# Patient Record
Sex: Male | Born: 1940 | Race: White | Hispanic: No | Marital: Married | Smoking: Never smoker
Health system: Southern US, Community
[De-identification: ages and names within clinical notes are randomized; demographics above are authoritative.]

## PROBLEM LIST (undated history)

## (undated) DIAGNOSIS — I2510 Atherosclerotic heart disease of native coronary artery without angina pectoris: Secondary | ICD-10-CM

## (undated) DIAGNOSIS — I480 Paroxysmal atrial fibrillation: Secondary | ICD-10-CM

## (undated) DIAGNOSIS — Z961 Presence of intraocular lens: Secondary | ICD-10-CM

## (undated) DIAGNOSIS — Z7901 Long term (current) use of anticoagulants: Secondary | ICD-10-CM

## (undated) DIAGNOSIS — H3320 Serous retinal detachment, unspecified eye: Secondary | ICD-10-CM

## (undated) DIAGNOSIS — E785 Hyperlipidemia, unspecified: Secondary | ICD-10-CM

## (undated) DIAGNOSIS — I4892 Unspecified atrial flutter: Secondary | ICD-10-CM

## (undated) DIAGNOSIS — I10 Essential (primary) hypertension: Secondary | ICD-10-CM

## (undated) DIAGNOSIS — IMO0001 Reserved for inherently not codable concepts without codable children: Secondary | ICD-10-CM

## (undated) DIAGNOSIS — I219 Acute myocardial infarction, unspecified: Secondary | ICD-10-CM

## (undated) HISTORY — PX: TONSILLECTOMY AND ADENOIDECTOMY: SUR1326

## (undated) HISTORY — DX: Long term (current) use of anticoagulants: Z79.01

## (undated) HISTORY — DX: Paroxysmal atrial fibrillation: I48.0

## (undated) HISTORY — DX: Hyperlipidemia, unspecified: E78.5

## (undated) HISTORY — DX: Atherosclerotic heart disease of native coronary artery without angina pectoris: I25.10

## (undated) HISTORY — DX: Unspecified atrial flutter: I48.92

## (undated) HISTORY — DX: Presence of intraocular lens: Z96.1

## (undated) HISTORY — DX: Acute myocardial infarction, unspecified: I21.9

## (undated) HISTORY — DX: Essential (primary) hypertension: I10

## (undated) HISTORY — DX: Serous retinal detachment, unspecified eye: H33.20

## (undated) HISTORY — DX: Reserved for inherently not codable concepts without codable children: IMO0001

---

## 1993-12-24 DIAGNOSIS — I219 Acute myocardial infarction, unspecified: Secondary | ICD-10-CM

## 1993-12-24 HISTORY — DX: Acute myocardial infarction, unspecified: I21.9

## 1994-12-24 HISTORY — PX: CARDIAC CATHETERIZATION: SHX172

## 1994-12-24 HISTORY — PX: CORONARY ARTERY BYPASS GRAFT: SHX141

## 2004-09-15 ENCOUNTER — Ambulatory Visit (HOSPITAL_COMMUNITY): Admission: RE | Admit: 2004-09-15 | Discharge: 2004-09-16 | Payer: Self-pay | Admitting: Ophthalmology

## 2004-12-24 DIAGNOSIS — IMO0001 Reserved for inherently not codable concepts without codable children: Secondary | ICD-10-CM

## 2004-12-24 HISTORY — DX: Reserved for inherently not codable concepts without codable children: IMO0001

## 2006-12-15 ENCOUNTER — Emergency Department (HOSPITAL_COMMUNITY): Admission: EM | Admit: 2006-12-15 | Discharge: 2006-12-15 | Payer: Self-pay | Admitting: Emergency Medicine

## 2006-12-18 ENCOUNTER — Encounter (HOSPITAL_COMMUNITY): Admission: RE | Admit: 2006-12-18 | Discharge: 2007-03-05 | Payer: Self-pay | Admitting: Emergency Medicine

## 2006-12-22 ENCOUNTER — Emergency Department (HOSPITAL_COMMUNITY): Admission: EM | Admit: 2006-12-22 | Discharge: 2006-12-22 | Payer: Self-pay | Admitting: Emergency Medicine

## 2006-12-29 ENCOUNTER — Emergency Department (HOSPITAL_COMMUNITY): Admission: EM | Admit: 2006-12-29 | Discharge: 2006-12-29 | Payer: Self-pay | Admitting: Emergency Medicine

## 2007-01-12 ENCOUNTER — Emergency Department (HOSPITAL_COMMUNITY): Admission: EM | Admit: 2007-01-12 | Discharge: 2007-01-12 | Payer: Self-pay | Admitting: Emergency Medicine

## 2010-09-04 ENCOUNTER — Ambulatory Visit: Payer: Self-pay | Admitting: Cardiovascular Disease

## 2010-09-08 ENCOUNTER — Ambulatory Visit: Payer: Self-pay | Admitting: Cardiovascular Disease

## 2011-03-27 ENCOUNTER — Other Ambulatory Visit: Payer: Self-pay | Admitting: *Deleted

## 2011-03-27 ENCOUNTER — Telehealth: Payer: Self-pay | Admitting: Cardiovascular Disease

## 2011-03-27 MED ORDER — POTASSIUM CHLORIDE CRYS ER 20 MEQ PO TBCR: 20.0000 meq | EXTENDED_RELEASE_TABLET | Freq: Every day | ORAL | Status: DC

## 2011-03-27 NOTE — Telephone Encounter (Signed)
Requesting Klor-Con 20 meq #90-wants to pick up RX on Friday. Also wants nurse to look in his chart and call him to set up labs and ov.

## 2011-03-27 NOTE — Telephone Encounter (Signed)
Attempted to reach the pt to make an appt and lab work.  L/M to call back.

## 2011-03-27 NOTE — Telephone Encounter (Signed)
Pt called back for an appt. And 6mov .  This was done

## 2011-03-27 NOTE — Telephone Encounter (Signed)
Refill for potassium 20 meq #90;   Pt will pick up

## 2011-04-16 ENCOUNTER — Other Ambulatory Visit: Payer: Self-pay | Admitting: *Deleted

## 2011-04-16 DIAGNOSIS — E7800 Pure hypercholesterolemia, unspecified: Secondary | ICD-10-CM

## 2011-04-23 ENCOUNTER — Telehealth: Payer: Self-pay | Admitting: *Deleted

## 2011-04-23 ENCOUNTER — Other Ambulatory Visit (INDEPENDENT_AMBULATORY_CARE_PROVIDER_SITE_OTHER): Payer: PRIVATE HEALTH INSURANCE | Admitting: *Deleted

## 2011-04-23 DIAGNOSIS — E7800 Pure hypercholesterolemia, unspecified: Secondary | ICD-10-CM

## 2011-04-23 LAB — LIPID PANEL
Cholesterol: 122 mg/dL (ref 0–200)
VLDL: 21.4 mg/dL (ref 0.0–40.0)

## 2011-04-23 LAB — BASIC METABOLIC PANEL
BUN: 23 mg/dL (ref 6–23)
Calcium: 9.2 mg/dL (ref 8.4–10.5)
Chloride: 105 mEq/L (ref 96–112)
GFR: 76.27 mL/min (ref >60.00)
Glucose, Bld: 96 mg/dL (ref 70–99)
Potassium: 4.2 mEq/L (ref 3.5–5.1)

## 2011-04-23 LAB — HEPATIC FUNCTION PANEL
AST: 30 U/L (ref 0–37)
Alkaline Phosphatase: 67 U/L (ref 39–117)

## 2011-04-23 NOTE — Telephone Encounter (Signed)
Patient called with lab results. Xavier Jatniel Verastegui RN  

## 2011-04-23 NOTE — Telephone Encounter (Signed)
Message copied by Mahalia Longest on Mon Apr 23, 2011  4:59 PM ------      Message from: [place], [place]      Created: Mon Apr 23, 2011  3:02 PM       HDL remains a bit low.  Will discuss at his next ov.

## 2011-04-30 ENCOUNTER — Encounter: Payer: Self-pay | Admitting: Cardiovascular Disease

## 2011-04-30 DIAGNOSIS — I219 Acute myocardial infarction, unspecified: Secondary | ICD-10-CM | POA: Insufficient documentation

## 2011-04-30 DIAGNOSIS — I2510 Atherosclerotic heart disease of native coronary artery without angina pectoris: Secondary | ICD-10-CM | POA: Insufficient documentation

## 2011-04-30 DIAGNOSIS — E785 Hyperlipidemia, unspecified: Secondary | ICD-10-CM | POA: Insufficient documentation

## 2011-04-30 DIAGNOSIS — H3320 Serous retinal detachment, unspecified eye: Secondary | ICD-10-CM | POA: Insufficient documentation

## 2011-04-30 DIAGNOSIS — Z961 Presence of intraocular lens: Secondary | ICD-10-CM | POA: Insufficient documentation

## 2011-04-30 DIAGNOSIS — I480 Paroxysmal atrial fibrillation: Secondary | ICD-10-CM | POA: Insufficient documentation

## 2011-04-30 DIAGNOSIS — I4891 Unspecified atrial fibrillation: Secondary | ICD-10-CM | POA: Insufficient documentation

## 2011-04-30 DIAGNOSIS — I10 Essential (primary) hypertension: Secondary | ICD-10-CM | POA: Insufficient documentation

## 2011-05-04 ENCOUNTER — Ambulatory Visit (INDEPENDENT_AMBULATORY_CARE_PROVIDER_SITE_OTHER): Payer: PRIVATE HEALTH INSURANCE | Admitting: Cardiovascular Disease

## 2011-05-04 ENCOUNTER — Encounter: Payer: Self-pay | Admitting: Cardiovascular Disease

## 2011-05-04 VITALS — BP 138/82 | HR 70 | Wt 157.0 lb

## 2011-05-04 DIAGNOSIS — E785 Hyperlipidemia, unspecified: Secondary | ICD-10-CM

## 2011-05-04 DIAGNOSIS — I2510 Atherosclerotic heart disease of native coronary artery without angina pectoris: Secondary | ICD-10-CM

## 2011-05-04 NOTE — Assessment & Plan Note (Signed)
His coronary artery disease remained stable. His grafts are now 72 years old. We'll plan on doing a stress Myoview study in the next several years.

## 2011-05-04 NOTE — Assessment & Plan Note (Signed)
His most recent lipids his HDL still fairly well. I've encouraged him to exercise on a regular basis. I've asked him to take his Niaspan every night.

## 2011-05-04 NOTE — Progress Notes (Signed)
Xavier Bell Date of Birth  07/05/1941 [facility] [address]     [address] [place], [place]  [address] [phone]  Fax  [phone]  History of Present Illness:  Xavier Bell is a middle-age gentleman with a history of coronary artery disease. He is status post coronary artery bypass grafting in 1996. He is remaining quite active. [name] not had any episodes of chest pain or shortness of breath.  He has not been exercising as much as he would like. He works up in [place]. 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 He does stay fairly active. He also is not been taking his Niaspan as regularly as he would like because of the flushing that he gets.  Current Outpatient Prescriptions on File Prior to Visit  Medication Sig Dispense Refill  . amLODipine (NORVASC) 5 MG tablet Take 5 mg by mouth daily.        . Ascorbic Acid (VITAMIN C PO) Take by mouth daily.        [name] aspirin 325 MG tablet Take 325 mg by mouth daily.        [name] atorvastatin (LIPITOR) 40 MG tablet Take 40 mg by mouth daily.        . carvedilol (COREG) 12.5 MG tablet Take 12.5 mg by mouth 2 (two) times daily with a meal.        . Cholecalciferol (VITAMIN D) 1000 UNITS capsule Take 5,000 Units by mouth daily.        . fenofibrate (TRICOR) 145 MG tablet Take 145 mg by mouth daily.        . fish oil-omega-3 fatty acids 1000 MG capsule Take by mouth daily.        . hydrochlorothiazide 25 MG tablet Take 25 mg by mouth daily.        . Multiple Vitamin (MULTIVITAMIN) tablet Take 1 tablet by mouth daily.        . niacin (NIASPAN) 1000 MG CR tablet Take 1,000 mg by mouth at bedtime. Taking occ.      . potassium chloride SA (K-DUR,KLOR-CON) 20 MEQ tablet Take 1 tablet (20 mEq total) by mouth daily.  90 tablet  3  . telmisartan (MICARDIS) 80 MG tablet Take 80 mg by mouth daily.        . Zinc 50 MG CAPS Take by mouth daily.        [name] DISCONTD: potassium chloride (KLOR-CON) 20 MEQ  packet Take 20 mEq by mouth daily.          No Known Allergies  Past Medical History  Diagnosis Date  . Coronary artery disease   . Hypertension   . Dyslipidemia   . MI (myocardial infarction) 1995  . Retinal detachment   . Pseudoaphakia     both eyes  . Atrial fibrillation     Past Surgical History  Procedure Date  . Coronary artery bypass graft 1996  . Cardiac catheterization 1996  . Tonsillectomy and adenoidectomy     History  Smoking status  . Never Smoker   Smokeless tobacco  . Not on file    History  Alcohol Use No    Family History  Problem Relation Age of Onset  . Coronary artery disease      Reviw of Systems:  Reviewed in the HPI.  All other systems are negative.  Physical Exam: BP 138/82  Pulse 70  Wt 157 lb (71.215 kg) The patient is  alert and oriented x 3.  The mood and affect are normal.  The skin is warm and dry.  Color is normal.  The HEENT exam reveals that the sclera are nonicteric.  The mucous membranes are moist.  The carotids are 2+ without bruits.  There is no thyromegaly.  There is no JVD.  The lungs are clear.  The chest wall is non tender.  The heart exam reveals a regular rate with a normal S1 and S2.  There are no murmurs, gallops, or rubs.  The PMI is not displaced.   Abdominal exam reveals good bowel sounds.  There is no guarding or rebound.  There is no hepatosplenomegaly or tenderness.  There are no masses.  Exam of the legs reveal no clubbing, cyanosis, or edema.  The legs are without rashes.  The distal pulses are intact.  Cranial nerves II - XII are intact.  Motor and sensory functions are intact.  The gait is normal.  Assessment / Plan:

## 2011-05-11 NOTE — Op Note (Signed)
NAMEHENLEY, BOETTNER            ACCOUNT NO.:  [id number]   MEDICAL RECORD NO.:  [id number]          PATIENT TYPE:  OIB   LOCATION:  NA                           FACILITY:  [facility]   PHYSICIAN:  Guadelupe Sabin, M.D.DATE OF BIRTH:  July 21, 1941   DATE OF PROCEDURE:  09/15/2004  DATE OF DISCHARGE:                                 OPERATIVE REPORT   PREOPERATIVE DIAGNOSIS:  Rhegmatogenous retinal detachment, left eye.   POSTOPERATIVE DIAGNOSIS:  Rhegmatogenous retinal detachment, left eye.   OPERATION PERFORMED:  Scleral buckling procedure, left eye using solid  silicone implants, #279 and 240, with cryo application, diathermy  application and paracentesis.   SURGEON:  Guadelupe Sabin, M.D.   ASSISTANT:  Nurse.   ANESTHESIA:  General.   OPHTHALMOSCOPY:  As previously described.   DESCRIPTION OF PROCEDURE:  After the patient was prepped and draped, a lid  speculum was inserted in the left eye and lid traction sutures placed with 4-  0 silk.  A peritomy was then performed along the edge of the corneal limbus  360 degrees.  The subconjunctival tissue was cleaned and traction sutures  placed around the rectus muscles.  The sclera was inspected and felt to be  in satisfactory condition for lamellar scleral dissection.  The large  horseshoe retinal tear was localized at the 3 o'clock position as previously  drawn.  Cryo applications were applied around its site.  It was then elected  to perform lamellar scleral dissection from the 1:30 to 4:30 position, the  bed measuring 11 mm in width.  A total of four 4-0 green Mersilene sutures  were placed across the scleral flaps after diathermy applications had been  applied.  Initially, a #277 solid silicone implant was inserted but later  this was felt to be too narrow and was replaced with a wider #279.  A #240  solid silicone encircling band was placed about the globe, tied with a  suture at the 8:30 position.  Anchoring sutures were  placed of 5-0 white  Dacron at the 8 and 10 o'clock position.  After repeat indirect  ophthalmoscopy, the horseshoe tear appeared to be in good position and a  tiny round retinal hole with operculum which was directly behind the implant  was noted, necessitating the wider implant as discussed above.  The lamellar  dissection was widened from 9 to 11 mm in width to accommodate the wider  implant.  The patient had been given Diamox 500 mg intravenously at the  beginning of the procedure as there was minimal subretinal fluid present.  Continued inspection of the eye revealed good positioning of the horseshoe  tear and round hole over the implant surface.  Since there was only minimal  retinal fluid present, it was felt that this patient on aspirin should not  have the choroid perforated fearing hemorrhage.  It was then elected to do a  paracentesis to lower the intraocular pressure further.  The tension of the  encircling band was adjusted and the scleral flaps pulled up.  An additional  paracentesis was performed and 0.3 mL of 100% C3F8 were  injected through the  pars plana at the 11 o'clock position.  A good intravitreous gas bubble was  achieved.  The tension of the eye increased and a further additional  paracentesis performed.  The perfusion of the optic nerve retinal blood  vessels, however, was monitored and felt to be satisfactory.  It was then  elected to close.  Tenon's capsule was pulled forward in the four quadrants  using separate layer after the four 4-0 green Mersilene sutures which had  been placed in the scleral flaps had been pulled up and tied securely with 3-  0 plain cat gut reinforcements.  The conjunctiva was then closed with a  running 6-0 chromic catgut suture.  Depo-Garamycin and Celestone were  injected in the subtenon's space and Neosporin was irrigated on the ocular  corneal surface and around the implant area during the latter portion of the  procedure.  A light  patch and protector shield were applied to the operated  eye.  The patient was placed postoperatively on his right side to allow the  intravitreous air bubble to tamponade the retinal breaks at the 3 o'clock  position.  Duration of the procedure was 1-3/4 hours.  The patient tolerated  the procedure well in general and left the operating room for the recovery  room in good condition.       HNJ/MEDQ  D:  09/15/2004  T:  09/16/2004  Job:  [id number]   cc:   Loraine Leriche T. Nile Riggs, M.D.  Fax: [phone]

## 2011-05-11 NOTE — H&P (Signed)
Xavier Bell, Xavier Bell            ACCOUNT NO.:  [id number]   MEDICAL RECORD NO.:  [id number]          PATIENT TYPE:  OIB   LOCATION:  NA                           FACILITY:  [facility]   PHYSICIAN:  Guadelupe Sabin, M.D.DATE OF BIRTH:  1941-05-08   DATE OF ADMISSION:  DATE OF DISCHARGE:                                HISTORY & PHYSICAL   This was an urgent outpatient admission of this 70 year old white male  admitted with a rhegmatogenous retinal detachment of the left eye.   HISTORY OF PRESENT ILLNESS:  This patient had uneventful previous cataract  implant surgery by Loraine Leriche T. Nile Riggs, M.D., his regular ophthalmologist, left  eye, on December 01, 2003.  The patient did well until a few weeks ago, when  he began to note the onset of blurred vision in the left eye and loss of  vision with a visual field defect.  The patient was seen by Dr. Nile Riggs, who  made the diagnosis of a retinal detachment and referred the patient to my  office.  This diagnosis was confirmed and arrangements made for the  patient's admission and surgery.  The patient was given oral discussion and  printed information concerning the procedure and its possible complications.  He signed an informed consent.   PAST MEDICAL HISTORY:  The patient is under the care of Antony Madura,  M.D., and Vesta Mixer, M.D., his cardiologist.  The patient has had a  previous myocardial infarction in 1995 and coronary artery bypass surgery in  1996.  The patient has done well following that procedure and has been  taking only aspirin 325 mg a day.  He denies any specific cardiorespiratory  complaints at the present time.  His other medications include lisinopril,  potassium chloride, metoprolol, hydrochlorothiazide, and Cardizem, along  with multivitamins, folic acid, and vitamin C.   PHYSICAL EXAMINATION:  VITAL SIGNS:  As recorded on admission, blood  pressure 153/83, pulse 49, respirations 16, temperature 97.3.  GENERAL:   The patient is a pleasant, well-nourished, well-developed 63-year-  old white male in acute ocular distress.  HEENT:  Eyes:  Visual acuity 20/25 with correction, right eye, 20/400 left  eye with correction.  Applanation tonometry 18 mm right eye, 20 mm left eye.  Slit lamp examination revealed the eyes to be wide and clear with a clear  cornea, deep and clear anterior chamber.  A posterior chamber implant is  present in both eyes.  The posterior capsule appears open in the right eye  and slightly hazy in the left eye.  CHEST:  Lungs clear to percussion and auscultation.  CARDIAC:  Normal sinus rhythm, no cardiomegaly, no murmurs.  ABDOMEN:  Negative.  EXTREMITIES:  Negative.   ADMISSION DIAGNOSES:  1.  Rhegmatogenous retinal detachment, left eye.  2.  Pseudophakia, both eyes.   SECONDARY DIAGNOSIS:  1.  Coronary artery disease, status post coronary artery bypass surgery.       HNJ/MEDQ  D:  09/15/2004  T:  09/16/2004  Job:  [id number]   cc:   Loraine Leriche T. Nile Riggs, M.D.  Fax: [phone]   Vesta Mixer,  M.D.  [address], [address]  [place]  [place] [address]  Fax: [phone]   Antony Madura, M.D.  [address], [address]  [place]  [place] [address]  Fax: [phone]

## 2011-05-28 ENCOUNTER — Telehealth: Payer: Self-pay | Admitting: Cardiovascular Disease

## 2011-05-28 MED ORDER — NIACIN ER (ANTIHYPERLIPIDEMIC) 1000 MG PO TBCR: EXTENDED_RELEASE_TABLET | ORAL | Status: DC

## 2011-05-28 NOTE — Telephone Encounter (Signed)
Pt called, 1000mg  tab ordered vs two 500mg  tabs, pt aware and to call back with any questions or concerns. Pt verbalized understanding. Alfonso Ramus RN

## 2011-05-28 NOTE — Telephone Encounter (Signed)
Pt wants a refill niospan er 500 mg 90 day supply [facility] He said he would pick up script  Just let him know

## 2011-06-04 ENCOUNTER — Telehealth: Payer: Self-pay | Admitting: *Deleted

## 2011-06-04 NOTE — Telephone Encounter (Signed)
Pt walked in making sure escribe sent to mail order for med refill. Pharmacy called and is on its way, was shipped 5 days ago.

## 2011-10-22 ENCOUNTER — Other Ambulatory Visit: Payer: Self-pay | Admitting: Cardiovascular Disease

## 2011-10-22 ENCOUNTER — Other Ambulatory Visit: Payer: Self-pay | Admitting: *Deleted

## 2011-10-22 MED ORDER — TELMISARTAN 80 MG PO TABS: 80.0000 mg | ORAL_TABLET | Freq: Every day | ORAL | Status: DC

## 2011-10-22 NOTE — Telephone Encounter (Signed)
Fax Received. Refill Completed. Estuardo Frisbee Chowoe (M.A)  

## 2011-10-22 NOTE — Telephone Encounter (Signed)
Pt calling wanting 90 day supply of micardis called in. Please call back to inform RX has been called in.

## 2011-11-05 ENCOUNTER — Other Ambulatory Visit (INDEPENDENT_AMBULATORY_CARE_PROVIDER_SITE_OTHER): Payer: PRIVATE HEALTH INSURANCE | Admitting: *Deleted

## 2011-11-05 DIAGNOSIS — E785 Hyperlipidemia, unspecified: Secondary | ICD-10-CM

## 2011-11-06 LAB — HEPATIC FUNCTION PANEL
AST: 31 U/L (ref 0–37)
Total Bilirubin: 0.9 mg/dL (ref 0.3–1.2)
Total Protein: 6.7 g/dL (ref 6.0–8.3)

## 2011-11-06 LAB — LIPID PANEL
Cholesterol: 100 mg/dL (ref 0–200)
HDL: 35 mg/dL — ABNORMAL LOW (ref >39.00)
Triglycerides: 92 mg/dL (ref 0.0–149.0)

## 2011-11-06 LAB — BASIC METABOLIC PANEL
BUN: 25 mg/dL — ABNORMAL HIGH (ref 6–23)
CO2: 26 mEq/L (ref 19–32)
Calcium: 9.4 mg/dL (ref 8.4–10.5)
Chloride: 110 mEq/L (ref 96–112)
Creatinine, Ser: 1.1 mg/dL (ref 0.4–1.5)
GFR: 68.36 mL/min (ref >60.00)
Potassium: 4 mEq/L (ref 3.5–5.1)

## 2011-11-09 ENCOUNTER — Ambulatory Visit (INDEPENDENT_AMBULATORY_CARE_PROVIDER_SITE_OTHER): Payer: PRIVATE HEALTH INSURANCE | Admitting: Cardiovascular Disease

## 2011-11-09 ENCOUNTER — Encounter: Payer: Self-pay | Admitting: Cardiovascular Disease

## 2011-11-09 VITALS — BP 142/80 | HR 66 | Ht 67.0 in | Wt 159.1 lb

## 2011-11-09 DIAGNOSIS — I4891 Unspecified atrial fibrillation: Secondary | ICD-10-CM

## 2011-11-09 DIAGNOSIS — I4892 Unspecified atrial flutter: Secondary | ICD-10-CM | POA: Insufficient documentation

## 2011-11-09 NOTE — Assessment & Plan Note (Signed)
Branch has a history of atrial fibrillation in the remote past. [name] had a history of coronary artery bypass grafting. He also has a history of hypertension and mildly depressed left ventricular systolic function. He now presents with no symptoms but was found to have atrial flutter on exam.  At this point I don't think that we need any additional rate control. He seems to be fairly comfortable so do not necessary think that he needs to be cardioverted.  We do need to start him on a anticoagulant. We discussed Coumadin, Xarelto, and Pradaxa.  He'll call his insurance company see which of these medications is covered. He travels to and from [place] every week and so getting protimes drawn will be quite a challenge although it would be possible.  Given him the okay to go ahead and exercise.

## 2011-11-09 NOTE — Patient Instructions (Signed)
Your physician recommends that you schedule a follow-up appointment in: 1-2 months  Your physician has requested that you have an echocardiogram. Echocardiography is a painless test that uses sound waves to create images of your heart. It provides your doctor with information about the size and shape of your heart and how well your heart's chambers and valves are working. This procedure takes approximately one hour. There are no restrictions for this procedure. NEED ON A Friday AND WITHIN 2 weeks please   You need to go on either, xarelto 20 mg a day pradaxa 150 mg two times a day Coumadin daily with monthly lab draws, more frequent at first.

## 2011-11-09 NOTE — Progress Notes (Signed)
Estil Daft Date of Birth  [birth year] [facility] [address]    [address] [place], [place]  [address] [phone]  Fax  [phone]  History of Present Illness:  Xavier Bell is a 70 y.o. gentleman with a Hx of CAD - S/P CABG in 1996. He also has a Hx of HTN and dyslipidemia.  He feels quite well. [name] not having any episodes of chest pain or shortness breath. He works up in [place] and drives down to [place] [place] every week since he still lives here.  He has not noticed any decrease excess capacity.    Current Outpatient Prescriptions on File Prior to Visit  Medication Sig Dispense Refill  . amLODipine (NORVASC) 5 MG tablet Take 5 mg by mouth daily.        [name] aspirin 325 MG tablet Take 325 mg by mouth daily.        [name] atorvastatin (LIPITOR) 40 MG tablet Take 40 mg by mouth daily.        . carvedilol (COREG) 12.5 MG tablet Take 12.5 mg by mouth 2 (two) times daily with a meal.        . Cholecalciferol (VITAMIN D) 1000 UNITS capsule Take 5,000 Units by mouth daily.        . fenofibrate (TRICOR) 145 MG tablet Take 145 mg by mouth daily.        . fish oil-omega-3 fatty acids 1000 MG capsule Take by mouth daily.        . hydrochlorothiazide 25 MG tablet Take 25 mg by mouth daily.        . Multiple Vitamin (MULTIVITAMIN) tablet Take 1 tablet by mouth daily.        . potassium chloride SA (K-DUR,KLOR-CON) 20 MEQ tablet Take 1 tablet (20 mEq total) by mouth daily.  90 tablet  3  . telmisartan (MICARDIS) 80 MG tablet Take 1 tablet (80 mg total) by mouth daily.  90 tablet  2  . Zinc 50 MG CAPS Take by mouth daily.        . Ascorbic Acid (VITAMIN C PO) Take by mouth daily.          No Known Allergies  Past Medical History  Diagnosis Date  . Coronary artery disease   . Hypertension   . Dyslipidemia   . MI (myocardial infarction) 1995  . Retinal detachment   . Pseudoaphakia     both eyes  . Atrial fibrillation     Past Surgical History  Procedure Date  . Coronary  artery bypass graft 1996  . Cardiac catheterization 1996  . Tonsillectomy and adenoidectomy     History  Smoking status  . Never Smoker   Smokeless tobacco  . Not on file    History  Alcohol Use No    Family History  Problem Relation Age of Onset  . Coronary artery disease      Reviw of Systems:  Reviewed in the HPI.  All other systems are negative.  Physical Exam: BP 142/80  Pulse 66  Ht 5\' 7"  (1.702 m)  Wt 159 lb 1.9 oz (72.176 kg)  BMI 24.92 kg/m2 The patient is alert and oriented x 3.  The mood and affect are normal.   Skin: warm and dry.  Color is normal.    HEENT:   Normocephalic/atraumatic. He has normal carotids. There is no JVD. His mucous membranes are moist. His neck is supple.  Lungs: Lungs are clear.   Heart: RR, 2/6 systolic murmur.  Abdomen: His abdomen is soft it is good bowel sounds   Extremities:  no clubbing cyanosis or edema   Neuro:  Exam is nonfocal    ECG: Atrial flutter with 4-1 conduction. His right vaccination.  Assessment / Plan:

## 2011-11-12 ENCOUNTER — Telehealth: Payer: Self-pay | Admitting: Cardiovascular Disease

## 2011-11-12 DIAGNOSIS — I4892 Unspecified atrial flutter: Secondary | ICD-10-CM

## 2011-11-12 DIAGNOSIS — Z8669 Personal history of other diseases of the nervous system and sense organs: Secondary | ICD-10-CM | POA: Insufficient documentation

## 2011-11-12 MED ORDER — RIVAROXABAN 20 MG PO TABS: 20.0000 mg | ORAL_TABLET | Freq: Every day | ORAL | Status: DC

## 2011-11-12 NOTE — Telephone Encounter (Signed)
F/up to previous problem: Pt was here on Friday He was told to call back with this medicine name Pt wants xarelto [facility]

## 2011-11-12 NOTE — Telephone Encounter (Signed)
Script sent/pt informed

## 2011-11-23 ENCOUNTER — Ambulatory Visit (HOSPITAL_COMMUNITY): Payer: PRIVATE HEALTH INSURANCE | Attending: Cardiology | Admitting: Radiology

## 2011-11-23 DIAGNOSIS — I4892 Unspecified atrial flutter: Secondary | ICD-10-CM

## 2011-11-23 DIAGNOSIS — I059 Rheumatic mitral valve disease, unspecified: Secondary | ICD-10-CM | POA: Insufficient documentation

## 2011-11-23 DIAGNOSIS — E785 Hyperlipidemia, unspecified: Secondary | ICD-10-CM | POA: Insufficient documentation

## 2011-11-23 DIAGNOSIS — I4891 Unspecified atrial fibrillation: Secondary | ICD-10-CM | POA: Insufficient documentation

## 2011-11-23 DIAGNOSIS — I2510 Atherosclerotic heart disease of native coronary artery without angina pectoris: Secondary | ICD-10-CM | POA: Insufficient documentation

## 2011-11-23 DIAGNOSIS — I252 Old myocardial infarction: Secondary | ICD-10-CM | POA: Insufficient documentation

## 2011-11-23 DIAGNOSIS — I079 Rheumatic tricuspid valve disease, unspecified: Secondary | ICD-10-CM | POA: Insufficient documentation

## 2011-12-04 ENCOUNTER — Telehealth: Payer: Self-pay | Admitting: Cardiovascular Disease

## 2011-12-04 NOTE — Telephone Encounter (Signed)
Called pharmacy and his fenofibrate will be waived and refilled but in January we will need to dc hctz and micardis and start valsartan/hctz 320/25, pt will call me when he runs out of meds, all due to tier change.

## 2011-12-04 NOTE — Telephone Encounter (Signed)
Called pt and informed i will call pharmacy to check on tricor or fenofibrate refill and get back with him, also his insurance will not cover 100% his micardis and dr Elease Hashimoto will switch meds, orders followed.

## 2011-12-04 NOTE — Telephone Encounter (Signed)
FU Call: Pt returning call to Jodette. Please return pt call to discuss further.  

## 2011-12-04 NOTE — Telephone Encounter (Signed)
Dr Elease Hashimoto to advise

## 2011-12-04 NOTE — Telephone Encounter (Signed)
Pt calling re Tricor no longer available at his pharmacy, can substitute for something else?

## 2011-12-31 ENCOUNTER — Encounter: Payer: Self-pay | Admitting: Cardiovascular Disease

## 2011-12-31 ENCOUNTER — Ambulatory Visit: Payer: PRIVATE HEALTH INSURANCE | Admitting: Cardiovascular Disease

## 2011-12-31 ENCOUNTER — Ambulatory Visit (INDEPENDENT_AMBULATORY_CARE_PROVIDER_SITE_OTHER): Payer: PRIVATE HEALTH INSURANCE | Admitting: Cardiovascular Disease

## 2011-12-31 VITALS — BP 148/85 | HR 67 | Ht 67.0 in | Wt 160.1 lb

## 2011-12-31 DIAGNOSIS — I10 Essential (primary) hypertension: Secondary | ICD-10-CM

## 2011-12-31 DIAGNOSIS — E559 Vitamin D deficiency, unspecified: Secondary | ICD-10-CM

## 2011-12-31 DIAGNOSIS — I4892 Unspecified atrial flutter: Secondary | ICD-10-CM

## 2011-12-31 HISTORY — DX: Vitamin D deficiency, unspecified: E55.9

## 2011-12-31 HISTORY — DX: Essential (primary) hypertension: I10

## 2011-12-31 MED ORDER — LOSARTAN POTASSIUM 100 MG PO TABS: 100.0000 mg | ORAL_TABLET | Freq: Every day | ORAL | Status: DC

## 2011-12-31 MED ORDER — CARVEDILOL 25 MG PO TABS: 25.0000 mg | ORAL_TABLET | Freq: Two times a day (BID) | ORAL | Status: DC

## 2011-12-31 MED ORDER — VITAMIN D 1000 UNITS PO CAPS: 5000.0000 [IU] | ORAL_CAPSULE | ORAL | Status: DC

## 2011-12-31 NOTE — Progress Notes (Signed)
Estil Daft Date of Birth  12-Jun-1941 [facility] [address]    [address] [place], [place]  [address] [phone]  Fax  [phone]  History of Present Illness:  Xavier Bell is a 71 y.o. gentleman with a Hx of CAD - S/P CABG in 1996. He also has a Hx of HTN and dyslipidemia.  He feels quite well. [name] not having any episodes of chest pain or shortness breath. He works up in [place] and drives down to [place] [place] every week since he still lives here.  He has not noticed any decrease excess capacity.    He was incidentally diagnosed with atrial flutter during his last visit. We started him on LXarelto. His rate has been well controlled. 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  Current Outpatient Prescriptions on File Prior to Visit  Medication Sig Dispense Refill  . amLODipine (NORVASC) 5 MG tablet Take 5 mg by mouth daily.        [name] aspirin 325 MG tablet Take 325 mg by mouth daily.        [name] atorvastatin (LIPITOR) 40 MG tablet Take 40 mg by mouth daily.        . fenofibrate (TRICOR) 145 MG tablet Take 145 mg by mouth daily.        . fish oil-omega-3 fatty acids 1000 MG capsule Take by mouth daily.        . hydrochlorothiazide 25 MG tablet Take 25 mg by mouth daily.        . Multiple Vitamin (MULTIVITAMIN) tablet Take 1 tablet by mouth daily.        . niacin (NIASPAN) 1000 MG CR tablet Take 1,000 mg by mouth as needed. One po at bedtime       . potassium chloride SA (K-DUR,KLOR-CON) 20 MEQ tablet Take 1 tablet (20 mEq total) by mouth daily.  90 tablet  3  . Rivaroxaban (XARELTO) 20 MG TABS Take 20 mg by mouth daily.  90 tablet  3  . Zinc 50 MG CAPS Take by mouth daily.          No Known Allergies  Past Medical History  Diagnosis Date  . Coronary artery disease   . Hypertension   . Dyslipidemia   . MI (myocardial infarction) 1995  . Retinal detachment   . Pseudoaphakia     both eyes  . Atrial fibrillation      Past Surgical History  Procedure Date  . Coronary artery bypass graft 1996  . Cardiac catheterization 1996  . Tonsillectomy and adenoidectomy     History  Smoking status  . Never Smoker   Smokeless tobacco  . Not on file    History  Alcohol Use No    Family History  Problem Relation Age of Onset  . Coronary artery disease      Reviw of Systems:  Reviewed in the HPI.  All other systems are negative.  Physical Exam: BP 148/85  Pulse 67  Ht 5\' 7"  (1.702 m)  Wt 160 lb 1.9 oz (72.63 kg)  BMI 25.08 kg/m2 The patient is alert and oriented x 3.  The mood and affect are normal.   Skin: warm and dry.  Color is normal.    HEENT:   Normocephalic/atraumatic. He has normal carotids. There is no JVD. His mucous membranes are moist. His neck is supple.  Lungs: Lungs are clear.   Heart: RR, 2/6 systolic murmur.  Abdomen: His abdomen is soft it is good bowel sounds   Extremities:  no clubbing cyanosis or edema   Neuro:  Exam is nonfocal    ECG:  Assessment / Plan:

## 2011-12-31 NOTE — Patient Instructions (Signed)
Your physician recommends that you schedule a follow-up appointment in: 1 month  Your physician recommends that you return for lab work in: 1 month  Your physician has recommended you make the following change in your medication:   Stop micardis  Reduce vit D to 5000 iu every other day  Increase coreg to 25 mg one tablet twice a day  Start losartan 100 mg daily.

## 2011-12-31 NOTE — Assessment & Plan Note (Signed)
His vitamin D level was 25.7 in August of 2010. [name] been on vitamin E since that time. Currently taking 5000 international units a day. We will decrease his dose to 5000 international units every other day.

## 2011-12-31 NOTE — Assessment & Plan Note (Signed)
His blood pressure is fairly well controlled. He is currently on Micardis and tolerates it very well. Unfortunately, his insurance, he is no longer covers Micardis. We'll discontinue the Micardis and start him on losartan 100 mg a day.

## 2011-12-31 NOTE — Assessment & Plan Note (Signed)
His atrial flutter is persistent. His rate is well controlled. We will increase his carvedilol to 25 mg twice a day which will improve her rate control and will also help his hypertension. I'll see him again in one month. We'll try to schedule a  cardioversion on a  Friday morning.

## 2012-01-31 ENCOUNTER — Ambulatory Visit: Payer: PRIVATE HEALTH INSURANCE | Admitting: Cardiovascular Disease

## 2012-01-31 ENCOUNTER — Other Ambulatory Visit: Payer: PRIVATE HEALTH INSURANCE | Admitting: *Deleted

## 2012-02-01 ENCOUNTER — Other Ambulatory Visit: Payer: PRIVATE HEALTH INSURANCE | Admitting: *Deleted

## 2012-02-01 ENCOUNTER — Ambulatory Visit (INDEPENDENT_AMBULATORY_CARE_PROVIDER_SITE_OTHER): Payer: PRIVATE HEALTH INSURANCE | Admitting: Cardiovascular Disease

## 2012-02-01 ENCOUNTER — Encounter: Payer: Self-pay | Admitting: *Deleted

## 2012-02-01 ENCOUNTER — Encounter: Payer: Self-pay | Admitting: Cardiovascular Disease

## 2012-02-01 DIAGNOSIS — I219 Acute myocardial infarction, unspecified: Secondary | ICD-10-CM

## 2012-02-01 DIAGNOSIS — I2510 Atherosclerotic heart disease of native coronary artery without angina pectoris: Secondary | ICD-10-CM

## 2012-02-01 DIAGNOSIS — I4892 Unspecified atrial flutter: Secondary | ICD-10-CM

## 2012-02-01 DIAGNOSIS — I10 Essential (primary) hypertension: Secondary | ICD-10-CM

## 2012-02-01 LAB — CBC WITH DIFFERENTIAL/PLATELET
Basophils Absolute: 0 10*3/uL (ref 0.0–0.1)
HCT: 38.5 % — ABNORMAL LOW (ref 39.0–52.0)
Lymphs Abs: 2.3 10*3/uL (ref 0.7–4.0)
MCH: 29.8 pg (ref 26.0–34.0)
MCHC: 33.8 g/dL (ref 30.0–36.0)
MCV: 88.3 fL (ref 78.0–100.0)
Monocytes Absolute: 0.9 10*3/uL (ref 0.1–1.0)
Monocytes Relative: 14 % — ABNORMAL HIGH (ref 3–12)

## 2012-02-01 LAB — BASIC METABOLIC PANEL
CO2: 21 mEq/L (ref 19–32)
Glucose, Bld: 76 mg/dL (ref 70–99)
Potassium: 4 mEq/L (ref 3.5–5.3)
Sodium: 139 mEq/L (ref 135–145)

## 2012-02-01 NOTE — Progress Notes (Signed)
Estil Daft Date of Birth  1941/10/26 [facility] Office  [address]    [address]   [address] [place], [place]  [address]    [place], [place]  [address] [phone]  Fax  [phone]  [phone]  Fax [phone]  1. Coronary artery disease-status post CABG 2. Atrial flutter 3. Hypertension  History of Present Illness:  [place] has had atrial flutter for the past several months.  He is fairly asymptomatic  He is not exercising as much ( it's been 15 degrees in [place], [place]) He has not had any chest pain or dyspnea.   Current Outpatient Prescriptions on File Prior to Visit  Medication Sig Dispense Refill  . amLODipine (NORVASC) 5 MG tablet Take 5 mg by mouth daily.        [name] aspirin 325 MG tablet Take 325 mg by mouth daily.        [name] atorvastatin (LIPITOR) 40 MG tablet Take 40 mg by mouth daily.        . carvedilol (COREG) 25 MG tablet Take 1 tablet (25 mg total) by mouth 2 (two) times daily with a meal.  180 tablet  1  . Cholecalciferol (VITAMIN D) 1000 UNITS capsule Take 5 capsules (5,000 Units total) by mouth every other day.      . fenofibrate (TRICOR) 145 MG tablet Take 145 mg by mouth daily.        . fish oil-omega-3 fatty acids 1000 MG capsule Take by mouth daily.        . hydrochlorothiazide 25 MG tablet Take 25 mg by mouth daily.        [name] losartan (COZAAR) 100 MG tablet Take 1 tablet (100 mg total) by mouth daily.  90 tablet  1  . Multiple Vitamin (MULTIVITAMIN) tablet Take 1 tablet by mouth daily.        . potassium chloride SA (K-DUR,KLOR-CON) 20 MEQ tablet Take 1 tablet (20 mEq total) by mouth daily.  90 tablet  3  . Rivaroxaban (XARELTO) 20 MG TABS Take 20 mg by mouth daily.  90 tablet  3  . Zinc 50 MG CAPS Take by mouth daily.          Allergies  Allergen Reactions  . Niacin And Related     Stomach Pain    Past Medical History  Diagnosis Date  . Coronary artery disease   . Hypertension   . Dyslipidemia   . MI  (myocardial infarction) 1995  . Retinal detachment   . Pseudoaphakia     both eyes  . Atrial fibrillation     Past Surgical History  Procedure Date  . Coronary artery bypass graft 1996  . Cardiac catheterization 1996  . Tonsillectomy and adenoidectomy     History  Smoking status  . Never Smoker   Smokeless tobacco  . Not on file    History  Alcohol Use No    Family History  Problem Relation Age of Onset  . Coronary artery disease      Reviw of Systems:  Reviewed in the HPI.  All other systems are negative.  Physical Exam: Blood pressure 142/75, pulse 61, height 5\' 7"  (1.702 m), weight 161 lb 12.8 oz (73.392 kg). General: Well developed, well nourished, in no acute distress.  Head: Normocephalic, atraumatic, sclera non-icteric, mucus membranes are moist,   Neck: Supple. Negative for carotid bruits. JVD not elevated.  Lungs: Clear bilaterally to auscultation without wheezes,  rales, or rhonchi. Breathing is unlabored.  Heart: RRR with S1 S2. No murmurs, rubs, or gallops appreciated.  Abdomen: Soft, non-tender, non-distended with normoactive bowel sounds. No hepatomegaly. No rebound/guarding. No obvious abdominal masses.  Msk:  Strength and tone appear normal for age.  Extremities: No clubbing or cyanosis. No edema.  Distal pedal pulses are 2+ and equal bilaterally.  Neuro: Alert and oriented X 3. Moves all extremities spontaneously.  Psych:  Responds to questions appropriately with a normal affect.  ECG: Atrial flutter with a controlled ventricular response.  Assessment / Plan:

## 2012-02-01 NOTE — Patient Instructions (Signed)
Your physician has recommended that you have a Cardioversion (DCCV). Electrical Cardioversion uses a jolt of electricity to your heart either through paddles or wired patches attached to your chest. This is a controlled, usually prescheduled, procedure. Defibrillation is done under light anesthesia in the hospital, and you usually go home the day of the procedure. This is done to get your heart back into a normal rhythm. You are not awake for the procedure. Please see the instruction sheet given to you today.   02/08/12 CARDIOVERSION

## 2012-02-01 NOTE — Assessment & Plan Note (Signed)
Xavier Bell is doing quite well. We'll schedule him for a elective cardioversion next week. [name] been on Xarelto for least 2 months. His rate is well controlled. We've discussed the risks benefits and options of cardioversion. He understands and agrees to proceed.

## 2012-02-04 ENCOUNTER — Encounter (HOSPITAL_COMMUNITY): Payer: Self-pay | Admitting: Pharmacy Technician

## 2012-02-07 ENCOUNTER — Other Ambulatory Visit: Payer: Self-pay | Admitting: Cardiovascular Disease

## 2012-02-07 DIAGNOSIS — I4892 Unspecified atrial flutter: Secondary | ICD-10-CM

## 2012-02-08 ENCOUNTER — Other Ambulatory Visit: Payer: Self-pay

## 2012-02-08 ENCOUNTER — Ambulatory Visit (HOSPITAL_COMMUNITY): Payer: No Typology Code available for payment source | Admitting: Anesthesiology

## 2012-02-08 ENCOUNTER — Encounter (HOSPITAL_COMMUNITY)
Admission: RE | Disposition: A | Discharge status: Home / Self Care | Payer: Self-pay | Source: Ambulatory Visit | Attending: Cardiovascular Disease

## 2012-02-08 ENCOUNTER — Ambulatory Visit (HOSPITAL_COMMUNITY)
Admission: RE | Admit: 2012-02-08 | Discharge: 2012-02-08 | Disposition: A | Discharge status: Home / Self Care | Payer: No Typology Code available for payment source | Source: Ambulatory Visit | Attending: Cardiovascular Disease | Admitting: Cardiovascular Disease

## 2012-02-08 ENCOUNTER — Encounter (HOSPITAL_COMMUNITY): Payer: Self-pay | Admitting: Anesthesiology

## 2012-02-08 DIAGNOSIS — E785 Hyperlipidemia, unspecified: Secondary | ICD-10-CM | POA: Insufficient documentation

## 2012-02-08 DIAGNOSIS — I4892 Unspecified atrial flutter: Secondary | ICD-10-CM

## 2012-02-08 DIAGNOSIS — J322 Chronic ethmoidal sinusitis: Secondary | ICD-10-CM | POA: Insufficient documentation

## 2012-02-08 DIAGNOSIS — I2510 Atherosclerotic heart disease of native coronary artery without angina pectoris: Secondary | ICD-10-CM | POA: Insufficient documentation

## 2012-02-08 DIAGNOSIS — I252 Old myocardial infarction: Secondary | ICD-10-CM | POA: Insufficient documentation

## 2012-02-08 DIAGNOSIS — I10 Essential (primary) hypertension: Secondary | ICD-10-CM | POA: Insufficient documentation

## 2012-02-08 HISTORY — PX: CARDIOVERSION: SHX1299

## 2012-02-08 SURGERY — CARDIOVERSION
Anesthesia: General | Wound class: Clean

## 2012-02-08 MED ORDER — PROPOFOL 10 MG/ML IV EMUL
INTRAVENOUS | Status: DC | PRN
  Administered 2012-02-08: 90 mg via INTRAVENOUS

## 2012-02-08 MED ORDER — SODIUM CHLORIDE 0.9 % IJ SOLN: 3.0000 mL | Freq: Two times a day (BID) | INTRAMUSCULAR | Status: DC

## 2012-02-08 MED ORDER — SODIUM CHLORIDE 0.9 % IV SOLN
250.0000 mL | INTRAVENOUS | Status: DC
  Administered 2012-02-08: 250 mL via INTRAVENOUS

## 2012-02-08 MED ORDER — SODIUM CHLORIDE 0.9 % IJ SOLN: 3.0000 mL | INTRAMUSCULAR | Status: DC | PRN

## 2012-02-08 MED ORDER — HYDROCORTISONE 1 % EX CREA
1.0000 "application " | TOPICAL_CREAM | Freq: Three times a day (TID) | CUTANEOUS | Status: DC | PRN
  Filled 2012-02-08: qty 28

## 2012-02-08 MED ORDER — SODIUM CHLORIDE 0.9 % IV SOLN
INTRAVENOUS | Status: DC | PRN
  Administered 2012-02-08: 14:00:00 via INTRAVENOUS

## 2012-02-08 NOTE — Interval H&P Note (Signed)
History and Physical Interval Note:  02/08/2012 2:04 PM  Xavier Bell  has presented today for surgery, with the diagnosis of AFIB  The various methods of treatment have been discussed with the patient and family. After consideration of risks, benefits and other options for treatment, the patient has consented to  Procedure(s) (LRB): CARDIOVERSION (N/A) as a surgical intervention .  The patients' history has been reviewed, patient examined, no change in status, stable for surgery.  I have reviewed the patients' chart and labs.  Questions were answered to the patient's satisfaction.    Pt is ready for cardioversion   Elyn Aquas.

## 2012-02-08 NOTE — Preoperative (Signed)
Beta Blockers   Reason not to administer Beta Blockers:Not Applicable 

## 2012-02-08 NOTE — Anesthesia Postprocedure Evaluation (Signed)
  Anesthesia Post-op Note  Patient: Vollie P Tacker  Procedure(s) Performed: Procedure(s) (LRB): CARDIOVERSION (N/A)  Patient Location: PACU and Short Stay  Anesthesia Type: MAC  Level of Consciousness: awake, alert  and oriented  Airway and Oxygen Therapy: Patient Spontanous Breathing and Patient connected to nasal cannula oxygen  Post-op Pain: none  Post-op Assessment: Post-op Vital signs reviewed, Patient's Cardiovascular Status Stable, Respiratory Function Stable, Patent Airway and No signs of Nausea or vomiting  Post-op Vital Signs: Reviewed and stable  Complications: No apparent anesthesia complications

## 2012-02-08 NOTE — Op Note (Signed)
    Cardioversion Note  BHARATH BERNSTEIN [id number] Nov 03, 1941  Procedure: DC Cardioversion Indications:  Atrial Flutter  Procedure Details Consent: Obtained Time Out: Verified patient identification, verified procedure, site/side was marked, verified correct patient position, special equipment/implants available, Radiology Safety Procedures followed,  medications/allergies/relevent history reviewed, required imaging and test results available.  Performed  The patient has been on adequate anticoagulation.  The patient received IV Propafol  for sedation.  Synchronous cardioversion was performed at 20 joules.  The cardioversion was successful    Complications: No apparent complications Patient did tolerate procedure well.   Vesta Mixer, Montez Hageman., MD, [facility] 02/08/2012, 2:13 PM

## 2012-02-08 NOTE — Discharge Instructions (Signed)
Electrical Cardioversion Cardioversion is the delivery of a jolt of electricity to change the rhythm of the heart. Sticky patches or metal paddles are placed on the chest to deliver the electricity from a special device. This is done to restore a normal rhythm. A rhythm that is too fast or not regular keeps the heart from pumping well. Compared to medicines used to change an abnormal rhythm, cardioversion is faster and works better. It is also unpleasant and may dislodge blood clots from the heart. WHEN WOULD THIS BE DONE?  In an emergency:   There is low or no blood pressure as a result of the heart rhythm.   Normal rhythm must be restored as fast as possible to protect the brain and heart from further damage.   It may save a life.   For less serious heart rhythms, such as atrial fibrillation or flutter, in which:   The heart is beating too fast or is not regular.   The heart is still able to pump enough blood, but not as well as it should.   Medicine to change the rhythm has not worked.   It is safe to wait in order to allow time for preparation.  LET YOUR CAREGIVER KNOW ABOUT:   Every medicine you are taking. It is very important to do this! Know when to take or stop taking any of them.   Any time in the past that you have felt your heart was not beating normally.  RISKS AND COMPLICATIONS   Clots may form in the chambers of the heart if it is beating too fast. These clots may be dislodged during the procedure and travel to other parts of the body.   There is risk of a stroke during and after the procedure if a clot moves. Blood thinners lower this risk.   You may have a special test of your heart (TEE) to make sure there are no clots in your heart.  BEFORE THE PROCEDURE   You may have some tests to see how well your heart is working.   You may start taking blood thinners so your blood does not clot as easily.   Other drugs may be given to help your heart work better.    PROCEDURE (SCHEDULED)  The procedure is typically done in a hospital by a heart doctor (cardiologist).   You will be told when and where to go.   You may be given some medicine through an intravenous (IV) access to reduce discomfort and make you sleepy before the procedure.   Your whole body may move when the shock is delivered. Your chest may feel sore.   You may be able to go home after a few hours. Your heart rhythm will be watched to make sure it does not change.  HOME CARE INSTRUCTIONS   Only take medicine as directed by your caregiver. Be sure you understand how and when to take your medicine.   Learn how to feel your pulse and check it often.   Limit your activity for 48 hours.   Avoid caffeine and other stimulants as directed.  SEEK MEDICAL CARE IF:   You feel like your heart is beating too fast or your pulse is not regular.   You have any questions about your medicines.   You have bleeding that will not stop.  SEEK IMMEDIATE MEDICAL CARE IF:   You are dizzy or feel faint.   It is hard to breathe or you feel short of breath.     There is a change in discomfort in your chest.   Your speech is slurred or you have trouble moving your arm or leg on one side.   You get a muscle cramp.   Your fingers or toes turn cold or blue.  MAKE SURE YOU:   Understand these instructions.   Will watch your condition.   Will get help right away if you are not doing well or get worse.  Document Released: 11/30/2002 Document Revised: 08/22/2011 Document Reviewed: 03/31/2008 Exit[employer] Patient Information 2012 [employer]. 

## 2012-02-08 NOTE — Transfer of Care (Signed)
Immediate Anesthesia Transfer of Care Note  Patient: Xavier Bell  Procedure(s) Performed: Procedure(s) (LRB): CARDIOVERSION (N/A)  Patient Location: PACU  Anesthesia Type: MAC  Level of Consciousness: awake, alert  and oriented  Airway & Oxygen Therapy: Patient Spontanous Breathing and Patient connected to nasal cannula oxygen  Post-op Assessment: Report given to PACU RN  Post vital signs: Reviewed and stable  Complications: No apparent anesthesia complications

## 2012-02-08 NOTE — H&P (View-Only) (Signed)
  Xavier Bell Date of Birth  07/28/1941 [name]     [name]  [address]    [address]   [address] [name], [name]    [name], [name] [phone]  Fax  [phone]  [phone]  Fax [phone]  1. Coronary artery disease-status post CABG 2. Atrial flutter 3. Hypertension  History of Present Illness:  Xavier Bell has had atrial flutter for the past several months.  He is fairly asymptomatic  He is not exercising as much ( it's been 15 degrees in [place], [place]) He has not had any chest pain or dyspnea.   Current Outpatient Prescriptions on File Prior to Visit  Medication Sig Dispense Refill  . amLODipine (NORVASC) 5 MG tablet Take 5 mg by mouth daily.        . aspirin 325 MG tablet Take 325 mg by mouth daily.        . atorvastatin (LIPITOR) 40 MG tablet Take 40 mg by mouth daily.        . carvedilol (COREG) 25 MG tablet Take 1 tablet (25 mg total) by mouth 2 (two) times daily with a meal.  180 tablet  1  . Cholecalciferol (VITAMIN D) 1000 UNITS capsule Take 5 capsules (5,000 Units total) by mouth every other day.      . fenofibrate (TRICOR) 145 MG tablet Take 145 mg by mouth daily.        . fish oil-omega-3 fatty acids 1000 MG capsule Take by mouth daily.        . hydrochlorothiazide 25 MG tablet Take 25 mg by mouth daily.        . losartan (COZAAR) 100 MG tablet Take 1 tablet (100 mg total) by mouth daily.  90 tablet  1  . Multiple Vitamin (MULTIVITAMIN) tablet Take 1 tablet by mouth daily.        . potassium chloride SA (K-DUR,KLOR-CON) 20 MEQ tablet Take 1 tablet (20 mEq total) by mouth daily.  90 tablet  3  . Rivaroxaban (XARELTO) 20 MG TABS Take 20 mg by mouth daily.  90 tablet  3  . Zinc 50 MG CAPS Take by mouth daily.          Allergies  Allergen Reactions  . Niacin And Related     Stomach Pain    Past Medical History  Diagnosis Date  . Coronary artery disease   . Hypertension   . Dyslipidemia   . MI  (myocardial infarction) 1995  . Retinal detachment   . Pseudoaphakia     both eyes  . Atrial fibrillation     Past Surgical History  Procedure Date  . Coronary artery bypass graft 1996  . Cardiac catheterization 1996  . Tonsillectomy and adenoidectomy     History  Smoking status  . Never Smoker   Smokeless tobacco  . Not on file    History  Alcohol Use No    Family History  Problem Relation Age of Onset  . Coronary artery disease      Reviw of Systems:  Reviewed in the HPI.  All other systems are negative.  Physical Exam: Blood pressure 142/75, pulse 61, height 5' 7" (1.702 m), weight 161 lb 12.8 oz (73.392 kg). General: Well developed, well nourished, in no acute distress.  Head: Normocephalic, atraumatic, sclera non-icteric, mucus membranes are moist,   Neck: Supple. Negative for carotid bruits. JVD not elevated.  Lungs: Clear bilaterally to auscultation without wheezes,   rales, or rhonchi. Breathing is unlabored.  Heart: RRR with S1 S2. No murmurs, rubs, or gallops appreciated.  Abdomen: Soft, non-tender, non-distended with normoactive bowel sounds. No hepatomegaly. No rebound/guarding. No obvious abdominal masses.  Msk:  Strength and tone appear normal for age.  Extremities: No clubbing or cyanosis. No edema.  Distal pedal pulses are 2+ and equal bilaterally.  Neuro: Alert and oriented X 3. Moves all extremities spontaneously.  Psych:  Responds to questions appropriately with a normal affect.  ECG: Atrial flutter with a controlled ventricular response.  Assessment / Plan:   

## 2012-02-08 NOTE — Anesthesia Preprocedure Evaluation (Signed)
Anesthesia Evaluation  Patient identified by MRN, date of birth, ID band Patient awake    Reviewed: Allergy & Precautions, H&P , NPO status , Patient's Chart, lab work & pertinent test results, reviewed documented beta blocker date and time   Airway       Dental   Pulmonary          Cardiovascular hypertension, Pt. on medications + CAD and + Past MI + dysrhythmias Atrial Fibrillation     Neuro/Psych    GI/Hepatic   Endo/Other    Renal/GU      Musculoskeletal   Abdominal   Peds  Hematology   Anesthesia Other Findings   Reproductive/Obstetrics                           Anesthesia Physical Anesthesia Plan  ASA: III  Anesthesia Plan: MAC   Post-op Pain Management:    Induction: Intravenous  Airway Management Planned: Mask  Additional Equipment:   Intra-op Plan:   Post-operative Plan:   Informed Consent: I have reviewed the patients History and Physical, chart, labs and discussed the procedure including the risks, benefits and alternatives for the proposed anesthesia with the patient or authorized representative who has indicated his/her understanding and acceptance.   Dental advisory given  Plan Discussed with: CRNA, Anesthesiologist and Surgeon  Anesthesia Plan Comments:         Anesthesia Quick Evaluation

## 2012-02-11 ENCOUNTER — Encounter (HOSPITAL_COMMUNITY): Payer: Self-pay | Admitting: Cardiovascular Disease

## 2012-02-12 ENCOUNTER — Telehealth: Payer: Self-pay | Admitting: Cardiovascular Disease

## 2012-02-12 NOTE — Telephone Encounter (Signed)
Patient aware of post cardioversion appointment with Norma Fredrickson NP on 02/29/12 at 3:00 Pm.

## 2012-02-12 NOTE — Telephone Encounter (Signed)
New Msg: Pt calling to schedule fu appt following hospital DC. Pt wanted to know if Dr. Elease Hashimoto would approve for pt to see [place] as oppose to MD. Pt needs an appoint late as possible on a Friday considering he works throughout the week in [place]. Please return pt call to discuss further.

## 2012-02-22 ENCOUNTER — Other Ambulatory Visit: Payer: Self-pay | Admitting: Cardiovascular Disease

## 2012-02-22 ENCOUNTER — Encounter: Payer: PRIVATE HEALTH INSURANCE | Admitting: Nurse Practitioner

## 2012-02-22 MED ORDER — ATORVASTATIN CALCIUM 40 MG PO TABS: 40.0000 mg | ORAL_TABLET | Freq: Every day | ORAL | Status: DC

## 2012-02-22 MED ORDER — AMLODIPINE BESYLATE 5 MG PO TABS: 5.0000 mg | ORAL_TABLET | Freq: Every day | ORAL | Status: DC

## 2012-02-22 MED ORDER — FENOFIBRATE 145 MG PO TABS: 145.0000 mg | ORAL_TABLET | Freq: Every day | ORAL | Status: DC

## 2012-02-22 NOTE — Telephone Encounter (Signed)
Pt wants 90 day supply of these meds

## 2012-02-29 ENCOUNTER — Ambulatory Visit (INDEPENDENT_AMBULATORY_CARE_PROVIDER_SITE_OTHER): Payer: PRIVATE HEALTH INSURANCE | Admitting: Nurse Practitioner

## 2012-02-29 ENCOUNTER — Encounter: Payer: Self-pay | Admitting: Nurse Practitioner

## 2012-02-29 VITALS — BP 148/68 | HR 57 | Ht 67.0 in | Wt 160.0 lb

## 2012-02-29 DIAGNOSIS — I4892 Unspecified atrial flutter: Secondary | ICD-10-CM

## 2012-02-29 DIAGNOSIS — I2510 Atherosclerotic heart disease of native coronary artery without angina pectoris: Secondary | ICD-10-CM

## 2012-02-29 NOTE — Patient Instructions (Signed)
I think you are doing well.  We will see you back in 6 weeks.   Call the [facility] office at [phone] if you have any questions, problems or concerns.

## 2012-02-29 NOTE — Assessment & Plan Note (Signed)
He remains in sinus rhythm. Feels good clinically. I suspect he will require long term anticoagulation but will defer to Dr. Elease Hashimoto. He will see him back in about 6 weeks. No change in his medicines today. If Xarelto is long term, then need to stop his aspirin. Patient is agreeable to this plan and will call if any problems develop in the interim.

## 2012-02-29 NOTE — Progress Notes (Signed)
Estil Daft Date of Birth: [birth year] Medical Record #[id number]  History of Present Illness: Mr. Anastasi is seen back today for a post hospital visit. He is seen for Dr. Elease Hashimoto. He has had recent successful cardioversion for atrial flutter. He remains on Xarelto. CHADSvasc is 3. CHADs is a 1. He was not really symptomatic and had basically no awareness of his atrial fib.   He comes in today. He is doing well. He says that he thinks he is feeling better. Notices less shortness of breath. No chest pain. Continues to commute from [place] to [place] weekly for work.   Current Outpatient Prescriptions on File Prior to Visit  Medication Sig Dispense Refill  . amLODipine (NORVASC) 5 MG tablet Take 1 tablet (5 mg total) by mouth daily.  90 tablet  3  . aspirin 325 MG tablet Take 325 mg by mouth daily.        [name] atorvastatin (LIPITOR) 40 MG tablet Take 1 tablet (40 mg total) by mouth daily.  90 tablet  3  . carvedilol (COREG) 25 MG tablet Take 1 tablet (25 mg total) by mouth 2 (two) times daily with a meal.  180 tablet  1  . Cholecalciferol (VITAMIN D) 1000 UNITS capsule Take 5 capsules (5,000 Units total) by mouth every other day.      . dorzolamide-timolol (COSOPT) 22.3-6.8 MG/ML ophthalmic solution Place 2 drops into both eyes 2 (two) times daily.      . fenofibrate (TRICOR) 145 MG tablet Take 1 tablet (145 mg total) by mouth daily.  90 tablet  3  . fish oil-omega-3 fatty acids 1000 MG capsule Take 1 g by mouth daily.       . hydrochlorothiazide 25 MG tablet Take 25 mg by mouth daily.        [name] losartan (COZAAR) 100 MG tablet Take 1 tablet (100 mg total) by mouth daily.  90 tablet  1  . Multiple Vitamin (MULTIVITAMIN) tablet Take 1 tablet by mouth daily.        . potassium chloride SA (K-DUR,KLOR-CON) 20 MEQ tablet Take 1 tablet (20 mEq total) by mouth daily.  90 tablet  3  . Rivaroxaban (XARELTO) 20 MG TABS Take 20 mg by mouth daily.  90 tablet  3  . Zinc 50 MG CAPS Take 50 mg by mouth daily.          Allergies  Allergen Reactions  . Niacin And Related     Stomach Pain    Past Medical History  Diagnosis Date  . Coronary artery disease     s/p remote MI in 1994 with multiple PCI and subsequent CABG x 5 in 1996  . Hypertension   . Dyslipidemia   . MI (myocardial infarction) 1995  . Retinal detachment   . Pseudoaphakia     both eyes  . Atrial flutter     s/p cardioversion 02/08/12  . Chronic anticoagulation   . Normal nuclear stress test 2006    Past Surgical History  Procedure Date  . Coronary artery bypass graft 1996    LIMA to LAD, SVG to RV marginal & PD, SVG  to DX and SVG to Ramus  . Cardiac catheterization 1996  . Tonsillectomy and adenoidectomy   . Cardioversion 02/08/2012    Procedure: CARDIOVERSION;  Surgeon: Elyn Aquas., MD;  Location: [facility] OR;  Service: Cardiovascular;  Laterality: N/A;    History  Smoking status  . Never Smoker   Smokeless tobacco  . Not  on file    History  Alcohol Use No    Family History  Problem Relation Age of Onset  . Coronary artery disease    . Heart disease Mother   . Heart disease Father     Review of Systems: The review of systems is per the HPI.  All other systems were reviewed and are negative.  Physical Exam: BP 148/68  Pulse 57  Ht 5\' 7"  (1.702 m)  Wt 160 lb (72.576 kg)  BMI 25.06 kg/m2 Patient is very pleasant and in no acute distress. Skin is warm and dry. Color is normal.  HEENT is unremarkable. Normocephalic/atraumatic. PERRL. Sclera are nonicteric. Neck is supple. No masses. No JVD. Lungs are clear. Cardiac exam shows a regular rate and rhythm. Abdomen is soft. Extremities are without edema. Gait and ROM are intact. No gross neurologic deficits noted.   LABORATORY DATA: EKG today shows sinus bradycardia with a first degree AV block. PR is .23.    Assessment / Plan:

## 2012-02-29 NOTE — Assessment & Plan Note (Signed)
No chest pain reported. Will continue with his current treatment plan.

## 2012-03-10 ENCOUNTER — Telehealth: Payer: Self-pay | Admitting: Cardiovascular Disease

## 2012-03-10 MED ORDER — AMLODIPINE BESYLATE 10 MG PO TABS: 10.0000 mg | ORAL_TABLET | Freq: Every day | ORAL | Status: DC

## 2012-03-10 NOTE — Telephone Encounter (Signed)
Pt is to be on amlodipine 10 mg/ paper chart reviewed.

## 2012-03-10 NOTE — Telephone Encounter (Signed)
New Msg: Pt calling wanting to speak with nurse/MD to clarify how many mg of amlodipine pt should be taking 5mg  or 10 mg. Please return pt call to discuss further.

## 2012-03-31 ENCOUNTER — Telehealth: Payer: Self-pay | Admitting: Cardiovascular Disease

## 2012-03-31 MED ORDER — HYDROCHLOROTHIAZIDE 25 MG PO TABS: 25.0000 mg | ORAL_TABLET | Freq: Every day | ORAL | Status: DC

## 2012-03-31 NOTE — Telephone Encounter (Signed)
New Problem:    Patient called in needing a refill (and new prescription written) of his hydrochlorothiazide 25 MG tablet.  Please call back, or Jodette, if you have any questions.

## 2012-03-31 NOTE — Telephone Encounter (Signed)
Fax Received. Refill Completed. Joram Venson Chowoe (R.M.A)   

## 2012-04-11 ENCOUNTER — Encounter: Payer: Self-pay | Admitting: Cardiovascular Disease

## 2012-04-11 ENCOUNTER — Ambulatory Visit (INDEPENDENT_AMBULATORY_CARE_PROVIDER_SITE_OTHER): Payer: PRIVATE HEALTH INSURANCE | Admitting: Cardiovascular Disease

## 2012-04-11 VITALS — BP 139/69 | HR 70 | Resp 18 | Ht 67.0 in | Wt 162.4 lb

## 2012-04-11 DIAGNOSIS — I4892 Unspecified atrial flutter: Secondary | ICD-10-CM

## 2012-04-11 DIAGNOSIS — I10 Essential (primary) hypertension: Secondary | ICD-10-CM

## 2012-04-11 NOTE — Patient Instructions (Signed)
Your physician recommends that you schedule a follow-up appointment in: 3 months with Dr. [name]   Your physician recommends that you continue on your current medications as directed. Please refer to the Current Medication list given to you today.  

## 2012-04-11 NOTE — Progress Notes (Signed)
Estil Daft Date of Birth  Feb 24, 1941 [facility] Office  [address]    [address]   [address] [place], [place]  [address]    [place], [place]  [address] [phone]  Fax  [phone]  [phone]  Fax [phone]  1. Coronary artery disease-status post CABG 2. Atrial flutter 3. Hypertension 4. Right carotid bruit  History of Present Illness:  [place]ica has had atrial flutter for the past several months.  He is fairly asymptomatic  He is not exercising as much ( it's been 15 degrees in [place]) He has not had any chest pain or dyspnea.   Current Outpatient Prescriptions on File Prior to Visit  Medication Sig Dispense Refill  . amLODipine (NORVASC) 10 MG tablet Take 1 tablet (10 mg total) by mouth daily.  90 tablet  3  . aspirin 325 MG tablet Take 325 mg by mouth daily.        [name] atorvastatin (LIPITOR) 40 MG tablet Take 1 tablet (40 mg total) by mouth daily.  90 tablet  3  . carvedilol (COREG) 25 MG tablet Take 1 tablet (25 mg total) by mouth 2 (two) times daily with a meal.  180 tablet  1  . Cholecalciferol (VITAMIN D) 1000 UNITS capsule Take 5 capsules (5,000 Units total) by mouth every other day.      . dorzolamide-timolol (COSOPT) 22.3-6.8 MG/ML ophthalmic solution Place 2 drops into both eyes 2 (two) times daily.      . fenofibrate (TRICOR) 145 MG tablet Take 1 tablet (145 mg total) by mouth daily.  90 tablet  3  . fish oil-omega-3 fatty acids 1000 MG capsule Take 1 g by mouth daily.       . hydrochlorothiazide (HYDRODIURIL) 25 MG tablet Take 1 tablet (25 mg total) by mouth daily.  90 tablet  3  . losartan (COZAAR) 100 MG tablet Take 1 tablet (100 mg total) by mouth daily.  90 tablet  1  . Multiple Vitamin (MULTIVITAMIN) tablet Take 1 tablet by mouth daily.        . Rivaroxaban (XARELTO) 20 MG TABS Take 20 mg by mouth daily.  90 tablet  3  . Zinc 50 MG CAPS Take 50 mg by mouth daily.       . potassium chloride SA (K-DUR,KLOR-CON)  20 MEQ tablet Take 1 tablet (20 mEq total) by mouth daily.  90 tablet  3    Allergies  Allergen Reactions  . Niacin And Related     Stomach Pain    Past Medical History  Diagnosis Date  . Coronary artery disease     s/p remote MI in 1994 with multiple PCI and subsequent CABG x 5 in 1996  . Hypertension   . Dyslipidemia   . MI (myocardial infarction) 1995  . Retinal detachment   . Pseudoaphakia     both eyes  . Atrial flutter     s/p cardioversion 02/08/12  . Chronic anticoagulation   . Normal nuclear stress test 2006    Past Surgical History  Procedure Date  . Coronary artery bypass graft 1996    LIMA to LAD, SVG to RV marginal & PD, SVG  to DX and SVG to Ramus  . Cardiac catheterization 1996  . Tonsillectomy and adenoidectomy   . Cardioversion 02/08/2012    Procedure: CARDIOVERSION;  Surgeon: Elyn Aquas., MD;  Location: [facility] OR;  Service: Cardiovascular;  Laterality: N/A;  History  Smoking status  . Never Smoker   Smokeless tobacco  . Not on file    History  Alcohol Use No    Family History  Problem Relation Age of Onset  . Coronary artery disease    . Heart disease Mother   . Heart disease Father     Reviw of Systems:  Reviewed in the HPI.  All other systems are negative.  Physical Exam: Blood pressure 139/69, pulse 70, resp. rate 18, height 5\' 7"  (1.702 m), weight 162 lb 6.4 oz (73.664 kg). General: Well developed, well nourished, in no acute distress.  Head: Normocephalic, atraumatic, sclera non-icteric, mucus membranes are moist,   Neck: Supple. Negative for carotid bruits. JVD not elevated.  Lungs: Clear bilaterally to auscultation without wheezes, rales, or rhonchi. Breathing is unlabored.  Heart: RRR with S1 S2. No murmurs, rubs, or gallops appreciated.  Abdomen: Soft, non-tender, non-distended with normoactive bowel sounds. No hepatomegaly. No rebound/guarding. No obvious abdominal masses.  Msk:  Strength and tone appear normal for  age.  Extremities: No clubbing or cyanosis. No edema.  Distal pedal pulses are 2+ and equal bilaterally.  Neuro: Alert and oriented X 3. Moves all extremities spontaneously.  Psych:  Responds to questions appropriately with a normal affect.  ECG: Atrial flutter with a controlled ventricular response.  Assessment / Plan:

## 2012-04-11 NOTE — Assessment & Plan Note (Signed)
[place]ica seems to be doing fairly well. His blood pressure is well-controlled. He has maintained sinus rhythm by my exam. We'll continue with the Xarelto 20 daily for now. We'll reassess him in 3 months for an office visit and EKG.

## 2012-04-11 NOTE — Assessment & Plan Note (Signed)
Xavier Bell still eats out quite a bit of salt. His blood pressure well controlled but I encouraged him to decrease his salt intake and I think it will be up to decrease his blood pressure medications. I would decrease his amlodipine by 1/2 inches blood pressure drops because lower salt intake.

## 2012-04-21 ENCOUNTER — Other Ambulatory Visit: Payer: Self-pay | Admitting: *Deleted

## 2012-04-21 ENCOUNTER — Telehealth: Payer: Self-pay | Admitting: Cardiovascular Disease

## 2012-04-21 MED ORDER — POTASSIUM CHLORIDE CRYS ER 20 MEQ PO TBCR: 20.0000 meq | EXTENDED_RELEASE_TABLET | Freq: Every day | ORAL | Status: DC

## 2012-04-21 NOTE — Telephone Encounter (Signed)
Pt needs refill [facility] 90 day supply

## 2012-05-07 ENCOUNTER — Other Ambulatory Visit: Payer: Self-pay | Admitting: *Deleted

## 2012-05-07 MED ORDER — CARVEDILOL 25 MG PO TABS: 25.0000 mg | ORAL_TABLET | Freq: Two times a day (BID) | ORAL | Status: DC

## 2012-05-07 MED ORDER — LOSARTAN POTASSIUM 100 MG PO TABS: 100.0000 mg | ORAL_TABLET | Freq: Every day | ORAL | Status: DC

## 2012-07-04 ENCOUNTER — Telehealth: Payer: Self-pay | Admitting: Cardiovascular Disease

## 2012-07-04 NOTE — Telephone Encounter (Signed)
Pt needs to know if he should still be taking fish oil tablets

## 2012-07-08 NOTE — Telephone Encounter (Signed)
Please advise if pt is to be on fish oil.

## 2012-07-09 NOTE — Telephone Encounter (Signed)
Pt may take fish oil for heart but reports are stating fish oil may cause prostate problems, Dr Elease Hashimoto left option up to pt to decide and report at next office visit if he wants to remain on fish oil.

## 2012-07-09 NOTE — Telephone Encounter (Signed)
F/u   Patient would like  Return call to discuss his daily fish oil regimen.  Patient would like to discuss with nurse to figure out if he should be taking this vitamin. Patient can be reached at [phone]

## 2012-08-08 ENCOUNTER — Ambulatory Visit (INDEPENDENT_AMBULATORY_CARE_PROVIDER_SITE_OTHER): Payer: PRIVATE HEALTH INSURANCE | Admitting: Cardiovascular Disease

## 2012-08-08 ENCOUNTER — Encounter: Payer: Self-pay | Admitting: Cardiovascular Disease

## 2012-08-08 VITALS — BP 149/70 | HR 62 | Ht 67.0 in | Wt 164.0 lb

## 2012-08-08 DIAGNOSIS — E785 Hyperlipidemia, unspecified: Secondary | ICD-10-CM

## 2012-08-08 DIAGNOSIS — I2510 Atherosclerotic heart disease of native coronary artery without angina pectoris: Secondary | ICD-10-CM

## 2012-08-08 NOTE — Assessment & Plan Note (Signed)
[name] not been as careful with his diet as he needs to be. We'll check fasting labs on Monday before he goes back to [place]. We'll continue with the current dose of atorvastatin. We'll see him back in 6 months for an office visit and fasting lipids, basic metabolic profile, and HFP.

## 2012-08-08 NOTE — Assessment & Plan Note (Signed)
[name] not had any episodes of chest pain. Encouraged him to exercise on a regular basis. Continue same medications.

## 2012-08-08 NOTE — Progress Notes (Signed)
Xavier Bell Date of Birth  [birth year] [facility] Office  [address]    [address]   [address] [place], [place]  [address]    [place], [place]  [address] [phone]  Fax  [phone]  [phone]  Fax [phone]  1. Coronary artery disease-status post CABG 2. Atrial flutter 3. Hypertension 4. Right carotid bruit  History of Present Illness:  Has a history of atrial flutter this past spring. He status post cardioversion. [name] done very well. He continues to take Xarelto.  [name] not been exercising nearly as much as he needs to. [name] also been careless with his diet and has picked up a few pounds.   Current Outpatient Prescriptions on File Prior to Visit  Medication Sig Dispense Refill  . amLODipine (NORVASC) 10 MG tablet Take 1 tablet (10 mg total) by mouth daily.  90 tablet  3  . aspirin 325 MG tablet Take 325 mg by mouth daily.        [name] atorvastatin (LIPITOR) 40 MG tablet Take 1 tablet (40 mg total) by mouth daily.  90 tablet  3  . carvedilol (COREG) 25 MG tablet Take 1 tablet (25 mg total) by mouth 2 (two) times daily with a meal.  180 tablet  3  . Cholecalciferol (VITAMIN D) 1000 UNITS capsule Take 5 capsules (5,000 Units total) by mouth every other day.      . dorzolamide-timolol (COSOPT) 22.3-6.8 MG/ML ophthalmic solution Place 2 drops into both eyes 2 (two) times daily.      . fenofibrate (TRICOR) 145 MG tablet Take 1 tablet (145 mg total) by mouth daily.  90 tablet  3  . fish oil-omega-3 fatty acids 1000 MG capsule Take by mouth daily. Taking 1200mg       . hydrochlorothiazide (HYDRODIURIL) 25 MG tablet Take 1 tablet (25 mg total) by mouth daily.  90 tablet  3  . losartan (COZAAR) 100 MG tablet Take 1 tablet (100 mg total) by mouth daily.  90 tablet  3  . Multiple Vitamin (MULTIVITAMIN) tablet Take 1 tablet by mouth daily.        . potassium chloride SA (K-DUR,KLOR-CON) 20 MEQ tablet Take 1 tablet (20 mEq total) by mouth daily.  90  tablet  3  . Rivaroxaban (XARELTO) 20 MG TABS Take 20 mg by mouth daily.  90 tablet  3  . Zinc 50 MG CAPS Take 50 mg by mouth daily.         Allergies  Allergen Reactions  . Niacin And Related     Stomach Pain    Past Medical History  Diagnosis Date  . Coronary artery disease     s/p remote MI in 1994 with multiple PCI and subsequent CABG x 5 in 1996  . Hypertension   . Dyslipidemia   . MI (myocardial infarction) 1995  . Retinal detachment   . Pseudoaphakia     both eyes  . Atrial flutter     s/p cardioversion 02/08/12  . Chronic anticoagulation   . Normal nuclear stress test 2006    Past Surgical History  Procedure Date  . Coronary artery bypass graft 1996    LIMA to LAD, SVG to RV marginal & PD, SVG  to DX and SVG to Ramus  . Cardiac catheterization 1996  . Tonsillectomy and adenoidectomy   . Cardioversion 02/08/2012    Procedure: CARDIOVERSION;  Surgeon: Elyn Aquas., MD;  Location: [facility] OR;  Service: Cardiovascular;  Laterality: N/A;    History  Smoking status  . Never Smoker   Smokeless tobacco  . Not on file    History  Alcohol Use No    Family History  Problem Relation Age of Onset  . Coronary artery disease    . Heart disease Mother   . Heart disease Father     Reviw of Systems:  Reviewed in the HPI.  All other systems are negative.  Physical Exam: Blood pressure 149/70, pulse 62, height 5\' 7"  (1.702 m), weight 164 lb (74.39 kg). General: Well developed, well nourished, in no acute distress.  Head: Normocephalic, atraumatic, sclera non-icteric, mucus membranes are moist,   Neck: Supple. Negative for carotid bruits. JVD not elevated.  Lungs: Clear bilaterally to auscultation without wheezes, rales, or rhonchi. Breathing is unlabored.  Heart: RRR with S1 S2. No murmurs, rubs, or gallops appreciated.  Abdomen: Soft, non-tender, non-distended with normoactive bowel sounds. No hepatomegaly. No rebound/guarding. No obvious abdominal  masses.  Msk:  Strength and tone appear normal for age.  Extremities: No clubbing or cyanosis. No edema.  Distal pedal pulses are 2+ and equal bilaterally.  Neuro: Alert and oriented X 3. Moves all extremities spontaneously.  Psych:  Responds to questions appropriately with a normal affect.  ECG:  Assessment / Plan:

## 2012-08-08 NOTE — Patient Instructions (Addendum)
Your physician recommends that you return for a FASTING lipid profile: NEXT WEEK  Your physician wants you to follow-up in: 6 MONTHS  You will receive a reminder letter in the mail two months in advance. If you don't receive a letter, please call our office to schedule the follow-up appointment.   

## 2012-08-11 ENCOUNTER — Other Ambulatory Visit (INDEPENDENT_AMBULATORY_CARE_PROVIDER_SITE_OTHER): Payer: PRIVATE HEALTH INSURANCE

## 2012-08-11 DIAGNOSIS — E785 Hyperlipidemia, unspecified: Secondary | ICD-10-CM

## 2012-08-11 LAB — LIPID PANEL
Total CHOL/HDL Ratio: 3
Triglycerides: 98 mg/dL (ref 0.0–149.0)

## 2012-08-11 LAB — HEPATIC FUNCTION PANEL
ALT: 23 U/L (ref 0–53)
AST: 30 U/L (ref 0–37)
Albumin: 4.2 g/dL (ref 3.5–5.2)
Alkaline Phosphatase: 56 U/L (ref 39–117)

## 2012-08-11 LAB — BASIC METABOLIC PANEL
CO2: 27 mEq/L (ref 19–32)
Calcium: 9.4 mg/dL (ref 8.4–10.5)
Creatinine, Ser: 1.2 mg/dL (ref 0.4–1.5)
GFR: 60.66 mL/min (ref >60.00)
Sodium: 141 mEq/L (ref 135–145)

## 2012-08-12 ENCOUNTER — Telehealth: Payer: Self-pay | Admitting: Cardiovascular Disease

## 2012-08-12 NOTE — Telephone Encounter (Signed)
Close  

## 2012-10-21 ENCOUNTER — Other Ambulatory Visit: Payer: Self-pay | Admitting: Cardiovascular Disease

## 2012-10-21 DIAGNOSIS — I4892 Unspecified atrial flutter: Secondary | ICD-10-CM

## 2012-10-21 NOTE — Telephone Encounter (Signed)
90 days supply . [facility] .

## 2012-10-22 MED ORDER — RIVAROXABAN 20 MG PO TABS: 20.0000 mg | ORAL_TABLET | Freq: Every day | ORAL | Status: DC

## 2012-10-22 NOTE — Telephone Encounter (Signed)
Fax Received. Refill Completed. Xavier Bell (R.M.A)   

## 2012-12-08 DIAGNOSIS — H02839 Dermatochalasis of unspecified eye, unspecified eyelid: Secondary | ICD-10-CM | POA: Insufficient documentation

## 2012-12-22 ENCOUNTER — Telehealth: Payer: Self-pay | Admitting: Cardiovascular Disease

## 2012-12-22 NOTE — Telephone Encounter (Signed)
Pt has a questions to ask a nurse, pls call

## 2012-12-22 NOTE — Telephone Encounter (Signed)
Follow-up:    Patient called in to follow-up on his initial call form this morning for his muscle pains that are due to his medications.  Wanted to know if he could take Aleve for his muscle pains.  Please call back.

## 2012-12-22 NOTE — Telephone Encounter (Signed)
Pt told he can take tylenol for pain, pt declined to take that, pt is on xarelto and told he should not take further NSAIDS, pt wants to take aleive and motrin, told him he should not use these, pt will think about it per end of conversation. Pt was given 6 mo ov f/u.

## 2013-01-01 ENCOUNTER — Other Ambulatory Visit: Payer: Self-pay | Admitting: *Deleted

## 2013-01-01 MED ORDER — HYDROCHLOROTHIAZIDE 25 MG PO TABS: 25.0000 mg | ORAL_TABLET | Freq: Every day | ORAL | Status: DC

## 2013-01-01 MED ORDER — AMLODIPINE BESYLATE 10 MG PO TABS: 10.0000 mg | ORAL_TABLET | Freq: Every day | ORAL | Status: DC

## 2013-01-01 NOTE — Telephone Encounter (Signed)
Fax Received. Refill Completed. Angad Nabers Chowoe (R.M.A)   

## 2013-01-28 ENCOUNTER — Other Ambulatory Visit: Payer: Self-pay | Admitting: *Deleted

## 2013-01-28 MED ORDER — POTASSIUM CHLORIDE CRYS ER 20 MEQ PO TBCR: 20.0000 meq | EXTENDED_RELEASE_TABLET | Freq: Every day | ORAL | Status: DC

## 2013-01-28 NOTE — Telephone Encounter (Signed)
Fax Received. Refill Completed. Xavier Bell (R.M.A)   

## 2013-02-06 ENCOUNTER — Ambulatory Visit: Payer: PRIVATE HEALTH INSURANCE | Admitting: Cardiovascular Disease

## 2013-02-10 ENCOUNTER — Telehealth: Payer: Self-pay | Admitting: Cardiovascular Disease

## 2013-02-10 MED ORDER — AMLODIPINE BESYLATE 10 MG PO TABS: 10.0000 mg | ORAL_TABLET | Freq: Every day | ORAL | Status: DC

## 2013-02-10 MED ORDER — FENOFIBRATE 145 MG PO TABS: 145.0000 mg | ORAL_TABLET | Freq: Every day | ORAL | Status: DC

## 2013-02-10 MED ORDER — ATORVASTATIN CALCIUM 40 MG PO TABS: 40.0000 mg | ORAL_TABLET | Freq: Every day | ORAL | Status: DC

## 2013-02-10 MED ORDER — HYDROCHLOROTHIAZIDE 25 MG PO TABS: 25.0000 mg | ORAL_TABLET | Freq: Every day | ORAL | Status: DC

## 2013-02-10 NOTE — Telephone Encounter (Signed)
New problem     4 New Rx needs [facility] go through [facility].   hyrochlorothiazide 25 mg tricor 145 mg  lipitor 40 mg  norvasc 10 mg

## 2013-02-10 NOTE — Telephone Encounter (Signed)
Refill request completed.

## 2013-03-06 ENCOUNTER — Encounter: Payer: Self-pay | Admitting: Cardiovascular Disease

## 2013-03-06 ENCOUNTER — Ambulatory Visit (INDEPENDENT_AMBULATORY_CARE_PROVIDER_SITE_OTHER): Payer: PRIVATE HEALTH INSURANCE | Admitting: Cardiovascular Disease

## 2013-03-06 VITALS — BP 146/76 | HR 62 | Ht 67.0 in | Wt 160.8 lb

## 2013-03-06 DIAGNOSIS — I4892 Unspecified atrial flutter: Secondary | ICD-10-CM

## 2013-03-06 DIAGNOSIS — I2510 Atherosclerotic heart disease of native coronary artery without angina pectoris: Secondary | ICD-10-CM

## 2013-03-06 DIAGNOSIS — I10 Essential (primary) hypertension: Secondary | ICD-10-CM

## 2013-03-06 NOTE — Assessment & Plan Note (Signed)
He remains in normal sinus rhythm. He has not had any further episodes of atrial flutter or atrial fibrillation. He still on Xarelto.  I think would be safe to discontinue Xarelto at this point.

## 2013-03-06 NOTE — Assessment & Plan Note (Signed)
Blood pressure is well-controlled.  He will continue to watch his salt intake.

## 2013-03-06 NOTE — Progress Notes (Signed)
Xavier Bell Date of Birth  1941/11/19 [facility] Office  [address]    [address]   [address] [place], [place]  [address]    [place], [place]  [address] [phone]  Fax  [phone]  [phone]  Fax [phone]  1. Coronary artery disease-status post CABG 2. Atrial flutter- s/p cardioversion 02/08/12 3. Hypertension 4. Right carotid bruit  History of Present Illness:  Has a history of atrial flutter this past spring. He status post cardioversion. [name] done very well. He continues to take Xarelto.  [name] not been exercising nearly as much as he needs to. [name] also been careless with his diet and has picked up a few pounds.  March 06, 2013:  He is doing well.  No angina.  He is continuing to have foot problems - especially of the left foot.  He has intense pain in his heel when he walks.   Current Outpatient Prescriptions on File Prior to Visit  Medication Sig Dispense Refill  . amLODipine (NORVASC) 10 MG tablet Take 1 tablet (10 mg total) by mouth daily.  90 tablet  3  . aspirin 325 MG tablet Take 325 mg by mouth daily.        [name] atorvastatin (LIPITOR) 40 MG tablet Take 1 tablet (40 mg total) by mouth daily.  90 tablet  3  . carvedilol (COREG) 25 MG tablet Take 1 tablet (25 mg total) by mouth 2 (two) times daily with a meal.  180 tablet  3  . dorzolamide-timolol (COSOPT) 22.3-6.8 MG/ML ophthalmic solution Place 2 drops into both eyes 2 (two) times daily.      . fenofibrate (TRICOR) 145 MG tablet Take 1 tablet (145 mg total) by mouth daily.  90 tablet  3  . fish oil-omega-3 fatty acids 1000 MG capsule Take by mouth daily. Taking 1400mg       . hydrochlorothiazide (HYDRODIURIL) 25 MG tablet Take 1 tablet (25 mg total) by mouth daily.  90 tablet  3  . losartan (COZAAR) 100 MG tablet Take 1 tablet (100 mg total) by mouth daily.  90 tablet  3  . Multiple Vitamin (MULTIVITAMIN) tablet Take 1 tablet by mouth daily.        . NON FORMULARY  Antihistamine Daily (off brand claritin)      . potassium chloride SA (K-DUR,KLOR-CON) 20 MEQ tablet Take 1 tablet (20 mEq total) by mouth daily.  90 tablet  3  . Rivaroxaban (XARELTO) 20 MG TABS Take 1 tablet (20 mg total) by mouth daily.  90 tablet  3  . Zinc 50 MG CAPS Take 50 mg by mouth daily.        No current facility-administered medications on file prior to visit.    Allergies  Allergen Reactions  . Niacin And Related     Stomach Pain    Past Medical History  Diagnosis Date  . Coronary artery disease     s/p remote MI in 1994 with multiple PCI and subsequent CABG x 5 in 1996  . Hypertension   . Dyslipidemia   . MI (myocardial infarction) 1995  . Retinal detachment   . Pseudoaphakia     both eyes  . Atrial flutter     s/p cardioversion 02/08/12  . Chronic anticoagulation   . Normal nuclear stress test 2006    Past Surgical History  Procedure Laterality Date  . Coronary artery bypass graft  1996    LIMA to  LAD, SVG to RV marginal & PD, SVG  to DX and SVG to Ramus  . Cardiac catheterization  1996  . Tonsillectomy and adenoidectomy    . Cardioversion  02/08/2012    Procedure: CARDIOVERSION;  Surgeon: Elyn Aquas., MD;  Location: [facility] OR;  Service: Cardiovascular;  Laterality: N/A;    History  Smoking status  . Never Smoker   Smokeless tobacco  . Not on file    History  Alcohol Use No    Family History  Problem Relation Age of Onset  . Coronary artery disease    . Heart disease Mother   . Heart disease Father     Reviw of Systems:  Reviewed in the HPI.  All other systems are negative.  Physical Exam: Blood pressure 146/76, pulse 62, height 5\' 7"  (1.702 m), weight 160 lb 12.8 oz (72.938 kg). General: Well developed, well nourished, in no acute distress.  Head: Normocephalic, atraumatic, sclera non-icteric, mucus membranes are moist,   Neck: Supple. Negative for carotid bruits. JVD not elevated.  Lungs: Clear bilaterally to auscultation without  wheezes, rales, or rhonchi. Breathing is unlabored.  Heart: RRR with S1 S2. He has a soft systolic murmur.  Abdomen: Soft, non-tender, non-distended with normoactive bowel sounds. No hepatomegaly. No rebound/guarding. No obvious abdominal masses.  Msk:  Strength and tone appear normal for age.  Extremities: No clubbing or cyanosis. No edema.  Distal pedal pulses are 2+ and equal bilaterally.  Neuro: Alert and oriented X 3. Moves all extremities spontaneously.  Psych:  Responds to questions appropriately with a normal affect.  ECG: March 06, 2013:  NSR at 7 with 1st degree AV block,   Assessment / Plan:

## 2013-03-06 NOTE — Assessment & Plan Note (Signed)
[name] not having any episodes of chest pain. We'll continue with the same medications.

## 2013-03-06 NOTE — Patient Instructions (Addendum)
Your physician has recommended you make the following change in your medication:  Stop xarelto Continue aspirin   Your physician wants you to follow-up in: 6 months ekg  You will receive a reminder letter in the mail two months in advance. If you don't receive a letter, please call our office to schedule the follow-up appointment.   Your physician recommends that you return for a FASTING lipid profile: 6 months

## 2013-06-02 ENCOUNTER — Telehealth: Payer: Self-pay | Admitting: Cardiovascular Disease

## 2013-06-02 ENCOUNTER — Other Ambulatory Visit: Payer: Self-pay | Admitting: *Deleted

## 2013-06-02 DIAGNOSIS — E785 Hyperlipidemia, unspecified: Secondary | ICD-10-CM

## 2013-06-02 NOTE — Progress Notes (Signed)
ORDERS WRITTEN FOR NEXT LAB DRAW

## 2013-06-02 NOTE — Telephone Encounter (Signed)
ORDERS SENT

## 2013-06-02 NOTE — Telephone Encounter (Signed)
New Prob     Pt requesting labs for September appt. Scheduled for 9/8. Orders needed in EPIC.

## 2013-06-29 ENCOUNTER — Other Ambulatory Visit: Payer: Self-pay | Admitting: Cardiovascular Disease

## 2013-07-01 NOTE — Telephone Encounter (Signed)
Fax Received. Refill Completed. Xavier Bell (R.M.A)   

## 2013-08-31 ENCOUNTER — Other Ambulatory Visit (INDEPENDENT_AMBULATORY_CARE_PROVIDER_SITE_OTHER): Payer: PRIVATE HEALTH INSURANCE

## 2013-08-31 DIAGNOSIS — E785 Hyperlipidemia, unspecified: Secondary | ICD-10-CM

## 2013-08-31 LAB — BASIC METABOLIC PANEL
CO2: 24 mEq/L (ref 19–32)
Calcium: 9.4 mg/dL (ref 8.4–10.5)
Chloride: 110 mEq/L (ref 96–112)
Glucose, Bld: 107 mg/dL — ABNORMAL HIGH (ref 70–99)
Potassium: 3.8 mEq/L (ref 3.5–5.1)
Sodium: 141 mEq/L (ref 135–145)

## 2013-08-31 LAB — HEPATIC FUNCTION PANEL
Albumin: 4.2 g/dL (ref 3.5–5.2)
Total Bilirubin: 0.7 mg/dL (ref 0.3–1.2)

## 2013-08-31 LAB — LIPID PANEL
HDL: 31.5 mg/dL — ABNORMAL LOW (ref >39.00)
LDL Cholesterol: 53 mg/dL (ref 0–99)
Total CHOL/HDL Ratio: 3
Triglycerides: 125 mg/dL (ref 0.0–149.0)
VLDL: 25 mg/dL (ref 0.0–40.0)

## 2013-09-04 ENCOUNTER — Encounter: Payer: Self-pay | Admitting: Cardiovascular Disease

## 2013-09-04 ENCOUNTER — Ambulatory Visit (INDEPENDENT_AMBULATORY_CARE_PROVIDER_SITE_OTHER): Payer: PRIVATE HEALTH INSURANCE | Admitting: Cardiovascular Disease

## 2013-09-04 ENCOUNTER — Encounter (INDEPENDENT_AMBULATORY_CARE_PROVIDER_SITE_OTHER): Payer: PRIVATE HEALTH INSURANCE

## 2013-09-04 VITALS — BP 140/70 | HR 62 | Ht 67.0 in | Wt 161.0 lb

## 2013-09-04 DIAGNOSIS — I779 Disorder of arteries and arterioles, unspecified: Secondary | ICD-10-CM

## 2013-09-04 DIAGNOSIS — R0989 Other specified symptoms and signs involving the circulatory and respiratory systems: Secondary | ICD-10-CM

## 2013-09-04 DIAGNOSIS — I4892 Unspecified atrial flutter: Secondary | ICD-10-CM

## 2013-09-04 DIAGNOSIS — I10 Essential (primary) hypertension: Secondary | ICD-10-CM

## 2013-09-04 DIAGNOSIS — I4891 Unspecified atrial fibrillation: Secondary | ICD-10-CM

## 2013-09-04 DIAGNOSIS — I2510 Atherosclerotic heart disease of native coronary artery without angina pectoris: Secondary | ICD-10-CM

## 2013-09-04 DIAGNOSIS — I6529 Occlusion and stenosis of unspecified carotid artery: Secondary | ICD-10-CM

## 2013-09-04 HISTORY — DX: Disorder of arteries and arterioles, unspecified: I77.9

## 2013-09-04 NOTE — Assessment & Plan Note (Addendum)
Xavier Bell is doing very well. [name] not had any episodes of chest pain. I tried to encourage and exercise on a more regular basis.  His last lipid levels revealed a mildly decreased HDL. His LDL is great. His triglyceride levels 125. I seen again in 6 months. We'll recheck lipid profile at that time.

## 2013-09-04 NOTE — Progress Notes (Signed)
Xavier Bell Date of Birth  [birth year] [facility] Office  [address]    [address]   [address] [place], [place]  [address]    [place], [place]  [address] [phone]  Fax  [phone]  [phone]  Fax [phone]  1. Coronary artery disease-status post CABG 2. Atrial flutter- s/p cardioversion 02/08/12 3. Hypertension 4. Right carotid bruit  History of Present Illness:  Has a history of atrial flutter this past spring. He status post cardioversion. [name] done very well. He continues to take Xarelto.  [name] not been exercising nearly as much as he needs to. [name] also been careless with his diet and has picked up a few pounds.  March 06, 2013:  He is doing well.  No angina.  He is continuing to have foot problems - especially of the left foot.  He has intense pain in his heel when he walks.  Sept. 12, 2014:  He has done well.  Still not exercising as much.  Takes motrin at night.  He ha left plantar faciatis.    Current Outpatient Prescriptions on File Prior to Visit  Medication Sig Dispense Refill  . amLODipine (NORVASC) 10 MG tablet Take 1 tablet (10 mg total) by mouth daily.  90 tablet  3  . aspirin 325 MG tablet Take 325 mg by mouth daily.        [name] atorvastatin (LIPITOR) 40 MG tablet Take 1 tablet (40 mg total) by mouth daily.  90 tablet  3  . carvedilol (COREG) 25 MG tablet TAKE 1 TABLET BY MOUTH TWICE DAILY WITH MEALS  180 tablet  3  . Cholecalciferol (VITAMIN D) 1000 UNITS capsule Take 4,000 Units by mouth daily.      . dorzolamide-timolol (COSOPT) 22.3-6.8 MG/ML ophthalmic solution Place 2 drops into both eyes 2 (two) times daily.      . fenofibrate (TRICOR) 145 MG tablet Take 1 tablet (145 mg total) by mouth daily.  90 tablet  3  . fish oil-omega-3 fatty acids 1000 MG capsule Take by mouth daily. Taking 1400mg       . hydrochlorothiazide (HYDRODIURIL) 25 MG tablet Take 1 tablet (25 mg total) by mouth daily.  90 tablet  3  .  losartan (COZAAR) 100 MG tablet TAKE 1 TABLET (100MG  TOTAL) BY MOUTH DAILY  90 tablet  3  . Multiple Vitamin (MULTIVITAMIN) tablet Take 1 tablet by mouth daily.        . potassium chloride SA (K-DUR,KLOR-CON) 20 MEQ tablet Take 1 tablet (20 mEq total) by mouth daily.  90 tablet  3  . Zinc 50 MG CAPS Take 50 mg by mouth daily.        No current facility-administered medications on file prior to visit.    Allergies  Allergen Reactions  . Niacin And Related     Stomach Pain    Past Medical History  Diagnosis Date  . Coronary artery disease     s/p remote MI in 1994 with multiple PCI and subsequent CABG x 5 in 1996  . Hypertension   . Dyslipidemia   . MI (myocardial infarction) 1995  . Retinal detachment   . Pseudoaphakia     both eyes  . Atrial flutter     s/p cardioversion 02/08/12  . Chronic anticoagulation   . Normal nuclear stress test 2006    Past Surgical History  Procedure Laterality Date  . Coronary artery bypass graft  1996  LIMA to LAD, SVG to RV marginal & PD, SVG  to DX and SVG to Ramus  . Cardiac catheterization  1996  . Tonsillectomy and adenoidectomy    . Cardioversion  02/08/2012    Procedure: CARDIOVERSION;  Surgeon: Elyn Aquas., MD;  Location: [facility] OR;  Service: Cardiovascular;  Laterality: N/A;    History  Smoking status  . Never Smoker   Smokeless tobacco  . Not on file    History  Alcohol Use No    Family History  Problem Relation Age of Onset  . Coronary artery disease    . Heart disease Mother   . Heart disease Father     Reviw of Systems:  Reviewed in the HPI.  All other systems are negative.  Physical Exam: Blood pressure 140/70, pulse 62, height 5\' 7"  (1.702 m), weight 161 lb (73.029 kg). General: Well developed, well nourished, in no acute distress.  Head: Normocephalic, atraumatic, sclera non-icteric, mucus membranes are moist,   Neck: Supple. Right carotic bruit. JVD not elevated.  Lungs: Clear bilaterally to  auscultation without wheezes, rales, or rhonchi. Breathing is unlabored.  Heart: RRR with S1 S2. He has a soft systolic murmur.  Abdomen: Soft, non-tender, non-distended with normoactive bowel sounds. No hepatomegaly. No rebound/guarding. No obvious abdominal masses.  Msk:  Strength and tone appear normal for age.  Extremities: No clubbing or cyanosis. No edema.  Distal pedal pulses are 2+ and equal bilaterally.  Neuro: Alert and oriented X 3. Moves all extremities spontaneously.  Psych:  Responds to questions appropriately with a normal affect.  ECG: Sept. 12, , 2014:  NSR at 51 with 1st degree AV block,  LAE, NS ST abn. Assessment / Plan:

## 2013-09-04 NOTE — Patient Instructions (Addendum)
Your physician has requested that you have a carotid duplex. This test is an ultrasound of the carotid arteries in your neck. It looks at blood flow through these arteries that supply the brain with blood. Allow one hour for this exam. There are no restrictions or special instructions.  Your physician wants you to follow-up in: 6 months  You will receive a reminder letter in the mail two months in advance. If you don't receive a letter, please call our office to schedule the follow-up appointment.  Your physician recommends that you return for a FASTING lipid profile: 6 months

## 2013-09-04 NOTE — Assessment & Plan Note (Signed)
He has a moderately loud right carotid bruit. We have area for this in the past. Date performed duplex scans in the past but it's been several years.  We'll schedule him for a carotid duplex study. Will continue to be aggressive with his cholesterol management.

## 2013-09-07 ENCOUNTER — Telehealth: Payer: Self-pay | Admitting: Cardiovascular Disease

## 2013-09-07 NOTE — Telephone Encounter (Signed)
Pt was given lab results 

## 2013-09-07 NOTE — Telephone Encounter (Signed)
Returning nurse call/saf  +

## 2013-09-30 DIAGNOSIS — H401122 Primary open-angle glaucoma, left eye, moderate stage: Secondary | ICD-10-CM | POA: Insufficient documentation

## 2013-11-03 ENCOUNTER — Telehealth: Payer: Self-pay | Admitting: *Deleted

## 2013-11-03 NOTE — Telephone Encounter (Signed)
Needs repeat in 1 year order written.

## 2013-11-03 NOTE — Telephone Encounter (Signed)
Message copied by Antony Odea on Tue Nov 03, 2013  5:53 PM ------      Message from: Mariane Masters D      Created: Fri Sep 04, 2013  2:28 PM      Regarding: CAROTID       09/04/13 Patient appointment is today. ------

## 2014-01-29 ENCOUNTER — Other Ambulatory Visit: Payer: Self-pay | Admitting: *Deleted

## 2014-01-29 DIAGNOSIS — E785 Hyperlipidemia, unspecified: Secondary | ICD-10-CM

## 2014-01-29 DIAGNOSIS — I2510 Atherosclerotic heart disease of native coronary artery without angina pectoris: Secondary | ICD-10-CM

## 2014-02-10 ENCOUNTER — Other Ambulatory Visit: Payer: Self-pay

## 2014-02-10 MED ORDER — FENOFIBRATE 145 MG PO TABS: 145.0000 mg | ORAL_TABLET | Freq: Every day | ORAL | Status: DC

## 2014-02-10 MED ORDER — ATORVASTATIN CALCIUM 40 MG PO TABS: 40.0000 mg | ORAL_TABLET | Freq: Every day | ORAL | Status: DC

## 2014-03-15 ENCOUNTER — Other Ambulatory Visit (INDEPENDENT_AMBULATORY_CARE_PROVIDER_SITE_OTHER): Payer: PRIVATE HEALTH INSURANCE

## 2014-03-15 DIAGNOSIS — I2510 Atherosclerotic heart disease of native coronary artery without angina pectoris: Secondary | ICD-10-CM

## 2014-03-15 DIAGNOSIS — E785 Hyperlipidemia, unspecified: Secondary | ICD-10-CM

## 2014-03-15 LAB — HEPATIC FUNCTION PANEL
ALBUMIN: 4.2 g/dL (ref 3.5–5.2)
ALT: 24 U/L (ref 0–53)
AST: 30 U/L (ref 0–37)
Alkaline Phosphatase: 54 U/L (ref 39–117)
Bilirubin, Direct: 0.1 mg/dL (ref 0.0–0.3)
TOTAL PROTEIN: 6.9 g/dL (ref 6.0–8.3)
Total Bilirubin: 0.7 mg/dL (ref 0.3–1.2)

## 2014-03-15 LAB — BASIC METABOLIC PANEL
BUN: 31 mg/dL — ABNORMAL HIGH (ref 6–23)
CALCIUM: 9.8 mg/dL (ref 8.4–10.5)
CO2: 26 mEq/L (ref 19–32)
Chloride: 109 mEq/L (ref 96–112)
Creatinine, Ser: 1.5 mg/dL (ref 0.4–1.5)
GFR: 47.39 mL/min — AB (ref >60.00)
GLUCOSE: 112 mg/dL — AB (ref 70–99)
Potassium: 3.9 mEq/L (ref 3.5–5.1)
SODIUM: 142 meq/L (ref 135–145)

## 2014-03-15 LAB — LIPID PANEL
Cholesterol: 94 mg/dL (ref 0–200)
HDL: 32 mg/dL — AB (ref >39.00)
LDL Cholesterol: 47 mg/dL (ref 0–99)
TRIGLYCERIDES: 76 mg/dL (ref 0.0–149.0)
Total CHOL/HDL Ratio: 3
VLDL: 15.2 mg/dL (ref 0.0–40.0)

## 2014-03-19 ENCOUNTER — Encounter: Payer: Self-pay | Admitting: Cardiovascular Disease

## 2014-03-19 ENCOUNTER — Ambulatory Visit (INDEPENDENT_AMBULATORY_CARE_PROVIDER_SITE_OTHER): Payer: PRIVATE HEALTH INSURANCE | Admitting: Cardiovascular Disease

## 2014-03-19 ENCOUNTER — Ambulatory Visit: Payer: PRIVATE HEALTH INSURANCE | Admitting: Cardiovascular Disease

## 2014-03-19 VITALS — BP 138/80 | HR 64 | Ht 67.0 in | Wt 157.8 lb

## 2014-03-19 DIAGNOSIS — I10 Essential (primary) hypertension: Secondary | ICD-10-CM

## 2014-03-19 DIAGNOSIS — I2510 Atherosclerotic heart disease of native coronary artery without angina pectoris: Secondary | ICD-10-CM

## 2014-03-19 DIAGNOSIS — I739 Peripheral vascular disease, unspecified: Principal | ICD-10-CM

## 2014-03-19 DIAGNOSIS — I779 Disorder of arteries and arterioles, unspecified: Secondary | ICD-10-CM

## 2014-03-19 MED ORDER — AMLODIPINE BESYLATE 10 MG PO TABS: 10.0000 mg | ORAL_TABLET | Freq: Every day | ORAL | Status: DC

## 2014-03-19 MED ORDER — POTASSIUM CHLORIDE CRYS ER 20 MEQ PO TBCR: 20.0000 meq | EXTENDED_RELEASE_TABLET | Freq: Every day | ORAL | Status: DC

## 2014-03-19 MED ORDER — HYDROCHLOROTHIAZIDE 25 MG PO TABS: 25.0000 mg | ORAL_TABLET | Freq: Every day | ORAL | Status: DC

## 2014-03-19 NOTE — Assessment & Plan Note (Signed)
His blood pressure is fairly well controlled. Continue with current medications. I've encouraged him to ambulate on occasion.

## 2014-03-19 NOTE — Assessment & Plan Note (Signed)
[name] not having episodes of chest pain or shortness breath. He does have a bit of an upper respiratory tract infection. He seems to be recovering. His blood pressure and heart rate are well controlled. His most recent cholesterol levels were drawn earlier this week and is levels are very good. His HDL remains mildly depressed which is standard for him.  He will continue the same medications. I'll see him again in 6 months with lipids, liver enzymes, and basic metabolic profile drawn a week before

## 2014-03-19 NOTE — Progress Notes (Signed)
Xavier Bell Date of Birth  06/13/1941 [place] [address]    [place]   [place], [place]  [address]    [place], [place]  [address] [phone]  Fax  [phone]  [phone]  Fax [phone]  1. Coronary artery disease-status post CABG 2. Atrial flutter- s/p cardioversion 02/08/12 3. Hypertension 4. Right carotid bruit  History of Present Illness:  Has a history of atrial flutter this past spring. He status post cardioversion. [name] done very well. He continues to take Xarelto.  [name] not been exercising nearly as much as he needs to. [name] also been careless with his diet and has picked up a few pounds.  March 06, 2013:  He is doing well.  No angina.  He is continuing to have foot problems - especially of the left foot.  He has intense pain in his heel when he walks.  Sept. 12, 2014:  He has done well.  Still not exercising as much.  Takes motrin at night.  He ha left plantar faciatis.    March 19, 2014:    Current Outpatient Prescriptions on File Prior to Visit  Medication Sig Dispense Refill  . amLODipine (NORVASC) 10 MG tablet Take 1 tablet (10 mg total) by mouth daily.  90 tablet  3  . aspirin 325 MG tablet Take 325 mg by mouth daily.        [name] atorvastatin (LIPITOR) 40 MG tablet Take 1 tablet (40 mg total) by mouth daily.  90 tablet  3  . Calcium Carbonate Antacid (ALKA-SELTZER ANTACID PO) Take by mouth as needed.      . carvedilol (COREG) 25 MG tablet TAKE 1 TABLET BY MOUTH TWICE DAILY WITH MEALS  180 tablet  3  . Cholecalciferol (VITAMIN D) 1000 UNITS capsule Take 4,000 Units by mouth daily.      . dorzolamide-timolol (COSOPT) 22.3-6.8 MG/ML ophthalmic solution Place 2 drops into both eyes 2 (two) times daily.      . fenofibrate (TRICOR) 145 MG tablet Take 1 tablet (145 mg total) by mouth daily.  90 tablet  3  . fish oil-omega-3 fatty acids 1000 MG capsule Take by mouth daily. Taking 1400mg       .  hydrochlorothiazide (HYDRODIURIL) 25 MG tablet Take 1 tablet (25 mg total) by mouth daily.  90 tablet  3  . ibuprofen (ADVIL,MOTRIN) 400 MG tablet Take 400 mg by mouth as needed for pain.      [name] losartan (COZAAR) 100 MG tablet TAKE 1 TABLET (100MG  TOTAL) BY MOUTH DAILY  90 tablet  3  . Magnesium 400 MG CAPS Take 1 tablet by mouth daily.      . Melatonin 5 MG TABS Take 1 tablet by mouth daily.      . Multiple Vitamin (MULTIVITAMIN) tablet Take 1 tablet by mouth daily.        . potassium chloride SA (K-DUR,KLOR-CON) 20 MEQ tablet Take 1 tablet (20 mEq total) by mouth daily.  90 tablet  3  . Probiotic Product (PROBIOTIC PO) Take 1 tablet by mouth daily.      . Zinc 50 MG CAPS Take 50 mg by mouth daily.        No current facility-administered medications on file prior to visit.    Allergies  Allergen Reactions  . Niacin And Related     Stomach Pain    Past Medical History  Diagnosis Date  . Coronary  artery disease     s/p remote MI in 1994 with multiple PCI and subsequent CABG x 5 in 1996  . Hypertension   . Dyslipidemia   . MI (myocardial infarction) 1995  . Retinal detachment   . Pseudoaphakia     both eyes  . Atrial flutter     s/p cardioversion 02/08/12  . Chronic anticoagulation   . Normal nuclear stress test 2006    Past Surgical History  Procedure Laterality Date  . Coronary artery bypass graft  1996    LIMA to LAD, SVG to RV marginal & PD, SVG  to DX and SVG to Ramus  . Cardiac catheterization  1996  . Tonsillectomy and adenoidectomy    . Cardioversion  02/08/2012    Procedure: CARDIOVERSION;  Surgeon: Darden Amber., MD;  Location: [facility] OR;  Service: Cardiovascular;  Laterality: N/A;    History  Smoking status  . Never Smoker   Smokeless tobacco  . Not on file    History  Alcohol Use No    Family History  Problem Relation Age of Onset  . Coronary artery disease    . Heart disease Mother   . Heart disease Father     Reviw of Systems:  Reviewed in the  HPI.  All other systems are negative.  Physical Exam: Blood pressure 138/80, pulse 64, height 5\' 7"  (1.702 m), weight 157 lb 12.8 oz (71.578 kg). General: Well developed, well nourished, in no acute distress.  Head: Normocephalic, atraumatic, sclera non-icteric, mucus membranes are moist,   Neck: Supple. Right carotic bruit. JVD not elevated.  Lungs: Clear bilaterally to auscultation without wheezes, rales, or rhonchi. Breathing is unlabored.  Heart: RRR with S1 S2. He has a soft systolic murmur.  Abdomen: Soft, non-tender, non-distended with normoactive bowel sounds. No hepatomegaly. No rebound/guarding. No obvious abdominal masses.  Msk:  Strength and tone appear normal for age.  Extremities: No clubbing or cyanosis. No edema.  Distal pedal pulses are 2+ and equal bilaterally.  Neuro: Alert and oriented X 3. Moves all extremities spontaneously.  Psych:  Responds to questions appropriately with a normal affect.  ECG: Sept. 12, , 2014:  NSR at 53 with 1st degree AV block,  LAE, NS ST abn. Assessment / Plan:

## 2014-03-19 NOTE — Patient Instructions (Signed)
Your physician wants you to follow-up in: 6 months schedule fasting lab work 1 week before appointment. You will receive a reminder letter in the mail two months in advance. If you don't receive a letter, please call our office to schedule the follow-up appointment.

## 2014-03-19 NOTE — Assessment & Plan Note (Addendum)
[name] is doing well. [name]

## 2014-06-10 ENCOUNTER — Other Ambulatory Visit: Payer: Self-pay | Admitting: Cardiovascular Disease

## 2014-07-19 ENCOUNTER — Other Ambulatory Visit: Payer: Self-pay | Admitting: *Deleted

## 2014-07-19 MED ORDER — CARVEDILOL 25 MG PO TABS: 25.0000 mg | ORAL_TABLET | Freq: Two times a day (BID) | ORAL | Status: DC

## 2014-10-07 ENCOUNTER — Other Ambulatory Visit: Payer: Self-pay

## 2014-10-07 MED ORDER — LOSARTAN POTASSIUM 100 MG PO TABS: ORAL_TABLET | ORAL | Status: DC

## 2014-11-01 ENCOUNTER — Other Ambulatory Visit (INDEPENDENT_AMBULATORY_CARE_PROVIDER_SITE_OTHER): Payer: PRIVATE HEALTH INSURANCE | Admitting: *Deleted

## 2014-11-01 DIAGNOSIS — I739 Peripheral vascular disease, unspecified: Principal | ICD-10-CM

## 2014-11-01 DIAGNOSIS — I779 Disorder of arteries and arterioles, unspecified: Secondary | ICD-10-CM

## 2014-11-01 LAB — LIPID PANEL
CHOLESTEROL: 86 mg/dL (ref 0–200)
HDL: 26 mg/dL — ABNORMAL LOW (ref >39.00)
LDL Cholesterol: 47 mg/dL (ref 0–99)
NONHDL: 60
Total CHOL/HDL Ratio: 3
Triglycerides: 67 mg/dL (ref 0.0–149.0)
VLDL: 13.4 mg/dL (ref 0.0–40.0)

## 2014-11-01 LAB — BASIC METABOLIC PANEL
BUN: 32 mg/dL — AB (ref 6–23)
CALCIUM: 9.4 mg/dL (ref 8.4–10.5)
CO2: 22 mEq/L (ref 19–32)
CREATININE: 1.3 mg/dL (ref 0.4–1.5)
Chloride: 110 mEq/L (ref 96–112)
GFR: 58.65 mL/min — ABNORMAL LOW (ref >60.00)
GLUCOSE: 108 mg/dL — AB (ref 70–99)
Potassium: 3.9 mEq/L (ref 3.5–5.1)
Sodium: 142 mEq/L (ref 135–145)

## 2014-11-01 LAB — HEPATIC FUNCTION PANEL
ALK PHOS: 78 U/L (ref 39–117)
ALT: 22 U/L (ref 0–53)
AST: 31 U/L (ref 0–37)
Albumin: 3.6 g/dL (ref 3.5–5.2)
BILIRUBIN DIRECT: 0.1 mg/dL (ref 0.0–0.3)
BILIRUBIN TOTAL: 0.7 mg/dL (ref 0.2–1.2)
Total Protein: 7 g/dL (ref 6.0–8.3)

## 2014-11-05 ENCOUNTER — Encounter: Payer: Self-pay | Admitting: Cardiovascular Disease

## 2014-11-05 ENCOUNTER — Ambulatory Visit (INDEPENDENT_AMBULATORY_CARE_PROVIDER_SITE_OTHER): Payer: PRIVATE HEALTH INSURANCE | Admitting: Cardiovascular Disease

## 2014-11-05 VITALS — BP 156/70 | HR 58 | Ht 67.0 in | Wt 160.2 lb

## 2014-11-05 DIAGNOSIS — I483 Typical atrial flutter: Secondary | ICD-10-CM

## 2014-11-05 DIAGNOSIS — I2510 Atherosclerotic heart disease of native coronary artery without angina pectoris: Secondary | ICD-10-CM

## 2014-11-05 NOTE — Assessment & Plan Note (Signed)
He is doing well from a cardiac status. No CP, no dyspnea.   His lipids are well controlled.   Continue same meds. Encouraged him to exercise more.

## 2014-11-05 NOTE — Progress Notes (Signed)
Stephannie Li Date of Birth  Apr 02, 1941 [place] [address]    [place]   [place], [place]  [address]    [place],   [address] [phone]  Fax  [phone]  [phone]  Fax [phone]  1. Coronary artery disease-status post CABG 2. Atrial flutter- s/p cardioversion 02/08/12 3. Hypertension 4. Right carotid bruit  History of Present Illness:  Has a history of atrial flutter this past spring. He status post cardioversion. [name] done very well. He continues to take Xarelto.  [name] not been exercising nearly as much as he needs to. [name] also been careless with his diet and has picked up a few pounds.  March 06, 2013:  He is doing well.  No angina.  He is continuing to have foot problems - especially of the left foot.  He has intense pain in his heel when he walks.  Sept. 12, 2014:  He has done well.  Still not exercising as much.  Takes motrin at night.  He has left plantar faciatis.    March 19, 2014:  [name] is doing ok No CP or dyspnea.  No palpitations. He has gone back into atrial flutter.   He cannot tell that his HR is irreg.   He has a hx of a-flutter in the past ( 2014) and was treated with Xarelto and was cardioverted.    Current Outpatient Prescriptions on File Prior to Visit  Medication Sig Dispense Refill  . amLODipine (NORVASC) 10 MG tablet Take 1 tablet (10 mg total) by mouth daily. 90 tablet 3  . aspirin 325 MG tablet Take 325 mg by mouth daily.      [name] atorvastatin (LIPITOR) 40 MG tablet Take 1 tablet (40 mg total) by mouth daily. 90 tablet 3  . carvedilol (COREG) 25 MG tablet Take 1 tablet (25 mg total) by mouth 2 (two) times daily with a meal. 180 tablet 3  . Cholecalciferol (VITAMIN D) 1000 UNITS capsule Take 2,000 Units by mouth daily.     . dorzolamide-timolol (COSOPT) 22.3-6.8 MG/ML ophthalmic solution Place 2 drops into both eyes 2 (two) times daily.    . fenofibrate (TRICOR) 145 MG  tablet Take 1 tablet (145 mg total) by mouth daily. 90 tablet 3  . fish oil-omega-3 fatty acids 1000 MG capsule Take by mouth daily. Taking 1400mg     . hydrochlorothiazide (HYDRODIURIL) 25 MG tablet Take 1 tablet (25 mg total) by mouth daily. 90 tablet 3  . ibuprofen (ADVIL,MOTRIN) 400 MG tablet Take 400 mg by mouth as needed for pain.    [name] losartan (COZAAR) 100 MG tablet TAKE 1 TABLET DAILY 90 tablet 3  . Magnesium 400 MG CAPS Take 1 tablet by mouth daily.    . Multiple Vitamin (MULTIVITAMIN) tablet Take 1 tablet by mouth daily.      . potassium chloride SA (K-DUR,KLOR-CON) 20 MEQ tablet Take 1 tablet (20 mEq total) by mouth daily. 90 tablet 3  . Probiotic Product (PROBIOTIC PO) Take 1 tablet by mouth daily.    . Zinc 50 MG CAPS Take 50 mg by mouth daily.      No current facility-administered medications on file prior to visit.    Allergies  Allergen Reactions  . Niacin And Related     Stomach Pain    Past Medical History  Diagnosis Date  . Coronary artery disease     s/p remote MI in 1994 with  multiple PCI and subsequent CABG x 5 in 1996  . Hypertension   . Dyslipidemia   . MI (myocardial infarction) 1995  . Retinal detachment   . Pseudoaphakia     both eyes  . Atrial flutter     s/p cardioversion 02/08/12  . Chronic anticoagulation   . Normal nuclear stress test 2006    Past Surgical History  Procedure Laterality Date  . Coronary artery bypass graft  1996    LIMA to LAD, SVG to RV marginal & PD, SVG  to DX and SVG to Ramus  . Cardiac catheterization  1996  . Tonsillectomy and adenoidectomy    . Cardioversion  02/08/2012    Procedure: CARDIOVERSION;  Surgeon: Darden Amber., MD;  Location: [facility] OR;  Service: Cardiovascular;  Laterality: N/A;    History  Smoking status  . Never Smoker   Smokeless tobacco  . Not on file    History  Alcohol Use No    Family History  Problem Relation Age of Onset  . Coronary artery disease    . Heart disease Mother   . Heart  disease Father     Reviw of Systems:  Reviewed in the HPI.  All other systems are negative.  Physical Exam: Blood pressure 156/70, pulse 58, height 5\' 7"  (1.702 m), weight 160 lb 3.2 oz (72.666 kg). General: Well developed, well nourished, in no acute distress.  Head: Normocephalic, atraumatic, sclera non-icteric, mucus membranes are moist,   Neck: Supple. Right carotic bruit. JVD not elevated.  Lungs: Clear bilaterally to auscultation without wheezes, rales, or rhonchi. Breathing is unlabored.  Heart: RR with periods of irreg with a normal S1 S2. He has a soft systolic murmur.  Abdomen: Soft, non-tender, non-distended with normoactive bowel sounds. No hepatomegaly. No rebound/guarding. No obvious abdominal masses.  Msk:  Strength and tone appear normal for age.  Extremities: No clubbing or cyanosis. No edema.  Distal pedal pulses are 2+ and equal bilaterally.  Neuro: Alert and oriented X 3. Moves all extremities spontaneously.  Psych:  Responds to questions appropriately with a normal affect.  ECG: Nov. 13, 2015:  Atrial flutter with V rate of 58.   Assessment / Plan:

## 2014-11-05 NOTE — Assessment & Plan Note (Addendum)
[name] is back in atrial flutter.  He is tolerating it very well.  He has not symptoms. Will start Xarelto 20 a day.  He has taken Xarelto 20 in the past.  He will hold his ASA.    I have discussed with Dr. Cristopher Peru. 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 He is hoping to have the ablation before a major boot convention in January.     His rate is well controlled and he is stable if he needs to be done in several months.   I will see him in 6 months for follow up visit.

## 2014-11-05 NOTE — Patient Instructions (Addendum)
Your physician has recommended you make the following change in your medication:  STOP Aspirin START Xarelto 20 mg once daily - Call Christen Bame, RN at [phone] if you need refills   You have been referred to Dr. Lovena Le on Wed. Nov. 25 at 8:15 am   Your physician wants you to follow-up in: 6 months with Dr. Acie Fredrickson.  You will receive a reminder letter in the mail two months in advance. If you don't receive a letter, please call our office to schedule the follow-up appointment.

## 2014-11-08 ENCOUNTER — Telehealth: Payer: Self-pay | Admitting: Cardiovascular Disease

## 2014-11-08 NOTE — Telephone Encounter (Signed)
New [place]       Pt calling stating he has 67 days of Zorelto. If you have any other questions please call pt on mobile number.

## 2014-11-17 ENCOUNTER — Ambulatory Visit (INDEPENDENT_AMBULATORY_CARE_PROVIDER_SITE_OTHER): Payer: PRIVATE HEALTH INSURANCE | Admitting: Internal Medicine

## 2014-11-17 ENCOUNTER — Encounter: Payer: Self-pay | Admitting: *Deleted

## 2014-11-17 ENCOUNTER — Encounter: Payer: Self-pay | Admitting: Internal Medicine

## 2014-11-17 VITALS — BP 132/60 | HR 70 | Ht 67.0 in | Wt 164.4 lb

## 2014-11-17 DIAGNOSIS — I25810 Atherosclerosis of coronary artery bypass graft(s) without angina pectoris: Secondary | ICD-10-CM

## 2014-11-17 DIAGNOSIS — I483 Typical atrial flutter: Secondary | ICD-10-CM

## 2014-11-17 DIAGNOSIS — I4892 Unspecified atrial flutter: Secondary | ICD-10-CM

## 2014-11-17 NOTE — Assessment & Plan Note (Signed)
His ventricular rate is well controlled. He has symptomatic atrial flutter despite medical therapy. He is on systemic anti-coagulation. I have discussed the risk, goals, benefits, and expectations of catheter ablation of atrial flutter with the patient. He would like to proceed. This will be scheduled next several weeks. He will continue systemic anticoagulation for now, with plans to discontinue this 3-4 weeks after ablation.

## 2014-11-17 NOTE — Addendum Note (Signed)
Addended by: Alvis Lemmings C on: 11/17/2014 09:02 AM   Modules accepted: Orders

## 2014-11-17 NOTE — Patient Instructions (Signed)
Your physician has recommended that you have an ablation. Catheter ablation is a medical procedure used to treat some cardiac arrhythmias (irregular heartbeats). During catheter ablation, a long, thin, flexible tube is put into a blood vessel in your groin (upper thigh), or neck. This tube is called an ablation catheter. It is then guided to your heart through the blood vessel. Radio frequency waves destroy small areas of heart tissue where abnormal heartbeats may cause an arrhythmia to start. Please see the instruction sheet given to you today.  Your physician recommends that you continue on your current medications as directed. Please refer to the Current Medication list given to you today.

## 2014-11-17 NOTE — Assessment & Plan Note (Signed)
He denies anginal symptoms. He will continue his current medical therapy. 

## 2014-11-17 NOTE — Progress Notes (Signed)
HPI Xavier Bell is referred today by Dr. Acie Fredrickson for evaluation of recurrent atrial flutter. He is a 73 year old man with known coronary disease, status post coronary artery bypass grafting, who had atrial flutter initially in 2014. He has been anticoagulated. He was found to be a recurrent atrial flutter several weeks ago. He is referred today to consider catheter ablation. He notes some fatigue and weakness but no edema. He does not feel palpitations.  Allergies  Allergen Reactions  . Niacin And Related Other (See Comments)    Stomach Pain     Current Outpatient Prescriptions  Medication Sig Dispense Refill  . amLODipine (NORVASC) 10 MG tablet Take 1 tablet (10 mg total) by mouth daily. 90 tablet 3  . atorvastatin (LIPITOR) 40 MG tablet Take 1 tablet (40 mg total) by mouth daily. 90 tablet 3  . carvedilol (COREG) 25 MG tablet Take 1 tablet (25 mg total) by mouth 2 (two) times daily with a meal. 180 tablet 3  . Cholecalciferol (VITAMIN D) 1000 UNITS capsule Take 2,000 Units by mouth daily.     . dorzolamide-timolol (COSOPT) 22.3-6.8 MG/ML ophthalmic solution Place 2 drops into both eyes 2 (two) times daily.    . fenofibrate (TRICOR) 145 MG tablet Take 1 tablet (145 mg total) by mouth daily. 90 tablet 3  . fish oil-omega-3 fatty acids 1000 MG capsule Take by mouth daily. Taking 1400mg     . hydrochlorothiazide (HYDRODIURIL) 25 MG tablet Take 1 tablet (25 mg total) by mouth daily. 90 tablet 3  . ibuprofen (ADVIL,MOTRIN) 200 MG tablet Take 200 mg by mouth every 6 (six) hours as needed.    [name] losartan (COZAAR) 100 MG tablet TAKE 1 TABLET DAILY 90 tablet 3  . Magnesium 400 MG CAPS Take 1 tablet by mouth daily. Possible 50 mg.. Pt unsure.    . Multiple Vitamin (MULTIVITAMIN) tablet Take 1 tablet by mouth daily.      . potassium chloride SA (K-DUR,KLOR-CON) 20 MEQ tablet Take 1 tablet (20 mEq total) by mouth daily. 90 tablet 3  . Probiotic Product (PROBIOTIC PO) Take 1 tablet by mouth  daily.    . rivaroxaban (XARELTO) 20 MG TABS tablet Take 20 mg by mouth daily.    . Zinc 50 MG CAPS Take 50 mg by mouth daily.      No current facility-administered medications for this visit.     Past Medical History  Diagnosis Date  . Coronary artery disease     s/p remote MI in 1994 with multiple PCI and subsequent CABG x 5 in 1996  . Hypertension   . Dyslipidemia   . MI (myocardial infarction) 1995  . Retinal detachment   . Pseudoaphakia     both eyes  . Atrial flutter     s/p cardioversion 02/08/12  . Chronic anticoagulation   . Normal nuclear stress test 2006    ROS:   All systems reviewed and negative except as noted in the HPI.   Past Surgical History  Procedure Laterality Date  . Coronary artery bypass graft  1996    LIMA to LAD, SVG to RV marginal & PD, SVG  to DX and SVG to Ramus  . Cardiac catheterization  1996  . Tonsillectomy and adenoidectomy    . Cardioversion  02/08/2012    Procedure: CARDIOVERSION;  Surgeon: Darden Amber., MD;  Location: [facility] OR;  Service: Cardiovascular;  Laterality: N/A;     Family History  Problem Relation Age of Onset  .  Coronary artery disease    . Heart disease Mother   . Heart disease Father      History   Social History  . Marital Status: Married    Spouse Name: N/A    Number of Children: N/A  . Years of Education: N/A   Occupational History  . Not on file.   Social History Main Topics  . Smoking status: Never Smoker   . Smokeless tobacco: Not on file  . Alcohol Use: No  . Drug Use: No  . Sexual Activity: Not on file   Other Topics Concern  . Not on file   Social History Narrative     There were no vitals taken for this visit.  Physical Exam:  Well appearing 73 yo man, NAD HEENT: Unremarkable Neck:  No JVD, no thyromegally Back:  No CVA tenderness Lungs:  Clear with no wheezes HEART:  IRegular rate rhythm, no murmurs, no rubs, no clicks Abd:  soft, positive bowel sounds, no organomegally, no  rebound, no guarding Ext:  2 plus pulses, no edema, no cyanosis, no clubbing Skin:  No rashes no nodules Neuro:  CN II through XII intact, motor grossly intact  EKG - atrial flutter with 4:1 AV conduction.  Assess/Plan:

## 2014-11-22 ENCOUNTER — Telehealth: Payer: Self-pay | Admitting: Internal Medicine

## 2014-11-22 ENCOUNTER — Other Ambulatory Visit: Payer: Self-pay | Admitting: *Deleted

## 2014-11-22 NOTE — Telephone Encounter (Signed)
New problem   Pt want to know if he can change his cather ablation to 12/23/14. Please advise pt.

## 2014-11-22 NOTE — Telephone Encounter (Signed)
He will call me back tomorrow by 12 noon with his decision as to weather he will change to 12/31 or leave as is

## 2014-11-23 ENCOUNTER — Encounter: Payer: Self-pay | Admitting: Internal Medicine

## 2014-11-23 NOTE — Telephone Encounter (Signed)
He is going to keep is procedure as scheduled

## 2014-11-23 NOTE — Telephone Encounter (Signed)
New message          Pt would like for kelly to give him a call to set up a procedure

## 2014-11-23 NOTE — Telephone Encounter (Signed)
This encounter was created in error - please disregard.

## 2014-12-06 ENCOUNTER — Other Ambulatory Visit (INDEPENDENT_AMBULATORY_CARE_PROVIDER_SITE_OTHER): Payer: PRIVATE HEALTH INSURANCE | Admitting: *Deleted

## 2014-12-06 DIAGNOSIS — I483 Typical atrial flutter: Secondary | ICD-10-CM

## 2014-12-06 LAB — BASIC METABOLIC PANEL
BUN: 29 mg/dL — AB (ref 6–23)
CALCIUM: 9.6 mg/dL (ref 8.4–10.5)
CO2: 22 mEq/L (ref 19–32)
CREATININE: 1 mg/dL (ref 0.4–1.5)
Chloride: 107 mEq/L (ref 96–112)
GFR: 73.84 mL/min (ref >60.00)
GLUCOSE: 77 mg/dL (ref 70–99)
Potassium: 3.9 mEq/L (ref 3.5–5.1)
Sodium: 137 mEq/L (ref 135–145)

## 2014-12-06 LAB — CBC WITH DIFFERENTIAL/PLATELET
BASOS ABS: 0 10*3/uL (ref 0.0–0.1)
Basophils Relative: 0.5 % (ref 0.0–3.0)
EOS PCT: 2.9 % (ref 0.0–5.0)
Eosinophils Absolute: 0.2 10*3/uL (ref 0.0–0.7)
HCT: 38.5 % — ABNORMAL LOW (ref 39.0–52.0)
Hemoglobin: 12.8 g/dL — ABNORMAL LOW (ref 13.0–17.0)
LYMPHS PCT: 29.8 % (ref 12.0–46.0)
Lymphs Abs: 1.6 10*3/uL (ref 0.7–4.0)
MCHC: 33.2 g/dL (ref 30.0–36.0)
MCV: 89.8 fl (ref 78.0–100.0)
MONOS PCT: 14.2 % — AB (ref 3.0–12.0)
Monocytes Absolute: 0.8 10*3/uL (ref 0.1–1.0)
NEUTROS PCT: 52.6 % (ref 43.0–77.0)
Neutro Abs: 2.9 10*3/uL (ref 1.4–7.7)
PLATELETS: 268 10*3/uL (ref 150.0–400.0)
RBC: 4.29 Mil/uL (ref 4.22–5.81)
RDW: 13.7 % (ref 11.5–15.5)
WBC: 5.5 10*3/uL (ref 4.0–10.5)

## 2014-12-08 ENCOUNTER — Other Ambulatory Visit: Payer: Self-pay

## 2014-12-08 ENCOUNTER — Ambulatory Visit (HOSPITAL_COMMUNITY)
Admission: RE | Admit: 2014-12-08 | Discharge: 2014-12-08 | Disposition: A | Discharge status: Home / Self Care | Payer: No Typology Code available for payment source | Source: Ambulatory Visit | Attending: Internal Medicine | Admitting: Internal Medicine

## 2014-12-08 ENCOUNTER — Encounter (HOSPITAL_COMMUNITY): Payer: Self-pay | Admitting: Internal Medicine

## 2014-12-08 ENCOUNTER — Encounter (HOSPITAL_COMMUNITY)
Admission: RE | Disposition: A | Discharge status: Home / Self Care | Payer: Self-pay | Source: Ambulatory Visit | Attending: Internal Medicine

## 2014-12-08 DIAGNOSIS — I252 Old myocardial infarction: Secondary | ICD-10-CM | POA: Insufficient documentation

## 2014-12-08 DIAGNOSIS — Z7901 Long term (current) use of anticoagulants: Secondary | ICD-10-CM | POA: Insufficient documentation

## 2014-12-08 DIAGNOSIS — E785 Hyperlipidemia, unspecified: Secondary | ICD-10-CM | POA: Insufficient documentation

## 2014-12-08 DIAGNOSIS — I4892 Unspecified atrial flutter: Secondary | ICD-10-CM | POA: Insufficient documentation

## 2014-12-08 DIAGNOSIS — I10 Essential (primary) hypertension: Secondary | ICD-10-CM | POA: Insufficient documentation

## 2014-12-08 DIAGNOSIS — Z951 Presence of aortocoronary bypass graft: Secondary | ICD-10-CM | POA: Diagnosis not present

## 2014-12-08 DIAGNOSIS — I2510 Atherosclerotic heart disease of native coronary artery without angina pectoris: Secondary | ICD-10-CM | POA: Diagnosis not present

## 2014-12-08 DIAGNOSIS — I483 Typical atrial flutter: Secondary | ICD-10-CM

## 2014-12-08 HISTORY — PX: ATRIAL FLUTTER ABLATION: SHX5733

## 2014-12-08 SURGERY — ATRIAL FLUTTER ABLATION
Anesthesia: LOCAL

## 2014-12-08 MED ORDER — DORZOLAMIDE HCL-TIMOLOL MAL 2-0.5 % OP SOLN
2.0000 [drp] | Freq: Two times a day (BID) | OPHTHALMIC | Status: DC
Start: 2014-12-08 — End: 2014-12-08
  Filled 2014-12-08: qty 10

## 2014-12-08 MED ORDER — SODIUM CHLORIDE 0.9 % IV SOLN: 250.0000 mL | INTRAVENOUS | Status: DC | PRN

## 2014-12-08 MED ORDER — HEPARIN (PORCINE) IN NACL 2-0.9 UNIT/ML-% IJ SOLN
INTRAMUSCULAR | Status: AC
  Filled 2014-12-08: qty 500

## 2014-12-08 MED ORDER — MIDAZOLAM HCL 5 MG/5ML IJ SOLN
INTRAMUSCULAR | Status: AC
  Filled 2014-12-08: qty 5

## 2014-12-08 MED ORDER — FENTANYL CITRATE 0.05 MG/ML IJ SOLN
INTRAMUSCULAR | Status: AC
  Filled 2014-12-08: qty 2

## 2014-12-08 MED ORDER — ATORVASTATIN CALCIUM 40 MG PO TABS
40.0000 mg | ORAL_TABLET | Freq: Every day | ORAL | Status: DC
  Administered 2014-12-08: 40 mg via ORAL
  Filled 2014-12-08: qty 1

## 2014-12-08 MED ORDER — ACETAMINOPHEN 325 MG PO TABS: 650.0000 mg | ORAL_TABLET | ORAL | Status: DC | PRN

## 2014-12-08 MED ORDER — AMLODIPINE BESYLATE 10 MG PO TABS
10.0000 mg | ORAL_TABLET | Freq: Every day | ORAL | Status: DC
  Administered 2014-12-08: 10 mg via ORAL
  Filled 2014-12-08: qty 1

## 2014-12-08 MED ORDER — BUPIVACAINE HCL (PF) 0.25 % IJ SOLN
INTRAMUSCULAR | Status: AC
  Filled 2014-12-08: qty 60

## 2014-12-08 MED ORDER — POTASSIUM CHLORIDE CRYS ER 20 MEQ PO TBCR
20.0000 meq | EXTENDED_RELEASE_TABLET | Freq: Every day | ORAL | Status: DC
  Administered 2014-12-08: 20 meq via ORAL
  Filled 2014-12-08: qty 1

## 2014-12-08 MED ORDER — ONDANSETRON HCL 4 MG/2ML IJ SOLN: 4.0000 mg | Freq: Four times a day (QID) | INTRAMUSCULAR | Status: DC | PRN

## 2014-12-08 MED ORDER — HYDROCHLOROTHIAZIDE 25 MG PO TABS
25.0000 mg | ORAL_TABLET | Freq: Every day | ORAL | Status: DC
  Administered 2014-12-08: 25 mg via ORAL
  Filled 2014-12-08: qty 1

## 2014-12-08 MED ORDER — CARVEDILOL 25 MG PO TABS
25.0000 mg | ORAL_TABLET | Freq: Two times a day (BID) | ORAL | Status: DC
  Administered 2014-12-08: 25 mg via ORAL
  Filled 2014-12-08 (×2): qty 1

## 2014-12-08 MED ORDER — SODIUM CHLORIDE 0.9 % IJ SOLN: 3.0000 mL | INTRAMUSCULAR | Status: DC | PRN

## 2014-12-08 MED ORDER — SODIUM CHLORIDE 0.9 % IJ SOLN: 3.0000 mL | Freq: Two times a day (BID) | INTRAMUSCULAR | Status: DC

## 2014-12-08 NOTE — Progress Notes (Signed)
Site area: RT IJ Site Prior to Removal:  Level 0 Pressure Applied For:89min Manual:    Patient Status During Pull:  stable Post Pull Site:  Level 0 Post Pull Instructions Given:  yes Post Pull Pulses Present:  Dressing Applied:  clear Bedrest begins @ 1219 Comments: by canderson

## 2014-12-08 NOTE — Discharge Summary (Signed)
Physician Discharge Summary  Patient ID: Xavier Bell MRN: [id number] DOB/AGE: Jun 11, 1941 73 y.o.  Admit date: 12/08/2014 Discharge date: 12/08/2014  Admission Diagnoses: Atrial Flutter  Discharge Diagnoses:  Active Problems:   Atrial flutter   Discharged Condition: stable  Hospital Course: The patient is a 73 y/o male, followed by Dr. Lovena Le, with a history of typical atrial flutter, on Xarelto for stroke prophylaxis. He [facility] to [facility] today for planned EP study/ atrial flutter ablation. The procedure was performed by Dr. Lovena Le. He underwent successful radiofrequency ablation of atrial flutter along the cavotricuspid isthmus with complete bidirectional isthmus block achieved. 3 RFs were delivered. No inducible arrhythmias following ablation. He was monitored post-procedure and had no complications. His cath sites remained stable. He had no difficulties with ambulation. He remained in SR on telemetry. He was last seen and examined by Dr. Lovena Le who determined he was stable for discharge home. He was instructed to resume Xarelto night of discharge. Follow-up has been arranged with Dr. Lovena Le  01/11/15.   Consults: None  Significant Diagnostic Studies: See CV procedure note  Treatments: See Hospital Course  Discharge Exam: Blood pressure 107/56, pulse 74, temperature 98 F (36.7 C), temperature source Oral, resp. rate 16, height 5\' 7"  (1.702 m), weight 160 lb (72.576 kg), SpO2 99 %.   Disposition: 01-Home or Self Care      Discharge Instructions    Diet - low sodium heart healthy    Complete by:  As directed      Increase activity slowly    Complete by:  As directed             Medication List    TAKE these medications        amLODipine 10 MG tablet  Commonly known as:  NORVASC  Take 1 tablet (10 mg total) by mouth daily.     atorvastatin 40 MG tablet  Commonly known as:  LIPITOR  Take 1 tablet (40 mg total) by mouth daily.     carvedilol 25 MG tablet    Commonly known as:  COREG  Take 1 tablet (25 mg total) by mouth 2 (two) times daily with a meal.     dorzolamide-timolol 22.3-6.8 MG/ML ophthalmic solution  Commonly known as:  COSOPT  Place 2 drops into both eyes 2 (two) times daily.     fenofibrate 145 MG tablet  Commonly known as:  TRICOR  Take 1 tablet (145 mg total) by mouth daily.     hydrochlorothiazide 25 MG tablet  Commonly known as:  HYDRODIURIL  Take 1 tablet (25 mg total) by mouth daily.     ibuprofen 200 MG tablet  Commonly known as:  ADVIL,MOTRIN  Take 200 mg by mouth at bedtime.     losartan 100 MG tablet  Commonly known as:  COZAAR  TAKE 1 TABLET DAILY     Magnesium 400 MG Caps  Take 1 tablet by mouth daily. Possible 50 mg.. Pt unsure.     multivitamin tablet  Take 1 tablet by mouth daily.     Omega 3 1200 MG Caps  Take 1,200 mg by mouth 2 (two) times daily.     ondansetron 4 MG/2ML Soln injection  Commonly known as:  ZOFRAN  Inject 2 mLs (4 mg total) into the vein every 6 (six) hours as needed for nausea.     potassium chloride SA 20 MEQ tablet  Commonly known as:  K-DUR,KLOR-CON  Take 1 tablet (20 mEq total) by mouth daily.  rivaroxaban 20 MG Tabs tablet  Commonly known as:  XARELTO  Take 20 mg by mouth daily.     Vitamin D 1000 UNITS capsule  Take 2,000 Units by mouth daily.     Zinc 50 MG Caps  Take 50 mg by mouth daily.       Follow-up Information    Follow up with Cristopher Peru, MD On 01/11/2015.   Specialty:  Cardiology   Why:  2:45 pm    Contact information:   [address] [address] [phone]      TIME SPENT ON DISCHARGE, INCLUDING PHYSICIAN TIME: >30 MINUTES  Signed: Lyda Jester 12/08/2014, 5:03 PM  EP Attending   Agree with above assessment and plan. [place] for discharge.   Mikle Bosworth.D.

## 2014-12-08 NOTE — H&P (View-Only) (Signed)
HPI [name] is referred today by Dr. Acie Fredrickson for evaluation of recurrent atrial flutter. He is a 73 year old man with known coronary disease, status post coronary artery bypass grafting, who had atrial flutter initially in 2014. He has been anticoagulated. He was found to be a recurrent atrial flutter several weeks ago. He is referred today to consider catheter ablation. He notes some fatigue and weakness but no edema. He does not feel palpitations.  Allergies  Allergen Reactions  . Niacin And Related Other (See Comments)    Stomach Pain     Current Outpatient Prescriptions  Medication Sig Dispense Refill  . amLODipine (NORVASC) 10 MG tablet Take 1 tablet (10 mg total) by mouth daily. 90 tablet 3  . atorvastatin (LIPITOR) 40 MG tablet Take 1 tablet (40 mg total) by mouth daily. 90 tablet 3  . carvedilol (COREG) 25 MG tablet Take 1 tablet (25 mg total) by mouth 2 (two) times daily with a meal. 180 tablet 3  . Cholecalciferol (VITAMIN D) 1000 UNITS capsule Take 2,000 Units by mouth daily.     . dorzolamide-timolol (COSOPT) 22.3-6.8 MG/ML ophthalmic solution Place 2 drops into both eyes 2 (two) times daily.    . fenofibrate (TRICOR) 145 MG tablet Take 1 tablet (145 mg total) by mouth daily. 90 tablet 3  . fish oil-omega-3 fatty acids 1000 MG capsule Take by mouth daily. Taking 1400mg     . hydrochlorothiazide (HYDRODIURIL) 25 MG tablet Take 1 tablet (25 mg total) by mouth daily. 90 tablet 3  . ibuprofen (ADVIL,MOTRIN) 200 MG tablet Take 200 mg by mouth every 6 (six) hours as needed.    [name] losartan (COZAAR) 100 MG tablet TAKE 1 TABLET DAILY 90 tablet 3  . Magnesium 400 MG CAPS Take 1 tablet by mouth daily. Possible 50 mg.. Pt unsure.    . Multiple Vitamin (MULTIVITAMIN) tablet Take 1 tablet by mouth daily.      . potassium chloride SA (K-DUR,KLOR-CON) 20 MEQ tablet Take 1 tablet (20 mEq total) by mouth daily. 90 tablet 3  . Probiotic Product (PROBIOTIC PO) Take 1 tablet by mouth  daily.    . rivaroxaban (XARELTO) 20 MG TABS tablet Take 20 mg by mouth daily.    . Zinc 50 MG CAPS Take 50 mg by mouth daily.      No current facility-administered medications for this visit.     Past Medical History  Diagnosis Date  . Coronary artery disease     s/p remote MI in 1994 with multiple PCI and subsequent CABG x 5 in 1996  . Hypertension   . Dyslipidemia   . MI (myocardial infarction) 1995  . Retinal detachment   . Pseudoaphakia     both eyes  . Atrial flutter     s/p cardioversion 02/08/12  . Chronic anticoagulation   . Normal nuclear stress test 2006    ROS:   All systems reviewed and negative except as noted in the HPI.   Past Surgical History  Procedure Laterality Date  . Coronary artery bypass graft  1996    LIMA to LAD, SVG to RV marginal & PD, SVG  to DX and SVG to Ramus  . Cardiac catheterization  1996  . Tonsillectomy and adenoidectomy    . Cardioversion  02/08/2012    Procedure: CARDIOVERSION;  Surgeon: Darden Amber., MD;  Location: [facility] OR;  Service: Cardiovascular;  Laterality: N/A;     Family History  Problem Relation Age of Onset  .  Coronary artery disease    . Heart disease Mother   . Heart disease Father      History   Social History  . Marital Status: Married    Spouse Name: N/A    Number of Children: N/A  . Years of Education: N/A   Occupational History  . Not on file.   Social History Main Topics  . Smoking status: Never Smoker   . Smokeless tobacco: Not on file  . Alcohol Use: No  . Drug Use: No  . Sexual Activity: Not on file   Other Topics Concern  . Not on file   Social History Narrative     There were no vitals taken for this visit.  Physical Exam:  Well appearing 73 yo man, NAD HEENT: Unremarkable Neck:  No JVD, no thyromegally Back:  No CVA tenderness Lungs:  Clear with no wheezes HEART:  IRegular rate rhythm, no murmurs, no rubs, no clicks Abd:  soft, positive bowel sounds, no organomegally, no  rebound, no guarding Ext:  2 plus pulses, no edema, no cyanosis, no clubbing Skin:  No rashes no nodules Neuro:  CN II through XII intact, motor grossly intact  EKG - atrial flutter with 4:1 AV conduction.  Assess/Plan:

## 2014-12-08 NOTE — Progress Notes (Signed)
Site area: RFV x3 Site Prior to Removal:  Level 0 Pressure Applied For: 55min Manual:    Patient Status During Pull:  stable Post Pull Site:  Level 0Post Pull Instructions Given: yes  Post Pull Pulses Present: palpable Dressing Applied:  clear Bedrest begins @ 1035 Comments:removed by Suella Broad

## 2014-12-08 NOTE — Interval H&P Note (Signed)
History and Physical Interval Note:  12/08/2014 8:46 AM  Xavier Bell  has presented today for surgery, with the diagnosis of aflutter  The various methods of treatment have been discussed with the patient and family. After consideration of risks, benefits and other options for treatment, the patient has consented to  Procedure(s): ATRIAL FLUTTER ABLATION (N/A) as a surgical intervention .  The patient's history has been reviewed, patient examined, no change in status, stable for surgery.  I have reviewed the patient's chart and labs.  Questions were answered to the patient's satisfaction.     Mikle Bosworth.D.

## 2014-12-08 NOTE — CV Procedure (Signed)
SURGEON: Cristopher Peru, MD   PREPROCEDURE DIAGNOSIS: Typical Atrial flutter.   POSTPROCEDURE DIAGNOSIS: Typical Atrial flutter.   PROCEDURES:  1. Comprehensive electrophysiology study.  2. Coronary sinus pacing and recording.  3. Mapping of atrial flutter.  5. Ablation of atrial flutter.  6. Arrhythmia induction with pacing.   INTRODUCTION: Xavier Bell is a 73 y.o. male with symptomatic typical atrial flutter. He presents today for EP study and radiofrequency ablation.   DESCRIPTION OF PROCEDURE: Informed written consent was obtained, and the patient was brought to the Electrophysiology Lab in the fasting state. He was sedated with IV Versed and Fentanyl. The patient's right groin and neck was prepped and draped in the usual sterile fashion by the EP lab staff. Using a modified Seldinger technique, one 6, and two 8-Ahmadou hemostasis sheaths were placed in the right common femoral vein. A 7-Job hemostasis sheath was placed in the right internal jugular vein. A 6-Kristion hexapolar catheter was placed into the coronary sinus via the right internal jugular vein. A 6-Cloyce quadripolar Josephson catheter was introduced through the right common femoral vein and advanced into the right ventricle for recording and pacing, but this catheter was then pulled back to the His bundle location.   Initial Measurements: The patient presented to the electrophysiology lab in atrial flutter. The surface electrocardiogram was consistent with typical atrial flutter.  The atrial flutter cycle length was 240 milliseconds.  The coronary sinus catheter activation revealed proximal to distal activation and was therefore suggestive of right atrial flutter.  The patient's QRS duration was 135 milliseconds with a QT interval of 417 milliseconds and an HV interval of 55 milliseconds.   Entrainment and Mapping: Entrainment was performed from the left atrium, which revealed a long postpacing interval.  A Biosense  Webster 8-mm ablation catheter was introduced through the right common femoral vein and advanced into the right atrium.  The catheter was positioned along the cavotricuspid isthmus.  Entrainment mapping was performed from the cavotricuspid isthmus.  The postpacing interval was equal to the tachycardia cycle length when pacing in this location during entrainment.  The patient was therefore felt to have isthmus-dependent right atrial flutter.  Mapping was performed along the atrial side of the cavotricuspid isthmus.  This demonstrated a moderate-sized isthmus.     Ablation:  The ablation catheter was therefore positioned along the cavotricuspid isthmus and a series of radiofrequency applications were delivered with a target temperature of 60 degrees of 50 watts for 120 seconds each.  The tachycardia slowed and then terminated during radiofrequency application.  Additional mapping of the atrial signal was performed with  additional ablation performed.  Following bonus radiofrequency applications, complete bidirectional isthmus block was achieved as evident by differential atrial pacing from the low lateral right atrium.  The patient was observed for 30 minutes without return of conduction through the cavotricuspid isthmus.    Measurements Post ablation: Following ablation, the stimulus to earliest atrial activation recorded across the isthmus line measured 190 milliseconds.  The AH interval measured 200 milliseconds with an HV interval of 61 milliseconds.  Atrial pacing was performed, which revealed decremental AV conduction with no evidence of PR greater than RR.  The AV Wenckebach cycle length was 640 milliseconds.  Atrial pacing was continued down to a cycle length of 300 milliseconds with no arrhythmias induced.  Ventricular pacing was performed, which revealed VA dissociation at 600 ms. No arrhythmias were induced. The procedure was therefore considered completed.  All catheters were removed and the sheaths  were aspirated and flushed.  The sheaths were removed and hemostasis was assured.  There were no early apparent complications.    CONCLUSIONS:  1. Isthmus-dependent right atrial flutter upon presentation.  2. Successful radiofrequency ablation of atrial flutter along the cavotricuspid isthmus with complete bidirectional isthmus block achieved. 3 RFs were delivered 3. No inducible arrhythmias following ablation.  4. No early apparent complications.   Cristopher Peru, MD  9:51 AM 12/08/2014

## 2014-12-08 NOTE — Progress Notes (Signed)
DC IV and tele per MD orders; DC instructions reviewed with patient and spouse at bedside; no further questions from patient or family.  Rowe Pavy, RN

## 2014-12-09 ENCOUNTER — Encounter (HOSPITAL_COMMUNITY): Payer: Self-pay | Admitting: *Deleted

## 2014-12-09 ENCOUNTER — Telehealth: Payer: Self-pay | Admitting: Internal Medicine

## 2014-12-09 NOTE — Telephone Encounter (Signed)
Pt called regarding changing his appointment from 02/04/14 to 01/21/15. Pt has appointment on 01/21/15 at 3:45 Pm with D. Lovena Le. Pt also would like to have Xarelto 20 mg sample to have enough until he comes for an appointment with Dr. Lovena Le on January 29 th. Pt is aware that we do not have 20 mg samples today. Pt is aware to call next week, we may need to have samples then. Pt verbalized understanding.

## 2014-12-09 NOTE — Telephone Encounter (Signed)
New Message  Pt had cath 12/16. [facility] appt was made 1/19. Pt is out of town, resch made for 2/12. Pt wanted to be sure this was okay with Rn/Dr. Lovena Le.  Also Pt wanted to let Rn aware that he has 36 days (including today) of xarelto left, needs to know how to proceed. Please call back and discuss.

## 2014-12-14 ENCOUNTER — Telehealth: Payer: Self-pay | Admitting: Internal Medicine

## 2014-12-14 NOTE — Telephone Encounter (Signed)
Samples of Xarelto out front

## 2014-12-14 NOTE — Telephone Encounter (Signed)
New Message  Pt requesting to speak with Rn about his medications. Please call back and discuss.

## 2014-12-22 ENCOUNTER — Telehealth: Payer: Self-pay | Admitting: Cardiology

## 2014-12-22 NOTE — Telephone Encounter (Signed)
Spoke w/ [facility] pharmacist who needed to verify an order placed 12/08/14 for Ondansetron IV form. It appears Brittainy [employer] was prescriber and this was an order placed during hospitalization.  Told pharmacist that [facility] probably received an intended inpatient order in error, but that I would clarify whether or not pt needed PO form for home and return call.   Will route to [employer] for clarification.  Ref# [id number]

## 2014-12-23 ENCOUNTER — Telehealth: Payer: Self-pay | Admitting: Internal Medicine

## 2014-12-23 NOTE — Telephone Encounter (Signed)
This was an error. No need for IV ondansertron. Thank you.

## 2014-12-23 NOTE — Telephone Encounter (Signed)
Called patient, spoke to him about the issue. This is related to the call I handled yesterday w/ [facility] concerning them sending zofran IV that was ordered during a hospitalization. They will not be sending the medication out, and I let pt know. He was not aware of a need for the medication, hence the reason for his call.

## 2014-12-23 NOTE — Telephone Encounter (Signed)
New message      Talk to a nurse regarding a medication that was [facility].  He does not know the name of the drug but he got a [facility].  He was not aware of a presc being called in to the pharmacy.  He had a procedure recently.

## 2014-12-27 NOTE — Telephone Encounter (Signed)
Called [facility] to ensure Rx was cancelled.

## 2015-01-11 ENCOUNTER — Encounter: Payer: PRIVATE HEALTH INSURANCE | Admitting: Internal Medicine

## 2015-01-21 ENCOUNTER — Encounter: Payer: Self-pay | Admitting: Internal Medicine

## 2015-01-21 ENCOUNTER — Ambulatory Visit (INDEPENDENT_AMBULATORY_CARE_PROVIDER_SITE_OTHER): Payer: PRIVATE HEALTH INSURANCE | Admitting: Internal Medicine

## 2015-01-21 VITALS — BP 150/78 | HR 62 | Ht 67.0 in | Wt 162.0 lb

## 2015-01-21 DIAGNOSIS — I2510 Atherosclerotic heart disease of native coronary artery without angina pectoris: Secondary | ICD-10-CM

## 2015-01-21 DIAGNOSIS — I483 Typical atrial flutter: Secondary | ICD-10-CM

## 2015-01-21 MED ORDER — RIVAROXABAN 20 MG PO TABS: 20.0000 mg | ORAL_TABLET | Freq: Every day | ORAL | Status: DC

## 2015-01-21 NOTE — Assessment & Plan Note (Signed)
He is doing well s/p cathter ablation. Because he has only had documented atrial flutter, and is maintaining NSR, he can stop Xarelto. Will see him back on an as needed basis.

## 2015-01-21 NOTE — Progress Notes (Signed)
HPI Xavier Bell returns today for followup. He is a pleasant 74 yo man with HTN who developed atrial flutter and underwent cathter ablation over a month ago. In the interim, he has felt well. He denies chest pain or sob and has had no palpitations. No syncope. No edema. Allergies  Allergen Reactions  . Niacin And Related Other (See Comments)    Stomach Pain and flushing      Current Outpatient Prescriptions  Medication Sig Dispense Refill  . amLODipine (NORVASC) 10 MG tablet Take 1 tablet (10 mg total) by mouth daily. 90 tablet 3  . atorvastatin (LIPITOR) 40 MG tablet Take 1 tablet (40 mg total) by mouth daily. 90 tablet 3  . carvedilol (COREG) 25 MG tablet Take 1 tablet (25 mg total) by mouth 2 (two) times daily with a meal. 180 tablet 3  . Cholecalciferol (VITAMIN D) 1000 UNITS capsule Take 2,000 Units by mouth daily.     . dorzolamide-timolol (COSOPT) 22.3-6.8 MG/ML ophthalmic solution Place 2 drops into both eyes 2 (two) times daily.    . fenofibrate (TRICOR) 145 MG tablet Take 1 tablet (145 mg total) by mouth daily. 90 tablet 3  . hydrochlorothiazide (HYDRODIURIL) 25 MG tablet Take 1 tablet (25 mg total) by mouth daily. 90 tablet 3  . losartan (COZAAR) 100 MG tablet TAKE 1 TABLET DAILY (Patient taking differently: TAKE 1 TABLET BY MOUTH DAILY) 90 tablet 3  . MAGNESIUM PO Take 250 mg by mouth daily.     . Multiple Vitamin (MULTIVITAMIN) tablet Take 1 tablet by mouth daily.      . Omega 3 1200 MG CAPS Take 1,200 mg by mouth 2 (two) times daily.    . potassium chloride SA (K-DUR,KLOR-CON) 20 MEQ tablet Take 1 tablet (20 mEq total) by mouth daily. 90 tablet 3  . rivaroxaban (XARELTO) 20 MG TABS tablet Take 20 mg by mouth daily.    . Zinc 50 MG CAPS Take 50 mg by mouth daily.      No current facility-administered medications for this visit.     Past Medical History  Diagnosis Date  . Coronary artery disease     s/p remote MI in 1994 with multiple PCI and subsequent CABG  x 5 in 1996  . Hypertension   . Dyslipidemia   . MI (myocardial infarction) 1995  . Retinal detachment   . Pseudoaphakia     both eyes  . Atrial flutter     s/p cardioversion 02/08/12; ablation by Dr Xavier Bell [phone]  . Chronic anticoagulation   . Normal nuclear stress test 2006    ROS:   All systems reviewed and negative except as noted in the HPI.   Past Surgical History  Procedure Laterality Date  . Coronary artery bypass graft  1996    LIMA to LAD, SVG to RV marginal & PD, SVG  to DX and SVG to Ramus  . Cardiac catheterization  1996  . Tonsillectomy and adenoidectomy    . Cardioversion  02/08/2012    Procedure: CARDIOVERSION;  Surgeon: Darden Amber., MD;  Location: [facility] OR;  Service: Cardiovascular;  Laterality: N/A;  . Atrial flutter ablation N/A 12/08/2014    CTI by Dr Xavier Bell     Family History  Problem Relation Age of Onset  . Coronary artery disease    . Heart disease Mother   . Heart disease Father      History   Social History  . Marital Status: Married  Spouse Name: N/A    Number of Children: N/A  . Years of Education: N/A   Occupational History  . Not on file.   Social History Main Topics  . Smoking status: Never Smoker   . Smokeless tobacco: Not on file  . Alcohol Use: No  . Drug Use: No  . Sexual Activity: Not on file   Other Topics Concern  . Not on file   Social History Narrative     BP 150/78 mmHg  Pulse 62  Ht 5\' 7"  (1.702 m)  Wt 162 lb (73.483 kg)  BMI 25.37 kg/m2  Physical Exam:  Well appearing 74 yo man, NAD HEENT: Unremarkable Neck:  No JVD, no thyromegally Back:  No CVA tenderness Lungs:  Clear with no wheezes, rales or rhonchi. HEART:  Regular rate rhythm, no murmurs, no rubs, no clicks Abd:  soft, positive bowel sounds, no organomegally, no rebound, no guarding Ext:  2 plus pulses, no edema, no cyanosis, no clubbing Skin:  No rashes no nodules Neuro:  CN II through XII intact, motor grossly intact  EKG - nsr  with LVH   Assess/Plan:

## 2015-01-21 NOTE — Assessment & Plan Note (Signed)
He denies anginal symptoms and ECG does not demonstrate any ischemia. He will follow with Dr. Acie Fredrickson.

## 2015-01-21 NOTE — Patient Instructions (Signed)
Dr. Lovena Le will follow up with you as needed.  Your physician has recommended you make the following change in your medication:  1) Stop Xarelto.

## 2015-01-24 ENCOUNTER — Other Ambulatory Visit: Payer: Self-pay | Admitting: *Deleted

## 2015-01-24 MED ORDER — ATORVASTATIN CALCIUM 40 MG PO TABS: 40.0000 mg | ORAL_TABLET | Freq: Every day | ORAL | Status: DC

## 2015-02-04 ENCOUNTER — Encounter: Payer: PRIVATE HEALTH INSURANCE | Admitting: Internal Medicine

## 2015-02-15 ENCOUNTER — Other Ambulatory Visit: Payer: Self-pay

## 2015-02-15 MED ORDER — FENOFIBRATE 145 MG PO TABS: 145.0000 mg | ORAL_TABLET | Freq: Every day | ORAL | Status: DC

## 2015-03-21 ENCOUNTER — Other Ambulatory Visit: Payer: Self-pay

## 2015-03-21 MED ORDER — AMLODIPINE BESYLATE 10 MG PO TABS: 10.0000 mg | ORAL_TABLET | Freq: Every day | ORAL | Status: DC

## 2015-03-21 MED ORDER — HYDROCHLOROTHIAZIDE 25 MG PO TABS: 25.0000 mg | ORAL_TABLET | Freq: Every day | ORAL | Status: DC

## 2015-04-04 ENCOUNTER — Other Ambulatory Visit: Payer: Self-pay | Admitting: *Deleted

## 2015-04-04 MED ORDER — POTASSIUM CHLORIDE CRYS ER 20 MEQ PO TBCR: 20.0000 meq | EXTENDED_RELEASE_TABLET | Freq: Every day | ORAL | Status: DC

## 2015-05-09 ENCOUNTER — Encounter: Payer: Self-pay | Admitting: Cardiovascular Disease

## 2015-05-09 ENCOUNTER — Ambulatory Visit (INDEPENDENT_AMBULATORY_CARE_PROVIDER_SITE_OTHER): Payer: PRIVATE HEALTH INSURANCE | Admitting: Cardiovascular Disease

## 2015-05-09 VITALS — BP 140/68 | HR 62 | Ht 67.0 in | Wt 159.2 lb

## 2015-05-09 DIAGNOSIS — I739 Peripheral vascular disease, unspecified: Principal | ICD-10-CM

## 2015-05-09 DIAGNOSIS — I480 Paroxysmal atrial fibrillation: Secondary | ICD-10-CM | POA: Diagnosis not present

## 2015-05-09 DIAGNOSIS — I779 Disorder of arteries and arterioles, unspecified: Secondary | ICD-10-CM | POA: Diagnosis not present

## 2015-05-09 DIAGNOSIS — I2510 Atherosclerotic heart disease of native coronary artery without angina pectoris: Secondary | ICD-10-CM | POA: Diagnosis not present

## 2015-05-09 NOTE — Patient Instructions (Signed)

## 2015-05-09 NOTE — Progress Notes (Signed)
Cardiology Office Note   Date:  05/09/2015   ID:  Xavier Bell, Xavier Bell Feb 20, 1941, MRN [id number]  PCP:  No PCP Per Patient  Cardiologist:   [name], MD   Chief Complaint  Patient presents with  . Follow-up    CAD, HTN   1. Coronary artery disease-status post CABG 2. Atrial flutter- s/p cardioversion 02/08/12 3. Hypertension 4. Right carotid bruit  History of Present Illness:  Has a history of atrial flutter this past spring. He status post cardioversion. [name] done very well. He continues to take Xarelto. [name] not been exercising nearly as much as he needs to. [name] also been careless with his diet and has picked up a few pounds.  March 06, 2013:  He is doing well. No angina. He is continuing to have foot problems - especially of the left foot. He has intense pain in his heel when he walks.  Sept. 12, 2014:  He has done well. Still not exercising as much. Takes motrin at night. He has left plantar faciatis.   March 19, 2014:  [name] is doing ok No CP or dyspnea. No palpitations. He has gone back into atrial flutter. He cannot tell that his HR is irreg.   He has a hx of a-flutter in the past ( 2014) and was treated with Xarelto and was cardioverted.   May 09, 2015 Xavier Bell is a 74 y.o. male who presents for follow up of his atrial flutter , HTN, CAD No CP , not exercising much .     Past Medical History  Diagnosis Date  . Coronary artery disease     s/p remote MI in 1994 with multiple PCI and subsequent CABG x 5 in 1996  . Hypertension   . Dyslipidemia   . MI (myocardial infarction) 1995  . Retinal detachment   . Pseudoaphakia     both eyes  . Atrial flutter     s/p cardioversion 02/08/12; ablation by [name] [phone]  . Chronic anticoagulation   . Normal nuclear stress test 2006    Past Surgical History  Procedure Laterality Date  . Coronary artery bypass graft  1996    LIMA to LAD, SVG to RV marginal & PD, SVG  to DX and  SVG to Ramus  . Cardiac catheterization  1996  . Tonsillectomy and adenoidectomy    . Cardioversion  02/08/2012    Procedure: CARDIOVERSION;  Surgeon: Darden Amber., MD;  Location: [facility] OR;  Service: Cardiovascular;  Laterality: N/A;  . Atrial flutter ablation N/A 12/08/2014    CTI by [name]     Current Outpatient Prescriptions  Medication Sig Dispense Refill  . amLODipine (NORVASC) 10 MG tablet Take 1 tablet (10 mg total) by mouth daily. 90 tablet 3  . aspirin 81 MG tablet Take 81 mg by mouth daily.    [name] atorvastatin (LIPITOR) 40 MG tablet Take 1 tablet (40 mg total) by mouth daily. 90 tablet 1  . carvedilol (COREG) 25 MG tablet Take 1 tablet (25 mg total) by mouth 2 (two) times daily with a meal. 180 tablet 3  . Cholecalciferol (VITAMIN D) 1000 UNITS capsule Take 2,000 Units by mouth daily.     . dorzolamide-timolol (COSOPT) 22.3-6.8 MG/ML ophthalmic solution Place 2 drops into both eyes 2 (two) times daily.    . fenofibrate (TRICOR) 145 MG tablet Take 1 tablet (145 mg total) by mouth daily. 90 tablet 1  . hydrochlorothiazide (HYDRODIURIL) 25 MG tablet Take  1 tablet (25 mg total) by mouth daily. 90 tablet 3  . losartan (COZAAR) 100 MG tablet TAKE 1 TABLET DAILY (Patient taking differently: TAKE 1 TABLET BY MOUTH DAILY) 90 tablet 3  . MAGNESIUM PO Take 250 mg by mouth daily.     . Multiple Vitamin (MULTIVITAMIN) tablet Take 1 tablet by mouth daily.      . Omega 3 1200 MG CAPS Take 1,200 mg by mouth 2 (two) times daily.    . potassium chloride SA (K-DUR,KLOR-CON) 20 MEQ tablet Take 1 tablet (20 mEq total) by mouth daily. 90 tablet 3  . Zinc 50 MG CAPS Take 50 mg by mouth daily.      No current facility-administered medications for this visit.    Allergies:   Niacin and related    Social History:  The patient  reports that he has never smoked. He does not have any smokeless tobacco history on file. He reports that he does not drink alcohol or use illicit drugs.   Family History:   The patient's family history includes Coronary artery disease in an other family member; Heart disease in his father and mother.    ROS:  Please see the history of present illness.    Review of Systems: Constitutional:  denies fever, chills, diaphoresis, appetite change and fatigue.  HEENT: denies photophobia, eye pain, redness, hearing loss, ear pain, congestion, sore throat, rhinorrhea, sneezing, neck pain, neck stiffness and tinnitus.  Respiratory: denies SOB, DOE, cough, chest tightness, and wheezing.  Cardiovascular: denies chest pain, palpitations and leg swelling.  Gastrointestinal: denies nausea, vomiting, abdominal pain, diarrhea, constipation, blood in stool.  Genitourinary: denies dysuria, urgency, frequency, hematuria, flank pain and difficulty urinating.  Musculoskeletal: denies  myalgias, back pain, joint swelling, arthralgias and gait problem.   Skin: denies pallor, rash and wound.  Neurological: denies dizziness, seizures, syncope, weakness, light-headedness, numbness and headaches.   Hematological: denies adenopathy, easy bruising, personal or family bleeding history.  Psychiatric/ Behavioral: denies suicidal ideation, mood changes, confusion, nervousness, sleep disturbance and agitation.       All other systems are reviewed and negative.    PHYSICAL EXAM: VS:  BP 140/68 mmHg  Pulse 62  Ht 5\' 7"  (1.702 m)  Wt 72.213 kg (159 lb 3.2 oz)  BMI 24.93 kg/m2 , BMI Body mass index is 24.93 kg/(m^2). GEN: Well nourished, well developed, in no acute distress HEENT: normal Neck: no JVD, soft right carotid bruit, , no   masses Cardiac: RRR; no murmurs, rubs, or gallops,no edema  Respiratory:  clear to auscultation bilaterally, normal work of breathing GI: soft, nontender, nondistended, + BS MS: no deformity or atrophy Skin: warm and dry, no rash Neuro:  Strength and sensation are intact Psych: normal   EKG:  EKG is not ordered today.    Recent Labs: 11/01/2014:  ALT 22 12/06/2014: BUN 29*; Creatinine 1.0; Hemoglobin 12.8*; Platelets 268.0; Potassium 3.9; Sodium 137    Lipid Panel    Component Value Date/Time   CHOL 86 11/01/2014 0748   TRIG 67.0 11/01/2014 0748   HDL 26.00* 11/01/2014 0748   CHOLHDL 3 11/01/2014 0748   VLDL 13.4 11/01/2014 0748   LDLCALC 47 11/01/2014 0748      Wt Readings from Last 3 Encounters:  05/09/15 72.213 kg (159 lb 3.2 oz)  01/21/15 73.483 kg (162 lb)  12/08/14 72.576 kg (160 lb)      Other studies Reviewed: Additional studies/ records that were reviewed today include: . Review of the above records  demonstrates:    ASSESSMENT AND PLAN:  1. Coronary artery disease-status post CABG - no angina   2. Atrial flutter- s/p cardioversion 02/08/12 - has maintaned NSR  3. Hypertension - BP is well controlled.   4. Right carotid bruit-    Current medicines are reviewed at length with the patient today.  The patient does not have concerns regarding medicines.  The following changes have been made:  no change  Labs/ tests ordered today include:  No orders of the defined types were placed in this encounter.     Disposition:   FU with me in 6 months .      Sofie Schendel, [name], MD  05/09/2015 8:32 AM    [facility] [place], [place], [place]  [address] Phone: [phone]; Fax: [phone]   [facility]  [address] [place], [place]  [address] [phone]    Fax [phone]

## 2015-06-24 ENCOUNTER — Other Ambulatory Visit: Payer: Self-pay | Admitting: Cardiovascular Disease

## 2015-07-16 ENCOUNTER — Other Ambulatory Visit: Payer: Self-pay | Admitting: Cardiovascular Disease

## 2015-07-19 ENCOUNTER — Other Ambulatory Visit: Payer: Self-pay

## 2015-07-19 MED ORDER — ATORVASTATIN CALCIUM 40 MG PO TABS: 40.0000 mg | ORAL_TABLET | Freq: Every day | ORAL | Status: DC

## 2015-08-16 ENCOUNTER — Other Ambulatory Visit: Payer: Self-pay

## 2015-08-16 MED ORDER — FENOFIBRATE 145 MG PO TABS: 145.0000 mg | ORAL_TABLET | Freq: Every day | ORAL | Status: DC

## 2015-08-16 NOTE — Telephone Encounter (Signed)
Thayer Headings, MD at 05/09/2015 8:32 AM  fenofibrate (TRICOR) 145 MG tablet Take 1 tablet (145 mg total) by mouth daily         Current medicines are reviewed at length with the patient today. The patient does not have concerns regarding medicines.  The following changes have been made: no change

## 2015-08-16 NOTE — Telephone Encounter (Signed)
Good question- ok to take together!

## 2015-10-04 ENCOUNTER — Telehealth: Payer: Self-pay

## 2015-10-04 MED ORDER — LOSARTAN POTASSIUM 100 MG PO TABS: ORAL_TABLET | ORAL | Status: DC

## 2015-10-04 NOTE — Telephone Encounter (Signed)
Pt called said he needs a new Rx sent in for Losartan 100 mg to [facility]. I did reorder it for 90 days with 3 refills.

## 2015-11-07 ENCOUNTER — Other Ambulatory Visit (INDEPENDENT_AMBULATORY_CARE_PROVIDER_SITE_OTHER): Payer: PRIVATE HEALTH INSURANCE | Admitting: *Deleted

## 2015-11-07 ENCOUNTER — Ambulatory Visit (INDEPENDENT_AMBULATORY_CARE_PROVIDER_SITE_OTHER): Payer: PRIVATE HEALTH INSURANCE | Admitting: Cardiovascular Disease

## 2015-11-07 ENCOUNTER — Encounter: Payer: Self-pay | Admitting: Cardiovascular Disease

## 2015-11-07 VITALS — BP 120/70 | HR 57 | Ht 67.0 in | Wt 159.4 lb

## 2015-11-07 DIAGNOSIS — E785 Hyperlipidemia, unspecified: Secondary | ICD-10-CM | POA: Diagnosis not present

## 2015-11-07 DIAGNOSIS — I2510 Atherosclerotic heart disease of native coronary artery without angina pectoris: Secondary | ICD-10-CM

## 2015-11-07 DIAGNOSIS — I739 Peripheral vascular disease, unspecified: Secondary | ICD-10-CM

## 2015-11-07 DIAGNOSIS — I779 Disorder of arteries and arterioles, unspecified: Secondary | ICD-10-CM | POA: Diagnosis not present

## 2015-11-07 DIAGNOSIS — I480 Paroxysmal atrial fibrillation: Secondary | ICD-10-CM | POA: Diagnosis not present

## 2015-11-07 LAB — HEPATIC FUNCTION PANEL
ALT: 25 U/L (ref 9–46)
AST: 28 U/L (ref 10–35)
Albumin: 4.5 g/dL (ref 3.6–5.1)
Alkaline Phosphatase: 73 U/L (ref 40–115)
Bilirubin, Direct: 0.2 mg/dL (ref <=0.2)
Indirect Bilirubin: 0.4 mg/dL (ref 0.2–1.2)
TOTAL PROTEIN: 7.1 g/dL (ref 6.1–8.1)
Total Bilirubin: 0.6 mg/dL (ref 0.2–1.2)

## 2015-11-07 LAB — BASIC METABOLIC PANEL
BUN: 22 mg/dL (ref 7–25)
CHLORIDE: 108 mmol/L (ref 98–110)
CO2: 26 mmol/L (ref 20–31)
Calcium: 9.8 mg/dL (ref 8.6–10.3)
Creat: 1.3 mg/dL — ABNORMAL HIGH (ref 0.70–1.18)
Glucose, Bld: 104 mg/dL — ABNORMAL HIGH (ref 65–99)
POTASSIUM: 4.2 mmol/L (ref 3.5–5.3)
Sodium: 146 mmol/L (ref 135–146)

## 2015-11-07 LAB — LIPID PANEL
CHOL/HDL RATIO: 2.9 ratio (ref <=5.0)
Cholesterol: 106 mg/dL — ABNORMAL LOW (ref 125–200)
HDL: 36 mg/dL — AB (ref >=40)
LDL CALC: 52 mg/dL (ref <130)
TRIGLYCERIDES: 90 mg/dL (ref <150)
VLDL: 18 mg/dL (ref <30)

## 2015-11-07 NOTE — Patient Instructions (Signed)
Medication Instructions:  Your physician recommends that you continue on your current medications as directed. Please refer to the Current Medication list given to you today.   Labwork: Your physician recommends that you return for lab work in: 6 months on the day of or a few days before your office visit with Dr. [name].  You will need to FAST for this appointment - nothing to eat or drink after midnight the night before except water.    Testing/Procedures: Your physician has requested that you have a carotid duplex. This test is an ultrasound of the carotid arteries in your neck. It looks at blood flow through these arteries that supply the brain with blood. Allow one hour for this exam. There are no restrictions or special instructions.   Follow-Up: Your physician wants you to follow-up in: 6 months with Dr. [name].  You will receive a reminder letter in the mail two months in advance. If you don't receive a letter, please call our office to schedule the follow-up appointment.   If you need a refill on your cardiac medications before your next appointment, please call your pharmacy.   Thank you for choosing [facility]! Asaph Serena, RN [phone]    

## 2015-11-07 NOTE — Progress Notes (Signed)
Cardiology Office Note   Date:  11/07/2015   ID:  Xavier, Bell [birth year], MRN [id number]  PCP:  No PCP Per Patient  Cardiologist:   [name], MD   Chief Complaint  Patient presents with  . Coronary Artery Disease  . Hypertension   1. Coronary artery disease-status post CABG 2. Atrial flutter- s/p cardioversion 02/08/12 3. Hypertension 4. Right carotid bruit  History of Present Illness:  Has a history of atrial flutter this past spring. He status post cardioversion. [name] done very well. He continues to take Xarelto. [name] not been exercising nearly as much as he needs to. [name] also been careless with his diet and has picked up a few pounds.  March 06, 2013:  He is doing well. No angina. He is continuing to have foot problems - especially of the left foot. He has intense pain in his heel when he walks.  Sept. 12, 2014:  He has done well. Still not exercising as much. Takes motrin at night. He has left plantar faciatis.   March 19, 2014:  [name] is doing ok No CP or dyspnea. No palpitations. He has gone back into atrial flutter. He cannot tell that his HR is irreg.   He has a hx of a-flutter in the past ( 2014) and was treated with Xarelto and was cardioverted.   May 09, 2015 Xavier Bell is a 74 y.o. male who presents for follow up of his atrial flutter , HTN, CAD No CP , not exercising much .   Nov. 14, 2016: Doing well. BP is a bit high. Not much exercise ,  Sore feet.    Past Medical History  Diagnosis Date  . Coronary artery disease     s/p remote MI in 1994 with multiple PCI and subsequent CABG x 5 in 1996  . Hypertension   . Dyslipidemia   . MI (myocardial infarction) ([place]) 1995  . Retinal detachment   . Pseudoaphakia     both eyes  . Atrial flutter ([place])     s/p cardioversion 02/08/12; ablation by [name] [phone]  . Chronic anticoagulation   . Normal nuclear stress test 2006    Past Surgical History    Procedure Laterality Date  . Coronary artery bypass graft  1996    LIMA to LAD, SVG to RV marginal & PD, SVG  to DX and SVG to Ramus  . Cardiac catheterization  1996  . Tonsillectomy and adenoidectomy    . Cardioversion  02/08/2012    Procedure: CARDIOVERSION;  Surgeon: Darden Amber., MD;  Location: [facility] OR;  Service: Cardiovascular;  Laterality: N/A;  . Atrial flutter ablation N/A 12/08/2014    CTI by [name]     Current Outpatient Prescriptions  Medication Sig Dispense Refill  . amLODipine (NORVASC) 10 MG tablet Take 1 tablet (10 mg total) by mouth daily. 90 tablet 3  . aspirin 81 MG tablet Take 81 mg by mouth daily.    [name] atorvastatin (LIPITOR) 40 MG tablet Take 1 tablet (40 mg total) by mouth daily. 90 tablet 2  . carvedilol (COREG) 25 MG tablet Take 1 tablet by mouth 2 (two) times daily.    . Cholecalciferol (VITAMIN D) 1000 UNITS capsule Take 2,000 Units by mouth daily.     . dorzolamide-timolol (COSOPT) 22.3-6.8 MG/ML ophthalmic solution Place 2 drops into both eyes 2 (two) times daily.    . fenofibrate (TRICOR) 145 MG tablet Take 1 tablet (145 mg total)  by mouth daily. 90 tablet 2  . hydrochlorothiazide (HYDRODIURIL) 25 MG tablet Take 1 tablet (25 mg total) by mouth daily. 90 tablet 3  . losartan (COZAAR) 100 MG tablet TAKE 1 TABLET BY MOUTH DAILY 90 tablet 3  . MAGNESIUM PO Take 250 mg by mouth daily.     . Multiple Vitamin (MULTIVITAMIN) tablet Take 1 tablet by mouth daily.      . Omega 3 1200 MG CAPS Take 1,200 mg by mouth 2 (two) times daily.    . potassium chloride SA (K-DUR,KLOR-CON) 20 MEQ tablet Take 1 tablet (20 mEq total) by mouth daily. 90 tablet 3  . Zinc 50 MG CAPS Take 50 mg by mouth daily.      No current facility-administered medications for this visit.    Allergies:   Niacin and related    Social History:  The patient  reports that he has never smoked. He does not have any smokeless tobacco history on file. He reports that he does not drink alcohol or  use illicit drugs.   Family History:  The patient's family history includes Coronary artery disease in an other family member; Heart disease in his father and mother.    ROS:  Please see the history of present illness.    Review of Systems: Constitutional:  denies fever, chills, diaphoresis, appetite change and fatigue.  HEENT: denies photophobia, eye pain, redness, hearing loss, ear pain, congestion, sore throat, rhinorrhea, sneezing, neck pain, neck stiffness and tinnitus.  Respiratory: denies SOB, DOE, cough, chest tightness, and wheezing.  Cardiovascular: denies chest pain, palpitations and leg swelling.  Gastrointestinal: denies nausea, vomiting, abdominal pain, diarrhea, constipation, blood in stool.  Genitourinary: denies dysuria, urgency, frequency, hematuria, flank pain and difficulty urinating.  Musculoskeletal: denies  myalgias, back pain, joint swelling, arthralgias and gait problem.   Skin: denies pallor, rash and wound.  Neurological: denies dizziness, seizures, syncope, weakness, light-headedness, numbness and headaches.   Hematological: denies adenopathy, easy bruising, personal or family bleeding history.  Psychiatric/ Behavioral: denies suicidal ideation, mood changes, confusion, nervousness, sleep disturbance and agitation.       All other systems are reviewed and negative.    PHYSICAL EXAM: VS:  BP 150/70 mmHg  Pulse 57  Ht 5\' 7"  (1.702 m)  Wt 159 lb 6.4 oz (72.303 kg)  BMI 24.96 kg/m2 , BMI Body mass index is 24.96 kg/(m^2). GEN: Well nourished, well developed, in no acute distress HEENT: normal Neck: no JVD, soft right carotid bruit, , no   masses Cardiac: RRR;  Very soft systolic murmur, rubs, or gallops,no edema  Respiratory:  clear to auscultation bilaterally, normal work of breathing GI: soft, nontender, nondistended, + BS MS: no deformity or atrophy Skin: warm and dry, no rash Neuro:  Strength and sensation are intact Psych: normal   EKG:  EKG  : Sinus brady at 57,  1st degree AV block , RAD.    Recent Labs: 12/06/2014: BUN 29*; Creatinine, Ser 1.0; Hemoglobin 12.8*; Platelets 268.0; Potassium 3.9; Sodium 137    Lipid Panel    Component Value Date/Time   CHOL 86 11/01/2014 0748   TRIG 67.0 11/01/2014 0748   HDL 26.00* 11/01/2014 0748   CHOLHDL 3 11/01/2014 0748   VLDL 13.4 11/01/2014 0748   LDLCALC 47 11/01/2014 0748      Wt Readings from Last 3 Encounters:  11/07/15 159 lb 6.4 oz (72.303 kg)  05/09/15 159 lb 3.2 oz (72.213 kg)  01/21/15 162 lb (73.483 kg)  Other studies Reviewed: Additional studies/ records that were reviewed today include: . Review of the above records demonstrates:    ASSESSMENT AND PLAN:  1. Coronary artery disease-status post CABG - no angina   2. Atrial flutter- s/p cardioversion 02/08/12 - has maintaned NSR  3. Hypertension - BP is well controlled.   Encouraged him to exercise and avoid salt .   4. Right carotid bruit-   Will get a carotid duplex scan .  Has moderate carotid artery narrowing     Current medicines are reviewed at length with the patient today.  The patient does not have concerns regarding medicines.  The following changes have been made:  no change  Labs/ tests ordered today include:  No orders of the defined types were placed in this encounter.     Disposition:   FU with me in 6 months .   Will get fasting labs      Paizlee Kinder, [name], MD  11/07/2015 8:04 AM    [facility] [place], [place], [place]  [address] Phone: [phone]; Fax: [phone]   [facility]  [address] [place], [place]  [address] [phone]    Fax [phone]

## 2015-11-21 ENCOUNTER — Ambulatory Visit (HOSPITAL_COMMUNITY)
Admission: RE | Admit: 2015-11-21 | Discharge: 2015-11-21 | Disposition: A | Discharge status: Home / Self Care | Payer: No Typology Code available for payment source | Source: Ambulatory Visit | Attending: Cardiology | Admitting: Cardiology

## 2015-11-21 DIAGNOSIS — R938 Abnormal findings on diagnostic imaging of other specified body structures: Secondary | ICD-10-CM | POA: Insufficient documentation

## 2015-11-21 DIAGNOSIS — E785 Hyperlipidemia, unspecified: Secondary | ICD-10-CM | POA: Insufficient documentation

## 2015-11-21 DIAGNOSIS — I6523 Occlusion and stenosis of bilateral carotid arteries: Secondary | ICD-10-CM | POA: Diagnosis not present

## 2015-11-21 DIAGNOSIS — I779 Disorder of arteries and arterioles, unspecified: Secondary | ICD-10-CM | POA: Diagnosis not present

## 2015-11-21 DIAGNOSIS — I739 Peripheral vascular disease, unspecified: Secondary | ICD-10-CM

## 2015-11-21 DIAGNOSIS — I10 Essential (primary) hypertension: Secondary | ICD-10-CM | POA: Insufficient documentation

## 2015-11-24 ENCOUNTER — Telehealth: Payer: Self-pay

## 2015-11-24 DIAGNOSIS — I779 Disorder of arteries and arterioles, unspecified: Secondary | ICD-10-CM

## 2015-11-24 DIAGNOSIS — I739 Peripheral vascular disease, unspecified: Principal | ICD-10-CM

## 2015-11-24 NOTE — Telephone Encounter (Signed)
Informed patient of results and verbal understanding expressed.   Repeat carotids ordered to be scheduled in 2 years. Patient agrees with treatment plan. 

## 2015-11-24 NOTE — Telephone Encounter (Signed)
-----   Message from Thayer Headings, MD sent at 11/21/2015  4:53 PM EST ----- Moderate but stable carotid artery disease.  Follow up in 2 years

## 2016-02-18 ENCOUNTER — Other Ambulatory Visit: Payer: Self-pay | Admitting: Cardiovascular Disease

## 2016-02-25 ENCOUNTER — Other Ambulatory Visit: Payer: Self-pay | Admitting: Cardiovascular Disease

## 2016-03-21 ENCOUNTER — Other Ambulatory Visit: Payer: Self-pay | Admitting: *Deleted

## 2016-03-21 MED ORDER — POTASSIUM CHLORIDE CRYS ER 20 MEQ PO TBCR: 20.0000 meq | EXTENDED_RELEASE_TABLET | Freq: Every day | ORAL | Status: DC

## 2016-04-24 ENCOUNTER — Other Ambulatory Visit: Payer: Self-pay

## 2016-04-24 ENCOUNTER — Other Ambulatory Visit: Payer: Self-pay | Admitting: Cardiovascular Disease

## 2016-04-24 MED ORDER — ATORVASTATIN CALCIUM 40 MG PO TABS: 40.0000 mg | ORAL_TABLET | Freq: Every day | ORAL | Status: DC

## 2016-05-10 ENCOUNTER — Other Ambulatory Visit: Payer: Self-pay | Admitting: Cardiovascular Disease

## 2016-05-22 ENCOUNTER — Other Ambulatory Visit: Payer: Self-pay | Admitting: Cardiovascular Disease

## 2016-05-28 ENCOUNTER — Encounter: Payer: Self-pay | Admitting: Cardiovascular Disease

## 2016-05-28 ENCOUNTER — Other Ambulatory Visit (INDEPENDENT_AMBULATORY_CARE_PROVIDER_SITE_OTHER): Payer: PRIVATE HEALTH INSURANCE | Admitting: *Deleted

## 2016-05-28 ENCOUNTER — Ambulatory Visit (INDEPENDENT_AMBULATORY_CARE_PROVIDER_SITE_OTHER): Payer: PRIVATE HEALTH INSURANCE | Admitting: Cardiovascular Disease

## 2016-05-28 VITALS — BP 128/70 | HR 62 | Ht 67.0 in | Wt 158.0 lb

## 2016-05-28 DIAGNOSIS — I2510 Atherosclerotic heart disease of native coronary artery without angina pectoris: Secondary | ICD-10-CM

## 2016-05-28 DIAGNOSIS — I480 Paroxysmal atrial fibrillation: Secondary | ICD-10-CM | POA: Diagnosis not present

## 2016-05-28 DIAGNOSIS — I10 Essential (primary) hypertension: Secondary | ICD-10-CM | POA: Diagnosis not present

## 2016-05-28 LAB — COMPREHENSIVE METABOLIC PANEL
ALBUMIN: 4.3 g/dL (ref 3.6–5.1)
ALK PHOS: 68 U/L (ref 40–115)
ALT: 19 U/L (ref 9–46)
AST: 27 U/L (ref 10–35)
BILIRUBIN TOTAL: 0.7 mg/dL (ref 0.2–1.2)
BUN: 22 mg/dL (ref 7–25)
CALCIUM: 9.5 mg/dL (ref 8.6–10.3)
CO2: 24 mmol/L (ref 20–31)
Chloride: 108 mmol/L (ref 98–110)
Creat: 1.11 mg/dL (ref 0.70–1.18)
GLUCOSE: 113 mg/dL — AB (ref 65–99)
POTASSIUM: 4.3 mmol/L (ref 3.5–5.3)
Sodium: 141 mmol/L (ref 135–146)
Total Protein: 6.8 g/dL (ref 6.1–8.1)

## 2016-05-28 LAB — LIPID PANEL
CHOLESTEROL: 101 mg/dL — AB (ref 125–200)
HDL: 37 mg/dL — ABNORMAL LOW (ref >=40)
LDL Cholesterol: 48 mg/dL (ref <130)
TRIGLYCERIDES: 79 mg/dL (ref <150)
Total CHOL/HDL Ratio: 2.7 Ratio (ref <=5.0)
VLDL: 16 mg/dL (ref <30)

## 2016-05-28 NOTE — Progress Notes (Signed)
Cardiology Office Note   Date:  05/28/2016   ID:  Xavier, Bell [birth year], MRN [id number]  PCP:  No PCP Per Patient  Cardiologist:   Mertie Moores, MD   Chief Complaint  Patient presents with  . Coronary Artery Disease   1. Coronary artery disease-status post CABG 2. Atrial flutter- s/p cardioversion 02/08/12 3. Hypertension 4. Right carotid bruit  History of Present Illness:  Has a history of atrial flutter this past spring. He status post cardioversion. [name] done very well. He continues to take Xarelto. [name] not been exercising nearly as much as he needs to. [name] also been careless with his diet and has picked up a few pounds.  March 06, 2013:  He is doing well. No angina. He is continuing to have foot problems - especially of the left foot. He has intense pain in his heel when he walks.  Sept. 12, 2014:  He has done well. Still not exercising as much. Takes motrin at night. He has left plantar faciatis.   March 19, 2014:  Xavier Bell is doing ok No CP or dyspnea. No palpitations. He has gone back into atrial flutter. He cannot tell that his HR is irreg.   He has a hx of a-flutter in the past ( 2014) and was treated with Xarelto and was cardioverted.   May 09, 2015 Xavier Bell is a 75 y.o. male who presents for follow up of his atrial flutter , HTN, CAD No CP , not exercising much .   Nov. 14, 2016: Doing well. BP is a bit high. Not much exercise ,  Sore feet.   May 28, 2016:  Doing well.  BP is normal  No CP , no dyspnea at rest.   Slight DOE with activity. Not exercising   Past Medical History  Diagnosis Date  . Coronary artery disease     s/p remote MI in 1994 with multiple PCI and subsequent CABG x 5 in 1996  . Hypertension   . Dyslipidemia   . MI (myocardial infarction) ([place]) 1995  . Retinal detachment   . Pseudoaphakia     both eyes  . Atrial flutter ([place])     s/p cardioversion 02/08/12; ablation by [name] [phone]  .  Chronic anticoagulation   . Normal nuclear stress test 2006    Past Surgical History  Procedure Laterality Date  . Coronary artery bypass graft  1996    LIMA to LAD, SVG to RV marginal & PD, SVG  to DX and SVG to Ramus  . Cardiac catheterization  1996  . Tonsillectomy and adenoidectomy    . Cardioversion  02/08/2012    Procedure: CARDIOVERSION;  Surgeon: Darden Amber., MD;  Location: [facility] OR;  Service: Cardiovascular;  Laterality: N/A;  . Atrial flutter ablation N/A 12/08/2014    CTI by [name]     Current Outpatient Prescriptions  Medication Sig Dispense Refill  . amLODipine (NORVASC) 10 MG tablet Take 1 tablet (10 mg total) by mouth daily. 90 tablet 2  . aspirin 81 MG tablet Take 81 mg by mouth daily.    [name] atorvastatin (LIPITOR) 40 MG tablet Take 1 tablet (40 mg total) by mouth daily. 90 tablet 3  . carvedilol (COREG) 25 MG tablet Take 1 tablet by mouth 2 (two) times daily.    . Cholecalciferol (VITAMIN D) 2000 units tablet Take 2,000 Units by mouth daily.    . dorzolamide-timolol (COSOPT) 22.3-6.8 MG/ML ophthalmic solution Place 2 drops into  both eyes 2 (two) times daily.    . fenofibrate (TRICOR) 145 MG tablet Take 1 tablet (145 mg total) by mouth daily. 90 tablet 1  . hydrochlorothiazide (HYDRODIURIL) 25 MG tablet Take 25 mg by mouth daily.    [name] losartan (COZAAR) 100 MG tablet TAKE 1 TABLET BY MOUTH DAILY 90 tablet 3  . MAGNESIUM PO Take 250 mg by mouth daily.     . Multiple Vitamin (MULTIVITAMIN) tablet Take 1 tablet by mouth daily.      . Omega-3 Fatty Acids (OMEGA 3 PO) Take 1 capsule by mouth daily.    . potassium chloride SA (K-DUR,KLOR-CON) 20 MEQ tablet Take 1 tablet (20 mEq total) by mouth daily. 90 tablet 2  . Zinc 50 MG CAPS Take 50 mg by mouth daily.      No current facility-administered medications for this visit.    Allergies:   Brimonidine and Niacin and related    Social History:  The patient  reports that he has never smoked. He does not have any  smokeless tobacco history on file. He reports that he does not drink alcohol or use illicit drugs.   Family History:  The patient's family history includes Heart disease in his father and mother.    ROS:  Please see the history of present illness.    Review of Systems: Constitutional:  denies fever, chills, diaphoresis, appetite change and fatigue.  HEENT: denies photophobia, eye pain, redness, hearing loss, ear pain, congestion, sore throat, rhinorrhea, sneezing, neck pain, neck stiffness and tinnitus.  Respiratory: denies SOB, DOE, cough, chest tightness, and wheezing.  Cardiovascular: denies chest pain, palpitations and leg swelling.  Gastrointestinal: denies nausea, vomiting, abdominal pain, diarrhea, constipation, blood in stool.  Genitourinary: denies dysuria, urgency, frequency, hematuria, flank pain and difficulty urinating.  Musculoskeletal: denies  myalgias, back pain, joint swelling, arthralgias and gait problem.   Skin: denies pallor, rash and wound.  Neurological: denies dizziness, seizures, syncope, weakness, light-headedness, numbness and headaches.   Hematological: denies adenopathy, easy bruising, personal or family bleeding history.  Psychiatric/ Behavioral: denies suicidal ideation, mood changes, confusion, nervousness, sleep disturbance and agitation.       All other systems are reviewed and negative.    PHYSICAL EXAM: VS:  BP 128/70 mmHg  Pulse 62  Ht 5\' 7"  (1.702 m)  Wt 158 lb (71.668 kg)  BMI 24.74 kg/m2 , BMI Body mass index is 24.74 kg/(m^2). GEN: Well nourished, well developed, in no acute distress HEENT: normal Neck: no JVD, soft right carotid bruit, , no   masses Cardiac: RRR;  Very soft systolic murmur, rubs, or gallops,no edema  Respiratory:  clear to auscultation bilaterally, normal work of breathing GI: soft, nontender, nondistended, + BS MS: no deformity or atrophy Skin: warm and dry, no rash Neuro:  Strength and sensation are intact Psych:  normal   EKG:  EKG : Sinus brady at 57,  1st degree AV block , RAD.    Recent Labs: 11/07/2015: ALT 25; BUN 22; Creat 1.30*; Potassium 4.2; Sodium 146    Lipid Panel    Component Value Date/Time   CHOL 106* 11/07/2015 0741   TRIG 90 11/07/2015 0741   HDL 36* 11/07/2015 0741   CHOLHDL 2.9 11/07/2015 0741   VLDL 18 11/07/2015 0741   LDLCALC 52 11/07/2015 0741      Wt Readings from Last 3 Encounters:  05/28/16 158 lb (71.668 kg)  11/07/15 159 lb 6.4 oz (72.303 kg)  05/09/15 159 lb 3.2 oz (72.213 kg)  Other studies Reviewed: Additional studies/ records that were reviewed today include: . Review of the above records demonstrates:    ASSESSMENT AND PLAN:  1. Coronary artery disease-status post CABG - no angina  doing very well.  2. Atrial flutter- s/p cardioversion 02/08/12 - has maintaned NSR  3. Hypertension - BP is well controlled.   Encouraged him to exercise and avoid salt .   4. Right carotid bruit-  Carotid is stable.   Recheck in Dec. 2018.    Current medicines are reviewed at length with the patient today.  The patient does not have concerns regarding medicines.  The following changes have been made:  no change  Labs/ tests ordered today include:  No orders of the defined types were placed in this encounter.     Disposition:   FU with me in 6 months .   Will get fasting labs      Mertie Moores, MD  05/28/2016 7:59 AM    [facility] [place], [place], [place]  [address] Phone: [phone]; Fax: [phone]   [facility]  [address] [place], [place]  [address] [phone]    Fax [phone]

## 2016-05-28 NOTE — Patient Instructions (Signed)
Medication Instructions:  Your physician recommends that you continue on your current medications as directed. Please refer to the Current Medication list given to you today.   Labwork: TODAY - cholesterol, complete metabolic panel   Testing/Procedures: None Ordered   Follow-Up: Your physician wants you to follow-up in: 6 months with Dr. [name].  You will receive a reminder letter in the mail two months in advance. If you don't receive a letter, please call our office to schedule the follow-up appointment.   If you need a refill on your cardiac medications before your next appointment, please call your pharmacy.   Thank you for choosing [facility]! Tianni Escamilla, RN [phone]    

## 2016-07-07 ENCOUNTER — Other Ambulatory Visit: Payer: Self-pay | Admitting: Cardiovascular Disease

## 2016-08-04 ENCOUNTER — Other Ambulatory Visit: Payer: Self-pay | Admitting: Cardiovascular Disease

## 2016-08-13 DIAGNOSIS — Z961 Presence of intraocular lens: Secondary | ICD-10-CM | POA: Insufficient documentation

## 2016-10-06 ENCOUNTER — Other Ambulatory Visit: Payer: Self-pay | Admitting: Cardiovascular Disease

## 2016-11-03 ENCOUNTER — Other Ambulatory Visit: Payer: Self-pay | Admitting: Cardiovascular Disease

## 2016-11-13 ENCOUNTER — Encounter: Payer: Self-pay | Admitting: Cardiovascular Disease

## 2016-11-14 ENCOUNTER — Encounter: Payer: Self-pay | Admitting: Cardiovascular Disease

## 2016-11-26 ENCOUNTER — Ambulatory Visit: Payer: PRIVATE HEALTH INSURANCE | Admitting: Cardiovascular Disease

## 2016-12-04 ENCOUNTER — Other Ambulatory Visit: Payer: Self-pay | Admitting: Cardiovascular Disease

## 2016-12-07 ENCOUNTER — Ambulatory Visit: Payer: PRIVATE HEALTH INSURANCE | Admitting: Cardiovascular Disease

## 2016-12-28 ENCOUNTER — Other Ambulatory Visit: Payer: Self-pay | Admitting: Cardiovascular Disease

## 2016-12-31 ENCOUNTER — Encounter: Payer: Self-pay | Admitting: Cardiovascular Disease

## 2016-12-31 ENCOUNTER — Ambulatory Visit (INDEPENDENT_AMBULATORY_CARE_PROVIDER_SITE_OTHER): Payer: PRIVATE HEALTH INSURANCE | Admitting: Cardiovascular Disease

## 2016-12-31 ENCOUNTER — Encounter (INDEPENDENT_AMBULATORY_CARE_PROVIDER_SITE_OTHER): Payer: Self-pay

## 2016-12-31 VITALS — BP 144/60 | HR 56 | Ht 67.0 in | Wt 164.8 lb

## 2016-12-31 DIAGNOSIS — I2510 Atherosclerotic heart disease of native coronary artery without angina pectoris: Secondary | ICD-10-CM | POA: Diagnosis not present

## 2016-12-31 DIAGNOSIS — I10 Essential (primary) hypertension: Secondary | ICD-10-CM

## 2016-12-31 DIAGNOSIS — E782 Mixed hyperlipidemia: Secondary | ICD-10-CM

## 2016-12-31 HISTORY — DX: Mixed hyperlipidemia: E78.2

## 2016-12-31 NOTE — Progress Notes (Signed)
Cardiology Office Note   Date:  12/31/2016   ID:  Randolf, Feick [birth year], MRN [id number]  PCP:  No PCP Per Patient  Cardiologist:   Mertie Moores, MD   Chief Complaint  Patient presents with  . Follow-up    CAD   1. Coronary artery disease-status post CABG 2. Atrial flutter- s/p cardioversion 02/08/12 3. Hypertension 4. Right carotid bruit  History of Present Illness:  Has a history of atrial flutter this past spring. He status post cardioversion. [name] done very well. He continues to take Xarelto. [name] not been exercising nearly as much as he needs to. [name] also been careless with his diet and has picked up a few pounds.  March 06, 2013:  He is doing well. No angina. He is continuing to have foot problems - especially of the left foot. He has intense pain in his heel when he walks.  Sept. 12, 2014:  He has done well. Still not exercising as much. Takes motrin at night. He has left plantar faciatis.   March 19, 2014:  [name] is doing ok No CP or dyspnea. No palpitations. He has gone back into atrial flutter. He cannot tell that his HR is irreg.   He has a hx of a-flutter in the past ( 2014) and was treated with Xarelto and was cardioverted.   May 09, 2015 Yuvan Prieur Gattuso is a 76 y.o. male who presents for follow up of his atrial flutter , HTN, CAD No CP , not exercising much .   Nov. 14, 2016: Doing well. BP is a bit high. Not much exercise ,  Sore feet.   May 28, 2016:  Doing well.  BP is normal  No CP , no dyspnea at rest.   Slight DOE with activity. Not exercising   Jan. 8, 2018:  [name] is seen today .   Doing well.   Not exercising much .   Knows that he needs to.    Past Medical History:  Diagnosis Date  . Atrial flutter ([place])    s/p cardioversion 02/08/12; ablation by [name] [phone]  . Chronic anticoagulation   . Coronary artery disease    s/p remote MI in 1994 with multiple PCI and subsequent CABG x 5 in 1996  .  Dyslipidemia   . Hypertension   . MI (myocardial infarction) 1995  . Normal nuclear stress test 2006  . Pseudoaphakia    both eyes  . Retinal detachment     Past Surgical History:  Procedure Laterality Date  . ATRIAL FLUTTER ABLATION N/A 12/08/2014   CTI by [name]  . CARDIAC CATHETERIZATION  1996  . CARDIOVERSION  02/08/2012   Procedure: CARDIOVERSION;  Surgeon: Darden Amber., MD;  Location: [facility];  Service: Cardiovascular;  Laterality: N/A;  . CORONARY ARTERY BYPASS GRAFT  1996   LIMA to LAD, SVG to RV marginal & PD, SVG  to DX and SVG to Ramus  . TONSILLECTOMY AND ADENOIDECTOMY       Current Outpatient Prescriptions  Medication Sig Dispense Refill  . amLODipine (NORVASC) 10 MG tablet TAKE 1 TABLET DAILY 90 tablet 1  . aspirin 81 MG tablet Take 81 mg by mouth daily.    [name] atorvastatin (LIPITOR) 40 MG tablet Take 1 tablet (40 mg total) by mouth daily. 90 tablet 3  . carvedilol (COREG) 25 MG tablet Take 1 tablet by mouth 2 (two) times daily.    . Cholecalciferol (VITAMIN D) 2000 units tablet Take  2,000 Units by mouth daily.    . dorzolamide-timolol (COSOPT) 22.3-6.8 MG/ML ophthalmic solution Place 2 drops into both eyes 2 (two) times daily.    . fenofibrate (TRICOR) 145 MG tablet TAKE 1 TABLET DAILY 90 tablet 2  . hydrochlorothiazide (HYDRODIURIL) 25 MG tablet Take 25 mg by mouth daily.    [name] losartan (COZAAR) 100 MG tablet TAKE 1 TABLET DAILY 90 tablet 2  . MAGNESIUM PO Take 250 mg by mouth daily.     . Multiple Vitamin (MULTIVITAMIN) tablet Take 1 tablet by mouth daily.      . Omega-3 Fatty Acids (OMEGA 3 PO) Take 1 capsule by mouth daily.    . potassium chloride SA (K-DUR,KLOR-CON) 20 MEQ tablet Take 1 tablet (20 mEq total) by mouth daily. 90 tablet 1  . Zinc 50 MG CAPS Take 50 mg by mouth daily.      No current facility-administered medications for this visit.     Allergies:   Brimonidine and Niacin and related    Social History:  The patient  reports that he has  never smoked. He has never used smokeless tobacco. He reports that he does not drink alcohol or use drugs.   Family History:  The patient's family history includes Heart disease in his father and mother.    ROS:  Please see the history of present illness.    Review of Systems: Constitutional:  denies fever, chills, diaphoresis, appetite change and fatigue.  HEENT: denies photophobia, eye pain, redness, hearing loss, ear pain, congestion, sore throat, rhinorrhea, sneezing, neck pain, neck stiffness and tinnitus.  Respiratory: denies SOB, DOE, cough, chest tightness, and wheezing.  Cardiovascular: denies chest pain, palpitations and leg swelling.  Gastrointestinal: denies nausea, vomiting, abdominal pain, diarrhea, constipation, blood in stool.  Genitourinary: denies dysuria, urgency, frequency, hematuria, flank pain and difficulty urinating.  Musculoskeletal: denies  myalgias, back pain, joint swelling, arthralgias and gait problem.   Skin: denies pallor, rash and wound.  Neurological: denies dizziness, seizures, syncope, weakness, light-headedness, numbness and headaches.   Hematological: denies adenopathy, easy bruising, personal or family bleeding history.  Psychiatric/ Behavioral: denies suicidal ideation, mood changes, confusion, nervousness, sleep disturbance and agitation.       All other systems are reviewed and negative.    PHYSICAL EXAM: VS:  BP (!) 144/60 (BP Location: Left Arm, Patient Position: Sitting, Cuff Size: Normal)   Pulse (!) 56   Ht 5\' 7"  (1.702 m)   Wt 164 lb 12.8 oz (74.8 kg)   SpO2 96%   BMI 25.81 kg/m  , BMI Body mass index is 25.81 kg/m. GEN: Well nourished, well developed, in no acute distress  HEENT: normal  Neck: no JVD, soft right carotid bruit, , no   masses Cardiac: RRR;  Very soft systolic murmur, rubs, or gallops,no edema  Respiratory:  clear to auscultation bilaterally, normal work of breathing GI: soft, nontender, nondistended, + BS MS: no  deformity or atrophy  Skin: warm and dry, no rash Neuro:  Strength and sensation are intact Psych: normal   EKG:  EKG : Sinus brady at 56,  1st degree AV block ,  Occasional PACS,  RAD.  Recent Labs: 05/28/2016: ALT 19; BUN 22; Creat 1.11; Potassium 4.3; Sodium 141    Lipid Panel    Component Value Date/Time   CHOL 101 (L) 05/28/2016 0816   TRIG 79 05/28/2016 0816   HDL 37 (L) 05/28/2016 0816   CHOLHDL 2.7 05/28/2016 0816   VLDL 16 05/28/2016 0816  LDLCALC 48 05/28/2016 0816      Wt Readings from Last 3 Encounters:  12/31/16 164 lb 12.8 oz (74.8 kg)  05/28/16 158 lb (71.7 kg)  11/07/15 159 lb 6.4 oz (72.3 kg)      Other studies Reviewed: Additional studies/ records that were reviewed today include: . Review of the above records demonstrates:    ASSESSMENT AND PLAN:  1. Coronary artery disease-status post CABG - no angina  doing very well.  2. Atrial flutter- s/p cardioversion 02/08/12 - has maintaned NSR  3. Hypertension - BP is well controlled.   Encouraged him to exercise and avoid salt .   4. Right carotid bruit-  Carotid is stable.   Recheck in Dec. 2018.   5. Hyperlipidemia:  Continue current medications. We'll check fasting labs today.   Current medicines are reviewed at length with the patient today.  The patient does not have concerns regarding medicines.  The following changes have been made:  no change  Labs/ tests ordered today include:  No orders of the defined types were placed in this encounter.    Disposition:   FU with me in 6 months .   Will get fasting labs today .      Mertie Moores, MD  12/31/2016 9:20 AM    [facility] [place], [place], [place]  [address] Phone: [phone]; Fax: [phone]

## 2016-12-31 NOTE — Patient Instructions (Signed)
Medication Instructions:  Your physician recommends that you continue on your current medications as directed. Please refer to the Current Medication list given to you today.   Labwork: TODAY - cholesterol, complete metabolic panel   Testing/Procedures: None Ordered   Follow-Up: Your physician wants you to follow-up in: 6 months with Dr. [name].  You will receive a reminder letter in the mail two months in advance. If you don't receive a letter, please call our office to schedule the follow-up appointment.   If you need a refill on your cardiac medications before your next appointment, please call your pharmacy.   Thank you for choosing [facility]! Janae Bonser, RN [phone]    

## 2017-01-01 LAB — COMPREHENSIVE METABOLIC PANEL
ALBUMIN: 4.5 g/dL (ref 3.5–4.8)
ALT: 23 IU/L (ref 0–44)
AST: 31 IU/L (ref 0–40)
Albumin/Globulin Ratio: 1.9 (ref 1.2–2.2)
Alkaline Phosphatase: 71 IU/L (ref 39–117)
BUN/Creatinine Ratio: 20 (ref 10–24)
BUN: 24 mg/dL (ref 8–27)
Bilirubin Total: 0.5 mg/dL (ref 0.0–1.2)
CHLORIDE: 105 mmol/L (ref 96–106)
CO2: 24 mmol/L (ref 18–29)
CREATININE: 1.18 mg/dL (ref 0.76–1.27)
Calcium: 10.5 mg/dL — ABNORMAL HIGH (ref 8.6–10.2)
GFR calc non Af Amer: 59 mL/min/{1.73_m2} — ABNORMAL LOW (ref >59)
GFR, EST AFRICAN AMERICAN: 68 mL/min/{1.73_m2} (ref >59)
GLUCOSE: 75 mg/dL (ref 65–99)
Globulin, Total: 2.4 g/dL (ref 1.5–4.5)
Potassium: 4.2 mmol/L (ref 3.5–5.2)
Sodium: 144 mmol/L (ref 134–144)
TOTAL PROTEIN: 6.9 g/dL (ref 6.0–8.5)

## 2017-01-01 LAB — LIPID PANEL
Chol/HDL Ratio: 3.2 ratio units (ref 0.0–5.0)
Cholesterol, Total: 108 mg/dL (ref 100–199)
HDL: 34 mg/dL — AB (ref >39)
LDL Calculated: 50 mg/dL (ref 0–99)
TRIGLYCERIDES: 119 mg/dL (ref 0–149)
VLDL CHOLESTEROL CAL: 24 mg/dL (ref 5–40)

## 2017-05-14 ENCOUNTER — Other Ambulatory Visit: Payer: Self-pay | Admitting: Cardiovascular Disease

## 2017-05-14 MED ORDER — ATORVASTATIN CALCIUM 40 MG PO TABS: 40.0000 mg | ORAL_TABLET | Freq: Every day | ORAL | 1 refills | Status: DC

## 2017-05-25 ENCOUNTER — Other Ambulatory Visit: Payer: Self-pay | Admitting: Cardiovascular Disease

## 2017-06-04 ENCOUNTER — Other Ambulatory Visit: Payer: Self-pay | Admitting: Cardiovascular Disease

## 2017-07-10 ENCOUNTER — Other Ambulatory Visit: Payer: Self-pay | Admitting: Cardiovascular Disease

## 2017-07-10 MED ORDER — HYDROCHLOROTHIAZIDE 25 MG PO TABS: 25.0000 mg | ORAL_TABLET | Freq: Every day | ORAL | 1 refills | Status: DC

## 2017-07-29 ENCOUNTER — Telehealth: Payer: Self-pay | Admitting: *Deleted

## 2017-07-29 MED ORDER — CARVEDILOL 25 MG PO TABS: 25.0000 mg | ORAL_TABLET | Freq: Two times a day (BID) | ORAL | 0 refills | Status: DC

## 2017-07-29 MED ORDER — CARVEDILOL 25 MG PO TABS: 25.0000 mg | ORAL_TABLET | Freq: Two times a day (BID) | ORAL | 1 refills | Status: DC

## 2017-07-29 NOTE — Telephone Encounter (Signed)
Pt to office requesting written prescription for coreg 25 mg bid 30 days to take to [place] where he works, also would like refill to [facility] for 90 days  Dr Rayann Heman has provided a written prescription for 30 days, refill electronically to [facility] for 90 days.

## 2017-08-03 ENCOUNTER — Other Ambulatory Visit: Payer: Self-pay | Admitting: Cardiovascular Disease

## 2017-08-09 ENCOUNTER — Ambulatory Visit (INDEPENDENT_AMBULATORY_CARE_PROVIDER_SITE_OTHER): Payer: PRIVATE HEALTH INSURANCE | Admitting: Cardiovascular Disease

## 2017-08-09 ENCOUNTER — Encounter: Payer: Self-pay | Admitting: Cardiovascular Disease

## 2017-08-09 VITALS — BP 122/80 | HR 62 | Ht 67.0 in | Wt 162.8 lb

## 2017-08-09 DIAGNOSIS — I779 Disorder of arteries and arterioles, unspecified: Secondary | ICD-10-CM | POA: Diagnosis not present

## 2017-08-09 DIAGNOSIS — I2510 Atherosclerotic heart disease of native coronary artery without angina pectoris: Secondary | ICD-10-CM | POA: Diagnosis not present

## 2017-08-09 DIAGNOSIS — I739 Peripheral vascular disease, unspecified: Secondary | ICD-10-CM

## 2017-08-09 DIAGNOSIS — I10 Essential (primary) hypertension: Secondary | ICD-10-CM

## 2017-08-09 DIAGNOSIS — Z125 Encounter for screening for malignant neoplasm of prostate: Secondary | ICD-10-CM | POA: Diagnosis not present

## 2017-08-09 NOTE — Patient Instructions (Addendum)
Medication Instructions:  Your physician recommends that you continue on your current medications as directed. Please refer to the Current Medication list given to you today.   Labwork: TODAY - CBC, BMET, Liver panel, Cholesterol, PSA   Testing/Procedures: Your physician has requested that you have a carotid duplex. This test is an ultrasound of the carotid arteries in your neck. It looks at blood flow through these arteries that supply the brain with blood. Allow one hour for this exam. There are no restrictions or special instructions.    Follow-Up: Your physician wants you to follow-up in: 6 months with Dr. Acie Fredrickson.  You will receive a reminder letter in the mail two months in advance. If you don't receive a letter, please call our office to schedule the follow-up appointment.   If you need a refill on your cardiac medications before your next appointment, please call your pharmacy.   Thank you for choosing [facility]! Christen Bame, RN [phone]

## 2017-08-09 NOTE — Progress Notes (Signed)
Cardiology Office Note   Date:  08/09/2017   ID:  Xavier Bell, Xavier Bell, Xavier Bell, MRN [id number]  PCP:  Patient, No Pcp Per  Cardiologist:   Mertie Moores, MD   Chief Complaint  Patient presents with  . Coronary Artery Disease   1. Coronary artery disease-status post CABG 2. Atrial flutter- s/p cardioversion 2/15/Bell 3. Hypertension 4. Right carotid bruit  History of Present Illness:  Has a history of atrial flutter this past spring. He status post cardioversion. [name] done very well. He continues to take Xarelto. [name] not been exercising nearly as much as he needs to. [name] also been careless with his diet and has picked up a few pounds.  March 06, 2013:  He is doing well. No angina. He is continuing to have foot problems - especially of the left foot. He has intense pain in his heel when he walks.  Sept. 12, 2014:  He has done well. Still not exercising as much. Takes motrin at night. He has left plantar faciatis.   March 19, 2014:  [name] is doing ok No CP or dyspnea. No palpitations. He has gone back into atrial flutter. He cannot tell that his HR is irreg.   He has a hx of a-flutter in the past ( 2014) and was treated with Xarelto and was cardioverted.   May 09, 2015 Ramses Klecka Boylen is a 76 y.o. male who presents for follow up of his atrial flutter , HTN, CAD No CP , not exercising much .   Nov. 14, 2016: Doing well. BP is a bit high. Not much exercise ,  Sore feet.   May 28, 2016:  Doing well.  BP is normal  No CP , no dyspnea at rest.   Slight DOE with activity. Not exercising   Jan. 8, 2018:  [name] is seen today .   Doing well.   Not exercising much .   Knows that he needs to.  Aug. 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  No CP , no dyspnea      Past Medical History:  Diagnosis Date  . Atrial flutter ([place])    s/p cardioversion 2/15/Bell; ablation by [name] [phone]  . Chronic anticoagulation   . Coronary artery disease      s/p remote MI in 1994 with multiple PCI and subsequent CABG x 5 in 1996  . Dyslipidemia   . Hypertension   . MI (myocardial infarction) ([place]) 1995  . Normal nuclear stress test 2006  . Pseudoaphakia    both eyes  . Retinal detachment     Past Surgical History:  Procedure Laterality Date  . ATRIAL FLUTTER ABLATION N/A 12/08/2014   CTI by [name]  . CARDIAC CATHETERIZATION  1996  . CARDIOVERSION  02/08/2012   Procedure: CARDIOVERSION;  Surgeon: Darden Amber., MD;  Location: [facility] OR;  Service: Cardiovascular;  Laterality: N/A;  . CORONARY ARTERY BYPASS GRAFT  1996   LIMA to LAD, SVG to RV marginal & PD, SVG  to DX and SVG to Ramus  . TONSILLECTOMY AND ADENOIDECTOMY       Current Outpatient Prescriptions  Medication Sig Dispense Refill  . amLODipine (NORVASC) 10 MG tablet TAKE 1 TABLET DAILY 90 tablet 1  . aspirin 81 MG tablet Take 81 mg by mouth daily.    [name] atorvastatin (LIPITOR) 40 MG tablet Take 1 tablet (40 mg total) by mouth daily. 90 tablet 1  . carvedilol (COREG) 25 MG tablet Take 1  tablet (25 mg total) by mouth 2 (two) times daily. 90 tablet 0  . Cholecalciferol (VITAMIN D) 2000 units tablet Take 2,000 Units by mouth daily.    . dorzolamide-timolol (COSOPT) 22.3-6.8 MG/ML ophthalmic solution Place 2 drops into both eyes 2 (two) times daily.    . fenofibrate (TRICOR) 145 MG tablet Take 1 tablet (145 mg total) by mouth daily. 90 tablet 1  . hydrochlorothiazide (HYDRODIURIL) 25 MG tablet Take 1 tablet (25 mg total) by mouth daily. 90 tablet 1  . latanoprost (XALATAN) 0.005 % ophthalmic solution Place 1 drop into the right eye at bedtime.    [name] losartan (COZAAR) 100 MG tablet TAKE 1 TABLET DAILY 90 tablet 1  . MAGNESIUM PO Take 250 mg by mouth daily.     . Multiple Vitamin (MULTIVITAMIN) tablet Take 1 tablet by mouth daily.      . Omega-3 Fatty Acids (OMEGA 3 PO) Take 1 capsule by mouth daily.    . potassium chloride SA (K-DUR,KLOR-CON) 20 MEQ tablet TAKE 1 TABLET DAILY 90  tablet 1  . Zinc 50 MG CAPS Take 50 mg by mouth daily.      No current facility-administered medications for this visit.     Allergies:   Brimonidine and Niacin and related    Social History:  The patient  reports that he has never smoked. He has never used smokeless tobacco. He reports that he does not drink alcohol or use drugs.   Family History:  The patient's family history includes Coronary artery disease in his unknown relative; Heart disease in his father and mother.    ROS:  Please see the history of present illness.    Review of Systems: Constitutional:  denies fever, chills, diaphoresis, appetite change and fatigue.  HEENT: denies photophobia, eye pain, redness, hearing loss, ear pain, congestion, sore throat, rhinorrhea, sneezing, neck pain, neck stiffness and tinnitus.  Respiratory: denies SOB, DOE, cough, chest tightness, and wheezing.  Cardiovascular: denies chest pain, palpitations and leg swelling.  Gastrointestinal: denies nausea, vomiting, abdominal pain, diarrhea, constipation, blood in stool.  Genitourinary: denies dysuria, urgency, frequency, hematuria, flank pain and difficulty urinating.  Musculoskeletal: denies  myalgias, back pain, joint swelling, arthralgias and gait problem.   Skin: denies pallor, rash and wound.  Neurological: denies dizziness, seizures, syncope, weakness, light-headedness, numbness and headaches.   Hematological: denies adenopathy, easy bruising, personal or family bleeding history.  Psychiatric/ Behavioral: denies suicidal ideation, mood changes, confusion, nervousness, sleep disturbance and agitation.       All other systems are reviewed and negative.    PHYSICAL EXAM: VS:  BP 122/80   Pulse 62   Ht 5\' 7"  (1.702 m)   Wt 162 lb 12.8 oz (73.8 kg)   SpO2 94%   BMI 25.50 kg/m  , BMI Body mass index is 25.5 kg/m. GEN: Well nourished, well developed, in no acute distress  HEENT: normal  Neck: no JVD, soft right carotid bruit, ,  no   masses Cardiac: RRR;  Very soft systolic murmur, rubs, or gallops,no edema  Respiratory:  clear to auscultation bilaterally, normal work of breathing GI: soft, nontender, nondistended, + BS MS: no deformity or atrophy  Skin: warm and dry, no rash Neuro:  Strength and sensation are intact Psych: normal   EKG:  EKG : Sinus brady at 56,  1st degree AV block ,  Occasional PACS,  RAD.  Recent Labs: 12/31/2016: ALT 23; BUN 24; Creatinine, Ser 1.18; Potassium 4.2; Sodium 144    Lipid  Panel    Component Value Date/Time   CHOL 108 12/31/2016 0936   TRIG 119 12/31/2016 0936   HDL 34 (L) 12/31/2016 0936   CHOLHDL 3.2 12/31/2016 0936   CHOLHDL 2.7 05/28/2016 0816   VLDL 16 05/28/2016 0816   LDLCALC 50 12/31/2016 0936      Wt Readings from Last 3 Encounters:  08/09/17 162 lb 12.8 oz (73.8 kg)  12/31/16 164 lb 12.8 oz (74.8 kg)  05/28/16 158 lb (71.7 kg)      Other studies Reviewed: Additional studies/ records that were reviewed today include: . Review of the above records demonstrates:    ASSESSMENT AND PLAN:  1. Coronary artery disease-status post CABG - no angina  doing very well.  2. Atrial flutter- s/p cardioversion 2/15/Bell - has maintaned NSR  3. Hypertension - BP is well controlled.   Encouraged him to exercise and avoid salt .   4. Right carotid bruit-  Carotid is stable.   Recheck in Dec. 2018.   5. Hyperlipidemia:  Continue current medications. We'll check fasting labs today.   Current medicines are reviewed at length with the patient today.  The patient does not have concerns regarding medicines.  The following changes have been made:  no change  Labs/ tests ordered today include:  No orders of the defined types were placed in this encounter.    Disposition:   FU with me in 6 months .   Will get fasting labs today .      Mertie Moores, MD  08/09/2017 4:08 PM    [facility] [place], [place], [place]  [address] Phone:  [phone]; Fax: [phone]

## 2017-08-10 LAB — BASIC METABOLIC PANEL
BUN / CREAT RATIO: 19 (ref 10–24)
BUN: 26 mg/dL (ref 8–27)
CHLORIDE: 106 mmol/L (ref 96–106)
CO2: 22 mmol/L (ref 20–29)
Calcium: 9.7 mg/dL (ref 8.6–10.2)
Creatinine, Ser: 1.36 mg/dL — ABNORMAL HIGH (ref 0.76–1.27)
GFR calc Af Amer: 57 mL/min/{1.73_m2} — ABNORMAL LOW (ref >59)
GFR calc non Af Amer: 49 mL/min/{1.73_m2} — ABNORMAL LOW (ref >59)
GLUCOSE: 112 mg/dL — AB (ref 65–99)
Potassium: 3.9 mmol/L (ref 3.5–5.2)
SODIUM: 144 mmol/L (ref 134–144)

## 2017-08-10 LAB — CBC
Hematocrit: 37.5 % (ref 37.5–51.0)
Hemoglobin: 12.4 g/dL — ABNORMAL LOW (ref 13.0–17.7)
MCH: 29.7 pg (ref 26.6–33.0)
MCHC: 33.1 g/dL (ref 31.5–35.7)
MCV: 90 fL (ref 79–97)
PLATELETS: 296 10*3/uL (ref 150–379)
RBC: 4.18 x10E6/uL (ref 4.14–5.80)
RDW: 13.9 % (ref 12.3–15.4)
WBC: 5.8 10*3/uL (ref 3.4–10.8)

## 2017-08-10 LAB — LIPID PANEL
Chol/HDL Ratio: 3.8 ratio (ref 0.0–5.0)
Cholesterol, Total: 121 mg/dL (ref 100–199)
HDL: 32 mg/dL — ABNORMAL LOW (ref >39)
LDL Calculated: 61 mg/dL (ref 0–99)
Triglycerides: 141 mg/dL (ref 0–149)
VLDL CHOLESTEROL CAL: 28 mg/dL (ref 5–40)

## 2017-08-10 LAB — HEPATIC FUNCTION PANEL
ALBUMIN: 4.4 g/dL (ref 3.5–4.8)
ALK PHOS: 83 IU/L (ref 39–117)
ALT: 24 IU/L (ref 0–44)
AST: 33 IU/L (ref 0–40)
BILIRUBIN TOTAL: 0.4 mg/dL (ref 0.0–1.2)
Bilirubin, Direct: 0.16 mg/dL (ref 0.00–0.40)
TOTAL PROTEIN: 7.1 g/dL (ref 6.0–8.5)

## 2017-08-10 LAB — PSA: PROSTATE SPECIFIC AG, SERUM: 0.8 ng/mL (ref 0.0–4.0)

## 2017-09-06 ENCOUNTER — Other Ambulatory Visit: Payer: Self-pay

## 2017-09-06 MED ORDER — CARVEDILOL 25 MG PO TABS: 25.0000 mg | ORAL_TABLET | Freq: Two times a day (BID) | ORAL | 1 refills | Status: DC

## 2017-11-09 ENCOUNTER — Other Ambulatory Visit: Payer: Self-pay | Admitting: Cardiovascular Disease

## 2017-12-02 ENCOUNTER — Other Ambulatory Visit: Payer: Self-pay | Admitting: Cardiovascular Disease

## 2017-12-05 ENCOUNTER — Other Ambulatory Visit: Payer: Self-pay | Admitting: Cardiovascular Disease

## 2017-12-20 ENCOUNTER — Other Ambulatory Visit: Payer: Self-pay | Admitting: Cardiovascular Disease

## 2018-01-18 ENCOUNTER — Other Ambulatory Visit: Payer: Self-pay

## 2018-01-18 ENCOUNTER — Encounter: Payer: Self-pay | Admitting: Emergency Medicine

## 2018-01-18 ENCOUNTER — Ambulatory Visit: Payer: PRIVATE HEALTH INSURANCE | Admitting: Emergency Medicine

## 2018-01-18 VITALS — BP 137/67 | HR 70 | Temp 97.4°F | Resp 16 | Ht 65.5 in | Wt 155.2 lb

## 2018-01-18 DIAGNOSIS — M5432 Sciatica, left side: Secondary | ICD-10-CM | POA: Diagnosis not present

## 2018-01-18 MED ORDER — TRAMADOL HCL 50 MG PO TABS: 50.0000 mg | ORAL_TABLET | Freq: Three times a day (TID) | ORAL | 0 refills | Status: AC | PRN

## 2018-01-18 MED ORDER — KETOROLAC TROMETHAMINE 60 MG/2ML IM SOLN
60.0000 mg | Freq: Once | INTRAMUSCULAR | Status: AC
  Administered 2018-01-18: 60 mg via INTRAMUSCULAR

## 2018-01-18 NOTE — Progress Notes (Signed)
Xavier Bell 77 y.o.   Chief Complaint  Patient presents with  . Establish Care  . Tailbone Pain     x 6 days - to LEFT calf muscle    HISTORY OF PRESENT ILLNESS: This is a 77 y.o. male complaining of pain to left tailbone/hip area radiating down the back of left leg for the past 6-7 days; denies injury.  Back Pain  This is a new problem. The current episode started in the past 7 days. The problem occurs constantly. The problem has been gradually worsening since onset. The pain is present in the gluteal and lumbar spine. The quality of the pain is described as aching and stabbing. The pain radiates to the left knee and left thigh. The pain is at a severity of 7/10. The pain is moderate. The pain is the same all the time. The symptoms are aggravated by position. Pertinent negatives include no abdominal pain, bladder incontinence, bowel incontinence, chest pain, dysuria, fever, headaches, numbness, paresis, paresthesias, pelvic pain, perianal numbness, tingling, weakness or weight loss. Risk factors: none. He has tried nothing for the symptoms.     Prior to Admission medications   Medication Sig Start Date End Date Taking? Authorizing Provider  amLODipine (NORVASC) 10 MG tablet TAKE 1 TABLET DAILY (KEEP UPCOMING APPOINTMENT FOR FUTURE REFILLS) 12/03/17  Yes [name], [name], MD  aspirin 81 MG tablet Take 81 mg by mouth daily.   Yes [provider]  atorvastatin (LIPITOR) 40 MG tablet Take 1 tablet (40 mg total) daily by mouth. 11/11/17  Yes [name], [name], MD  carvedilol (COREG) 25 MG tablet Take 1 tablet (25 mg total) by mouth 2 (two) times daily. 09/06/17  Yes [name], [name], MD  Cholecalciferol (VITAMIN D) 2000 units tablet Take 2,000 Units by mouth daily.   Yes [provider]  dorzolamide-timolol (COSOPT) 22.3-6.8 MG/ML ophthalmic solution Place 2 drops into both eyes 2 (two) times daily.   Yes [provider]  fenofibrate (TRICOR) 145 MG tablet Take  1 tablet (145 mg total) by mouth daily. 08/05/17  Yes [name], [name], MD  hydrochlorothiazide (HYDRODIURIL) 25 MG tablet Take 1 tablet (25 mg total) by mouth daily. 07/10/17  Yes [name], [name], MD  latanoprost (XALATAN) 0.005 % ophthalmic solution Place 1 drop into the right eye at bedtime. 07/15/17  Yes [provider]  losartan (COZAAR) 100 MG tablet TAKE 1 TABLET DAILY 12/05/17  Yes [name], [name], MD  Multiple Vitamin (MULTIVITAMIN) tablet Take 1 tablet by mouth daily.     Yes [provider]  Omega-3 Fatty Acids (OMEGA 3 PO) Take 1 capsule by mouth daily.   Yes [provider]  potassium chloride SA (K-DUR,KLOR-CON) 20 MEQ tablet TAKE 1 TABLET DAILY 12/20/17  Yes [name], [name], MD  Zinc 50 MG CAPS Take 50 mg by mouth daily.    Yes [provider]  MAGNESIUM PO Take 250 mg by mouth daily.     [provider]    Allergies  Allergen Reactions  . Brimonidine     unknown  . Niacin And Related Other (See Comments)    Stomach Pain and flushing     Patient Active Problem List   Diagnosis Date Noted  . Mixed hyperlipidemia 12/31/2016  . Carotid artery disease ([place]) 09/04/2013  . HTN (hypertension) 12/31/2011  . Vitamin D deficiency 12/31/2011  . Atrial flutter ([place]) 11/09/2011  . Coronary artery disease   . Hypertension   . Dyslipidemia   .  MI (myocardial infarction) ([place])   . Retinal detachment   . Pseudoaphakia   . Atrial fibrillation [facility])     Past Medical History:  Diagnosis Date  . Atrial flutter ([place])    s/p cardioversion 02/08/12; ablation by [name] [phone]  . Chronic anticoagulation   . Coronary artery disease    s/p remote MI in 1994 with multiple PCI and subsequent CABG x 5 in 1996  . Dyslipidemia   . Hypertension   . MI (myocardial infarction) ([place]) 1995  . Normal nuclear stress test 2006  . Pseudoaphakia    both eyes  . Retinal detachment     Past Surgical History:  Procedure Laterality Date  . ATRIAL  FLUTTER ABLATION N/A 12/08/2014   CTI by [name]  . CARDIAC CATHETERIZATION  1996  . CARDIOVERSION  02/08/2012   Procedure: CARDIOVERSION;  Surgeon: Darden Amber., MD;  [facility] OR;  Service: Cardiovascular;  Laterality: N/A;  . CORONARY ARTERY BYPASS GRAFT  1996   LIMA to LAD, SVG to RV marginal & PD, SVG  to DX and SVG to Ramus  . TONSILLECTOMY AND ADENOIDECTOMY      Social History   Socioeconomic History  . Marital status: Married    Spouse name: Not on file  . Number of children: Not on file  . Years of education: Not on file  . Highest education level: Not on file  Social Needs  . Financial resource strain: Not on file  . Food insecurity - worry: Not on file  . Food insecurity - inability: Not on file  . Transportation needs - medical: Not on file  . Transportation needs - non-medical: Not on file  Occupational History  . Not on file  Tobacco Use  . Smoking status: Never Smoker  . Smokeless tobacco: Never Used  Substance and Sexual Activity  . Alcohol use: No  . Drug use: No  . Sexual activity: Not on file  Other Topics Concern  . Not on file  Social History Narrative  . Not on file    Family History  Problem Relation Age of Onset  . Heart disease Mother   . Heart disease Father   . Coronary artery disease Unknown      Review of Systems  Constitutional: Negative for fever and weight loss.  HENT: Negative.   Eyes: Negative.   Respiratory: Negative for cough and shortness of breath.   Cardiovascular: Positive for palpitations. Negative for chest pain and leg swelling.  Gastrointestinal: Negative for abdominal pain, blood in stool, bowel incontinence, diarrhea, nausea and vomiting.  Genitourinary: Negative for bladder incontinence, dysuria, flank pain, hematuria and pelvic pain.  Musculoskeletal: Positive for back pain.  Skin: Negative for rash.  Neurological: Negative for tingling, sensory change, focal weakness, weakness, numbness, headaches and  paresthesias.  Endo/Heme/Allergies: Negative.   All other systems reviewed and are negative.   Vitals:   01/18/18 1338  BP: 137/67  Pulse: 70  Resp: 16  Temp: (!) 97.4 F (36.3 C)  SpO2: 97%    Physical Exam  Constitutional: He appears well-developed and well-nourished.  HENT:  Head: Normocephalic and atraumatic.  Eyes: Conjunctivae and EOM are normal. Pupils are equal, round, and reactive to light.  Neck: Normal range of motion. Neck supple. No JVD present. No thyromegaly present.  Cardiovascular: Normal rate and normal heart sounds. A regularly irregular rhythm present.  Pulmonary/Chest: Effort normal and breath sounds normal.  Abdominal: Soft. Bowel sounds are normal. He exhibits no distension.  There is no tenderness.  Musculoskeletal: Normal range of motion. He exhibits no edema or tenderness.  Lymphadenopathy:    He has no cervical adenopathy.  Neurological: He is alert. He displays normal reflexes. No cranial nerve deficit or sensory deficit. He exhibits normal muscle tone. Coordination normal.  Skin: Skin is warm and dry. Capillary refill takes less than 2 seconds. No rash noted.  Psychiatric: He has a normal mood and affect. His behavior is normal.  Vitals reviewed.    ASSESSMENT & PLAN: [name] was seen today for establish care and tailbone pain.  Diagnoses and all orders for this visit:  Sciatica of left side -     ketorolac (TORADOL) injection 60 mg -     traMADol (ULTRAM) 50 MG tablet; Take 1 tablet (50 mg total) by mouth every 8 (eight) hours as needed for up to 3 days.    Patient Instructions       IF you received an x-ray today, you will receive an invoice from [facility]. Please contact [facility] at [phone] with questions or concerns regarding your invoice.   IF you received labwork today, you will receive an invoice from [place]. Please contact [facility] at [phone] with questions or concerns regarding your invoice.    Our billing staff will not be able to assist you with questions regarding bills from these companies.  You will be contacted with the lab results as soon as they are available. The fastest way to get your results is to activate your My Chart account. Instructions are located on the last page of this paperwork. If you have not heard from [place] regarding the results in 2 weeks, please contact this office.     Sciatica Sciatica is pain, numbness, weakness, or tingling along your sciatic nerve. The sciatic nerve starts in the lower back and goes down the back of each leg. Sciatica happens when this nerve is pinched or has pressure put on it. Sciatica usually goes away on its own or with treatment. Sometimes, sciatica may keep coming back (recur). Follow these instructions at home: Medicines  Take over-the-counter and prescription medicines only as told by your doctor.  Do not drive or use heavy machinery while taking prescription pain medicine. Managing pain  If directed, put ice on the affected area. ? Put ice in a plastic bag. ? Place a towel between your skin and the bag. ? Leave the ice on for 20 minutes, 2-3 times a day.  After icing, apply heat to the affected area before you exercise or as often as told by your doctor. Use the heat source that your doctor tells you to use, such as a moist heat pack or a heating pad. ? Place a towel between your skin and the heat source. ? Leave the heat on for 20-30 minutes. ? Remove the heat if your skin turns bright red. This is especially important if you are unable to feel pain, heat, or cold. You may have a greater risk of getting burned. Activity  Return to your normal activities as told by your doctor. Ask your doctor what activities are safe for you. ? Avoid activities that make your sciatica worse.  Take short rests during the day. Rest in a lying or standing position. This is usually better than sitting to rest. ? When you rest for a long  time, do some physical activity or stretching between periods of rest. ? Avoid sitting for a long time without moving. Get up and move around  at least one time each hour.  Exercise and stretch regularly, as told by your doctor.  Do not lift anything that is heavier than 10 lb (4.5 kg) while you have symptoms of sciatica. ? Avoid lifting heavy things even when you do not have symptoms. ? Avoid lifting heavy things over and over.  When you lift objects, always lift in a way that is safe for your body. To do this, you should: ? Bend your knees. ? Keep the object close to your body. ? Avoid twisting. General instructions  Use good posture. ? Avoid leaning forward when you are sitting. ? Avoid hunching over when you are standing.  Stay at a healthy weight.  Wear comfortable shoes that support your feet. Avoid wearing high heels.  Avoid sleeping on a mattress that is too soft or too hard. You might have less pain if you sleep on a mattress that is firm enough to support your back.  Keep all follow-up visits as told by your doctor. This is important. Contact a doctor if:  You have pain that: ? Wakes you up when you are sleeping. ? Gets worse when you lie down. ? Is worse than the pain you have had in the past. ? Lasts longer than 4 weeks.  You lose weight for without trying. Get help right away if:  You cannot control when you pee (urinate) or poop (have a bowel movement).  You have weakness in any of these areas and it gets worse. ? Lower back. ? Lower belly (pelvis). ? Butt (buttocks). ? Legs.  You have redness or swelling of your back.  You have a burning feeling when you pee. This information is not intended to replace advice given to you by your health care provider. Make sure you discuss any questions you have with your health care provider. Document Released: 09/18/2008 Document Revised: 05/17/2016 Document Reviewed: 08/19/2015 Elsevier Interactive Patient Education   2018 Elsevier Inc.      Agustina Caroli, MD [facility]

## 2018-01-18 NOTE — Patient Instructions (Addendum)
IF you received an x-ray today, you will receive an invoice from [facility]. Please contact [facility] at [phone] with questions or concerns regarding your invoice.   IF you received labwork today, you will receive an invoice from [place]. Please contact [facility] at [phone] with questions or concerns regarding your invoice.   Our billing staff will not be able to assist you with questions regarding bills from these companies.  You will be contacted with the lab results as soon as they are available. The fastest way to get your results is to activate your My Chart account. Instructions are located on the last page of this paperwork. If you have not heard from [place] regarding the results in 2 weeks, please contact this office.     Sciatica Sciatica is pain, numbness, weakness, or tingling along your sciatic nerve. The sciatic nerve starts in the lower back and goes down the back of each leg. Sciatica happens when this nerve is pinched or has pressure put on it. Sciatica usually goes away on its own or with treatment. Sometimes, sciatica may keep coming back (recur). Follow these instructions at home: Medicines  Take over-the-counter and prescription medicines only as told by your doctor.  Do not drive or use heavy machinery while taking prescription pain medicine. Managing pain  If directed, put ice on the affected area. ? Put ice in a plastic bag. ? Place a towel between your skin and the bag. ? Leave the ice on for 20 minutes, 2-3 times a day.  After icing, apply heat to the affected area before you exercise or as often as told by your doctor. Use the heat source that your doctor tells you to use, such as a moist heat pack or a heating pad. ? Place a towel between your skin and the heat source. ? Leave the heat on for 20-30 minutes. ? Remove the heat if your skin turns bright red. This is especially important if you are unable to feel pain, heat, or  cold. You may have a greater risk of getting burned. Activity  Return to your normal activities as told by your doctor. Ask your doctor what activities are safe for you. ? Avoid activities that make your sciatica worse.  Take short rests during the day. Rest in a lying or standing position. This is usually better than sitting to rest. ? When you rest for a long time, do some physical activity or stretching between periods of rest. ? Avoid sitting for a long time without moving. Get up and move around at least one time each hour.  Exercise and stretch regularly, as told by your doctor.  Do not lift anything that is heavier than 10 lb (4.5 kg) while you have symptoms of sciatica. ? Avoid lifting heavy things even when you do not have symptoms. ? Avoid lifting heavy things over and over.  When you lift objects, always lift in a way that is safe for your body. To do this, you should: ? Bend your knees. ? Keep the object close to your body. ? Avoid twisting. General instructions  Use good posture. ? Avoid leaning forward when you are sitting. ? Avoid hunching over when you are standing.  Stay at a healthy weight.  Wear comfortable shoes that support your feet. Avoid wearing high heels.  Avoid sleeping on a mattress that is too soft or too hard. You might have less pain if you sleep on a mattress that is firm enough  to support your back.  Keep all follow-up visits as told by your doctor. This is important. Contact a doctor if:  You have pain that: ? Wakes you up when you are sleeping. ? Gets worse when you lie down. ? Is worse than the pain you have had in the past. ? Lasts longer than 4 weeks.  You lose weight for without trying. Get help right away if:  You cannot control when you pee (urinate) or poop (have a bowel movement).  You have weakness in any of these areas and it gets worse. ? Lower back. ? Lower belly (pelvis). ? Butt (buttocks). ? Legs.  You have redness  or swelling of your back.  You have a burning feeling when you pee. This information is not intended to replace advice given to you by your health care provider. Make sure you discuss any questions you have with your health care provider. Document Released: 09/18/2008 Document Revised: 05/17/2016 Document Reviewed: 08/19/2015 Elsevier Interactive Patient Education  [employer].

## 2018-01-27 ENCOUNTER — Telehealth: Payer: Self-pay | Admitting: Emergency Medicine

## 2018-01-27 ENCOUNTER — Other Ambulatory Visit: Payer: Self-pay | Admitting: Emergency Medicine

## 2018-01-27 DIAGNOSIS — M5430 Sciatica, unspecified side: Secondary | ICD-10-CM

## 2018-01-27 MED ORDER — TRAMADOL HCL 50 MG PO TABS: 50.0000 mg | ORAL_TABLET | Freq: Three times a day (TID) | ORAL | 0 refills | Status: DC | PRN

## 2018-01-27 NOTE — Telephone Encounter (Signed)
Done. Thanks.

## 2018-01-27 NOTE — Telephone Encounter (Signed)
Copied from [place]. Topic: General - Other >> Jan 27, 2018  9:11 AM Lolita Rieger, RMA wrote: Reason for CRM: pt called and stated that he and the doctor had spoken about  an MRI at his last appointment and he would like for that to be ordered and he also stated that he would need a refill on the pain medication that he was placed Please contact pt at [phone]

## 2018-01-30 NOTE — Telephone Encounter (Signed)
Copied from [place] #[id number]. Topic: Referral - Status >> Jan 30, 2018  9:08 AM Synthia Innocent wrote: Patient checking status of MRI, also if neurosurgeon is needed after MRI, he would like to be referred to Dr [name] w/ [facility]. Please advise

## 2018-01-30 NOTE — Telephone Encounter (Signed)
Copied from [place] #[id number]. Topic: Referral - Status >> Jan 29, 2018  5:54 PM Xavier Bell B wrote: Reason for CRM: pt asking to check status of mri, pt states he's in pain and seeing if anything can be done quicker, pt has been explained the referral process

## 2018-01-30 NOTE — Telephone Encounter (Signed)
Copied from [place] #[id number]. Topic: Referral - Status >> Jan 29, 2018  5:54 PM Xavier Bell wrote: Reason for CRM: pt asking to check status of mri, pt states he's in pain and seeing if anything can be done quicker, pt has been explained the referral process  >> Jan 30, 2018  9:08 AM Xavier Bell wrote: Patient checking status of MRI, also if neurosurgeon is needed after MRI, he would like to be referred to Dr [name] w/ [facility]. Please advise >> Jan 30, 2018  9:54 AM Xavier Bell, Xavier Bell wrote: Pt. Calling back stated that his insurance does not require prior auth. To have an mri done. Pt. Stated that [place] said [name] would be able to perform mri. Pt would like for someone to give him a call back at [phone]

## 2018-01-31 NOTE — Telephone Encounter (Signed)
Pt's MRI has been scheduled. Spoke with pt to let him know we were sending referral to Dr. Annette Stable at [facility]. Pt stated he was unsure if he wants to go to Dr. Annette Stable or Dr. [name]. He is wanting to know the difference between an orthopaedic doctor and a neurosurgeon. I told pt I would put a message in to see if someone could discuss this with him. I am holding the referral for Neurosurgery until pt let's [place] know what he would like to do. If he decides on ortho, we can send that back to [facility] and the pt will need to call them to have them schedule him. He was also wondering who will read the MRI once done? Pt stated he will let us know who he would like to see after he can speak with someone to find out the difference.

## 2018-01-31 NOTE — Telephone Encounter (Signed)
Spoke with pt.  He willcheck with POA to see if [name] does spinal surgery and make a decision following the MRI.

## 2018-01-31 NOTE — Telephone Encounter (Signed)
Spoke with referrals - Deneise Lever - She will change from Ortho referral to Neuro Referral - Dr. [name] per patients request. Deneise Lever will double check on Prior Auth and if none needed as pt stated, she will call pt to advise he can schedule at [facility] at his convenience.

## 2018-02-03 NOTE — Telephone Encounter (Signed)
Pt called in stating MRI lumbar spine is scheduled for 2/12 7pm   Pending results pt would like referral to [facility] [place] [facility] in [place], ph# [phone], Dr. Laurence Spates

## 2018-02-04 ENCOUNTER — Ambulatory Visit (HOSPITAL_COMMUNITY)
Admission: RE | Admit: 2018-02-04 | Discharge: 2018-02-04 | Disposition: A | Discharge status: Home / Self Care | Payer: No Typology Code available for payment source | Source: Ambulatory Visit | Attending: Emergency Medicine | Admitting: Emergency Medicine

## 2018-02-04 DIAGNOSIS — R937 Abnormal findings on diagnostic imaging of other parts of musculoskeletal system: Secondary | ICD-10-CM | POA: Insufficient documentation

## 2018-02-04 DIAGNOSIS — X58XXXA Exposure to other specified factors, initial encounter: Secondary | ICD-10-CM | POA: Insufficient documentation

## 2018-02-04 DIAGNOSIS — M419 Scoliosis, unspecified: Secondary | ICD-10-CM | POA: Diagnosis not present

## 2018-02-04 DIAGNOSIS — S39012A Strain of muscle, fascia and tendon of lower back, initial encounter: Secondary | ICD-10-CM | POA: Insufficient documentation

## 2018-02-04 DIAGNOSIS — M48061 Spinal stenosis, lumbar region without neurogenic claudication: Secondary | ICD-10-CM | POA: Diagnosis not present

## 2018-02-04 DIAGNOSIS — M5430 Sciatica, unspecified side: Secondary | ICD-10-CM | POA: Insufficient documentation

## 2018-02-05 ENCOUNTER — Other Ambulatory Visit: Payer: Self-pay | Admitting: Emergency Medicine

## 2018-02-05 DIAGNOSIS — M5386 Other specified dorsopathies, lumbar region: Secondary | ICD-10-CM

## 2018-02-05 NOTE — Telephone Encounter (Signed)
Spoke with pt this Morning about MRI and informed him referral has been place and they should be giving him a call. He verbalized understanding.

## 2018-02-05 NOTE — Telephone Encounter (Signed)
Copied from [place] [id number]. Topic: Referral - Status >> Feb 05, 2018  3:26 PM Xavier Bell wrote: Xavier Bell from [facility] called and said that she reached out to the patient & Offered him an appt, he would rather see an orthopedic dr instead. He declined the referral  Call back is [phone]  >> Feb 05, 2018  6:35 PM Xavier Bell wrote: Pt needing to know where the MRI results were sent / and or doctor's office it was sent to.   Dr Marchelle Folks office called to make an appt. He couldn't make the appt because he is needing to know results before he makes an appt.    Pt is feeling in the dark about this process.  Pt is needing someone to call and discuss the above with him as soon as possible or in the morning.   Pt contact number [phone]

## 2018-02-06 NOTE — Telephone Encounter (Signed)
Spoke to pt.  He is member of [employer] [facility] network out of [place] of [place] [phone] get Administrator, Civil Service - they listed a group of [place] [facility] surgeons. Returned call to pt - he will call [facility] to see which of list specializes in spinal surgery. Advised he can call the same number above and ask for neurosurgery participants in the network also. Looks like they only accept neurosurgeons from [employer] and [place], [place].

## 2018-02-06 NOTE — Telephone Encounter (Signed)
Called pt and he stated that he is going to look into [facility] before any decision is made of where he is going to go for ortho. [facility] is not in his network but [facility] is.. Pt will call back after he talks to [facility]

## 2018-02-07 ENCOUNTER — Telehealth: Payer: Self-pay | Admitting: General Practice

## 2018-02-07 NOTE — Telephone Encounter (Signed)
Copied from [place] [id number]. Topic: Quick Communication - See Telephone Encounter >> Feb 07, 2018  3:59 PM Arletha Grippe wrote: CRM for notification. See Telephone encounter for:   02/07/18. Pt has selected Dr Ardyth Gal for his surgeon with [facility] [address].  He has appt on Thursday.  No cb needed - [phone] if you need to call pt back

## 2018-02-07 NOTE — Telephone Encounter (Signed)
Closed in error, need to route to [place]

## 2018-02-08 ENCOUNTER — Other Ambulatory Visit: Payer: Self-pay | Admitting: Cardiovascular Disease

## 2018-02-08 NOTE — Telephone Encounter (Signed)
Note sent to referrals FYI

## 2018-02-10 NOTE — Telephone Encounter (Signed)
Referral sent to [place] for Dr. Tonita Cong on 2/18

## 2018-02-13 DIAGNOSIS — M545 Low back pain, unspecified: Secondary | ICD-10-CM | POA: Insufficient documentation

## 2018-02-20 DIAGNOSIS — M5126 Other intervertebral disc displacement, lumbar region: Secondary | ICD-10-CM | POA: Insufficient documentation

## 2018-02-22 ENCOUNTER — Other Ambulatory Visit: Payer: Self-pay | Admitting: Cardiovascular Disease

## 2018-03-07 ENCOUNTER — Other Ambulatory Visit: Payer: Self-pay | Admitting: Cardiovascular Disease

## 2018-04-09 ENCOUNTER — Encounter: Payer: Self-pay | Admitting: Cardiovascular Disease

## 2018-04-16 ENCOUNTER — Other Ambulatory Visit: Payer: Self-pay | Admitting: Cardiovascular Disease

## 2018-04-16 DIAGNOSIS — I779 Disorder of arteries and arterioles, unspecified: Secondary | ICD-10-CM

## 2018-04-16 DIAGNOSIS — I739 Peripheral vascular disease, unspecified: Principal | ICD-10-CM

## 2018-04-18 ENCOUNTER — Ambulatory Visit (HOSPITAL_COMMUNITY)
Admission: RE | Admit: 2018-04-18 | Discharge: 2018-04-18 | Disposition: A | Discharge status: Home / Self Care | Payer: No Typology Code available for payment source | Source: Ambulatory Visit | Attending: Internal Medicine | Admitting: Internal Medicine

## 2018-04-18 DIAGNOSIS — I779 Disorder of arteries and arterioles, unspecified: Secondary | ICD-10-CM

## 2018-04-18 DIAGNOSIS — E785 Hyperlipidemia, unspecified: Secondary | ICD-10-CM | POA: Insufficient documentation

## 2018-04-18 DIAGNOSIS — I10 Essential (primary) hypertension: Secondary | ICD-10-CM | POA: Diagnosis not present

## 2018-04-18 DIAGNOSIS — I6523 Occlusion and stenosis of bilateral carotid arteries: Secondary | ICD-10-CM | POA: Diagnosis not present

## 2018-04-18 DIAGNOSIS — I2510 Atherosclerotic heart disease of native coronary artery without angina pectoris: Secondary | ICD-10-CM | POA: Insufficient documentation

## 2018-04-18 DIAGNOSIS — I739 Peripheral vascular disease, unspecified: Secondary | ICD-10-CM

## 2018-04-21 ENCOUNTER — Encounter: Payer: Self-pay | Admitting: Cardiovascular Disease

## 2018-04-21 ENCOUNTER — Ambulatory Visit: Payer: PRIVATE HEALTH INSURANCE | Admitting: Cardiovascular Disease

## 2018-04-21 VITALS — BP 148/62 | HR 54 | Ht 67.0 in | Wt 163.5 lb

## 2018-04-21 DIAGNOSIS — I2510 Atherosclerotic heart disease of native coronary artery without angina pectoris: Secondary | ICD-10-CM

## 2018-04-21 DIAGNOSIS — I6523 Occlusion and stenosis of bilateral carotid arteries: Secondary | ICD-10-CM | POA: Diagnosis not present

## 2018-04-21 DIAGNOSIS — E782 Mixed hyperlipidemia: Secondary | ICD-10-CM

## 2018-04-21 LAB — HEPATIC FUNCTION PANEL
ALT: 22 IU/L (ref 0–44)
AST: 26 IU/L (ref 0–40)
Albumin: 4.6 g/dL (ref 3.5–4.8)
Alkaline Phosphatase: 99 IU/L (ref 39–117)
Bilirubin Total: 0.4 mg/dL (ref 0.0–1.2)
Bilirubin, Direct: 0.17 mg/dL (ref 0.00–0.40)
Total Protein: 6.9 g/dL (ref 6.0–8.5)

## 2018-04-21 LAB — BASIC METABOLIC PANEL
BUN/Creatinine Ratio: 21 (ref 10–24)
BUN: 25 mg/dL (ref 8–27)
CALCIUM: 10 mg/dL (ref 8.6–10.2)
CHLORIDE: 105 mmol/L (ref 96–106)
CO2: 21 mmol/L (ref 20–29)
Creatinine, Ser: 1.19 mg/dL (ref 0.76–1.27)
GFR calc Af Amer: 67 mL/min/{1.73_m2} (ref >59)
GFR, EST NON AFRICAN AMERICAN: 58 mL/min/{1.73_m2} — AB (ref >59)
GLUCOSE: 96 mg/dL (ref 65–99)
POTASSIUM: 4.3 mmol/L (ref 3.5–5.2)
SODIUM: 141 mmol/L (ref 134–144)

## 2018-04-21 LAB — LIPID PANEL
Chol/HDL Ratio: 3.4 ratio (ref 0.0–5.0)
Cholesterol, Total: 110 mg/dL (ref 100–199)
HDL: 32 mg/dL — AB (ref >39)
LDL Calculated: 54 mg/dL (ref 0–99)
TRIGLYCERIDES: 120 mg/dL (ref 0–149)
VLDL CHOLESTEROL CAL: 24 mg/dL (ref 5–40)

## 2018-04-21 NOTE — Progress Notes (Signed)
Cardiology Office Note   Date:  04/21/2018   ID:  Xavier Bell, Xavier Bell 1941/01/16, MRN [id number]  PCP:  Horald Pollen, MD  Cardiologist:   Mertie Moores, MD   No chief complaint on file.  1. Coronary artery disease-status post CABG 2. Atrial flutter- s/p cardioversion 02/08/12 3. Hypertension 4. Right carotid bruit  History of Present Illness:  Has a history of atrial flutter this past spring. He status post cardioversion. [name] done very well. He continues to take Xarelto. [name] not been exercising nearly as much as he needs to. [name] also been careless with his diet and has picked up a few pounds.  March 06, 2013:  He is doing well. No angina. He is continuing to have foot problems - especially of the left foot. He has intense pain in his heel when he walks.  Sept. 12, 2014:  He has done well. Still not exercising as much. Takes motrin at night. He has left plantar faciatis.   March 19, 2014:  Xavier Bell is doing ok No CP or dyspnea. No palpitations. He has gone back into atrial flutter. He cannot tell that his HR is irreg.   He has a hx of a-flutter in the past ( 2014) and was treated with Xarelto and was cardioverted.   May 09, 2015 Xavier Bell Xavier Bell is a 78 y.o. male who presents for follow up of his atrial flutter , HTN, CAD No CP , not exercising much .   Nov. 14, 2016: Doing well. BP is a bit high. Not much exercise ,  Sore feet.   May 28, 2016:  Doing well.  BP is normal  No CP , no dyspnea at rest.   Slight DOE with activity. Not exercising   Jan. 8, 2018:  Xavier Bell is seen today .   Doing well.   Not exercising much .   Knows that he needs to.  Aug. 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  No CP , no dyspnea   April 21, 2018:  Xavier Bell is seen back today for follow-up of his coronary artery disease and mild to moderate carotid artery disease.  Recent carotid duplex scan reveals moderate left internal carotid plaque and mild right  internal carotid disease.  Hx of hyperlipidemia ,    Had some back issues  - has 3 bulging discs, 1 was herniated  Back has improved some with conservative management .   Does not think he will need surgery any time soon.   No CP or dyspnea     Past Medical History:  Diagnosis Date  . Atrial flutter ([place])    s/p cardioversion 02/08/12; ablation by [name] [phone]  . Chronic anticoagulation   . Coronary artery disease    s/p remote MI in 1994 with multiple PCI and subsequent CABG x 5 in 1996  . Dyslipidemia   . Hypertension   . MI (myocardial infarction) ([place]) 1995  . Normal nuclear stress test 2006  . Pseudoaphakia    both eyes  . Retinal detachment     Past Surgical History:  Procedure Laterality Date  . ATRIAL FLUTTER ABLATION N/A 12/08/2014   CTI by [name]  . CARDIAC CATHETERIZATION  1996  . CARDIOVERSION  02/08/2012   Procedure: CARDIOVERSION;  Surgeon: Darden Amber., MD;  Location: [facility] OR;  Service: Cardiovascular;  Laterality: N/A;  . CORONARY ARTERY BYPASS GRAFT  1996   LIMA to LAD, SVG to RV marginal & PD, SVG  to DX and SVG to Ramus  . TONSILLECTOMY AND ADENOIDECTOMY       Current Outpatient Medications  Medication Sig Dispense Refill  . amLODipine (NORVASC) 10 MG tablet Take 1 tablet (10 mg total) by mouth daily. 90 tablet 1  . aspirin 81 MG tablet Take 81 mg by mouth daily.    [name] atorvastatin (LIPITOR) 40 MG tablet Take 1 tablet (40 mg total) daily by mouth. 90 tablet 2  . carvedilol (COREG) 25 MG tablet TAKE 1 TABLET TWICE A DAY 180 tablet 0  . Cholecalciferol (VITAMIN D) 2000 units tablet Take 2,000 Units by mouth daily.    . dorzolamide-timolol (COSOPT) 22.3-6.8 MG/ML ophthalmic solution Place 2 drops into both eyes 2 (two) times daily.    . fenofibrate (TRICOR) 145 MG tablet TAKE 1 TABLET DAILY 90 tablet 1  . hydrochlorothiazide (HYDRODIURIL) 25 MG tablet TAKE 1 TABLET DAILY 90 tablet 1  . latanoprost (XALATAN) 0.005 % ophthalmic solution Place 1  drop into the right eye at bedtime.    [name] losartan (COZAAR) 100 MG tablet TAKE 1 TABLET DAILY 90 tablet 2  . Multiple Vitamin (MULTIVITAMIN) tablet Take 1 tablet by mouth daily.      . Omega-3 Fatty Acids (OMEGA 3 PO) Take 1 capsule by mouth daily.    . potassium chloride SA (K-DUR,KLOR-CON) 20 MEQ tablet TAKE 1 TABLET DAILY 90 tablet 3  . traMADol (ULTRAM) 50 MG tablet Take 1 tablet (50 mg total) by mouth every 8 (eight) hours as needed. 20 tablet 0  . Zinc 50 MG CAPS Take 50 mg by mouth daily.      No current facility-administered medications for this visit.     Allergies:   Brimonidine and Niacin and related    Social History:  The patient  reports that he has never smoked. He has never used smokeless tobacco. He reports that he does not drink alcohol or use drugs.   Family History:  The patient's family history includes Coronary artery disease in his unknown relative; Heart disease in his father and mother.    ROS:  Noted in current hx , otherwise , ROS is negative    Physical Exam: Blood pressure (!) 148/62, pulse (!) 54, height 5\' 7"  (1.702 m), weight 163 lb 8 oz (74.2 kg).  GEN:  Well nourished, well developed in no acute distress HEENT: Normal NECK: No JVD;   LYMPHATICS: No lymphadenopathy CARDIAC: RRR  RESPIRATORY:  Clear to auscultation without rales, wheezing or rhonchi  ABDOMEN: Soft, non-tender, non-distended MUSCULOSKELETAL:  No edema; No deformity  SKIN: Warm and dry NEUROLOGIC:  Alert and oriented x 3   EKG:  April 21, 2018:   Sinus bradycardia with first-degree AV block.  Heart rate is 53.  Nonspecific IVCD.  Recent Labs: 08/09/2017: ALT 24; BUN 26; Creatinine, Ser 1.36; Hemoglobin 12.4; Platelets 296; Potassium 3.9; Sodium 144    Lipid Panel    Component Value Date/Time   CHOL 121 08/09/2017 1630   TRIG 141 08/09/2017 1630   HDL 32 (L) 08/09/2017 1630   CHOLHDL 3.8 08/09/2017 1630   CHOLHDL 2.7 05/28/2016 0816   VLDL 16 05/28/2016 0816   LDLCALC 61  08/09/2017 1630      Wt Readings from Last 3 Encounters:  04/21/18 163 lb 8 oz (74.2 kg)  02/04/18 155 lb (70.3 kg)  01/18/18 155 lb 3.2 oz (70.4 kg)      Other studies Reviewed: Additional studies/ records that were reviewed today include: . Review of  the above records demonstrates:    ASSESSMENT AND PLAN:  1. Coronary artery disease-status post CABG -doing very well.  No angina.  2. Atrial flutter-he is maintaining normal sinus rhythm.  3. Hypertension -  BP is well controlled.   4. Right carotid bruit-   Has mild R ICA diseasese,  Moderate left carotid disease.   5. Hyperlipidemia:   Check labs today    Current medicines are reviewed at length with the patient today.  The patient does not have concerns regarding medicines.  The following changes have been made:  no change  Labs/ tests ordered today include:   Orders Placed This Encounter  Procedures  . Basic Metabolic Panel (BMET)  . Hepatic function panel  . Lipid Profile  . EKG 12-Lead     Disposition:   FU with me in 6 months .   Will get fasting labs today .      Mertie Moores, MD  04/21/2018 8:37 AM    [facility] [place], [place], [place]  [address] Phone: [phone]; Fax: [phone]

## 2018-04-21 NOTE — Patient Instructions (Signed)
Medication Instructions:  Your physician recommends that you continue on your current medications as directed. Please refer to the Current Medication list given to you today.   Labwork: TODAY - cholesterol, liver panel, basic metabolic panel   Testing/Procedures: None Ordered   Follow-Up: Your physician wants you to follow-up in: 6 months with Richardson Dopp, PA. You will receive a reminder letter in the mail two months in advance. If you don't receive a letter, please call our office to schedule the follow-up appointment.   If you need a refill on your cardiac medications before your next appointment, please call your pharmacy.   Thank you for choosing [facility]! Christen Bame, RN [phone]

## 2018-05-23 ENCOUNTER — Other Ambulatory Visit: Payer: Self-pay | Admitting: Nurse Practitioner

## 2018-05-23 DIAGNOSIS — I739 Peripheral vascular disease, unspecified: Principal | ICD-10-CM

## 2018-05-23 DIAGNOSIS — I779 Disorder of arteries and arterioles, unspecified: Secondary | ICD-10-CM

## 2018-06-07 ENCOUNTER — Other Ambulatory Visit: Payer: Self-pay | Admitting: Cardiovascular Disease

## 2018-07-26 ENCOUNTER — Other Ambulatory Visit: Payer: Self-pay | Admitting: Cardiovascular Disease

## 2018-08-16 ENCOUNTER — Other Ambulatory Visit: Payer: Self-pay | Admitting: Cardiovascular Disease

## 2018-08-20 ENCOUNTER — Other Ambulatory Visit: Payer: Self-pay | Admitting: Cardiovascular Disease

## 2018-09-30 ENCOUNTER — Other Ambulatory Visit: Payer: Self-pay | Admitting: Cardiovascular Disease

## 2018-10-14 ENCOUNTER — Encounter: Payer: Self-pay | Admitting: Cardiovascular Disease

## 2018-11-03 ENCOUNTER — Ambulatory Visit: Payer: PRIVATE HEALTH INSURANCE | Admitting: Cardiovascular Disease

## 2018-11-03 ENCOUNTER — Encounter (INDEPENDENT_AMBULATORY_CARE_PROVIDER_SITE_OTHER): Payer: Self-pay

## 2018-11-03 ENCOUNTER — Encounter: Payer: Self-pay | Admitting: Cardiovascular Disease

## 2018-11-03 VITALS — BP 140/78 | HR 63 | Ht 67.0 in | Wt 160.4 lb

## 2018-11-03 DIAGNOSIS — I6523 Occlusion and stenosis of bilateral carotid arteries: Secondary | ICD-10-CM | POA: Diagnosis not present

## 2018-11-03 DIAGNOSIS — I2510 Atherosclerotic heart disease of native coronary artery without angina pectoris: Secondary | ICD-10-CM | POA: Diagnosis not present

## 2018-11-03 DIAGNOSIS — E782 Mixed hyperlipidemia: Secondary | ICD-10-CM

## 2018-11-03 LAB — HEPATIC FUNCTION PANEL
ALBUMIN: 4.7 g/dL (ref 3.5–4.8)
ALK PHOS: 78 IU/L (ref 39–117)
ALT: 21 IU/L (ref 0–44)
AST: 29 IU/L (ref 0–40)
BILIRUBIN TOTAL: 0.6 mg/dL (ref 0.0–1.2)
Bilirubin, Direct: 0.22 mg/dL (ref 0.00–0.40)
Total Protein: 6.8 g/dL (ref 6.0–8.5)

## 2018-11-03 LAB — BASIC METABOLIC PANEL
BUN/Creatinine Ratio: 20 (ref 10–24)
BUN: 29 mg/dL — AB (ref 8–27)
CALCIUM: 10.1 mg/dL (ref 8.6–10.2)
CO2: 21 mmol/L (ref 20–29)
CREATININE: 1.42 mg/dL — AB (ref 0.76–1.27)
Chloride: 107 mmol/L — ABNORMAL HIGH (ref 96–106)
GFR calc Af Amer: 54 mL/min/{1.73_m2} — ABNORMAL LOW (ref >59)
GFR calc non Af Amer: 47 mL/min/{1.73_m2} — ABNORMAL LOW (ref >59)
Glucose: 105 mg/dL — ABNORMAL HIGH (ref 65–99)
Potassium: 4.4 mmol/L (ref 3.5–5.2)
Sodium: 143 mmol/L (ref 134–144)

## 2018-11-03 LAB — LIPID PANEL
CHOLESTEROL TOTAL: 111 mg/dL (ref 100–199)
Chol/HDL Ratio: 3.3 ratio (ref 0.0–5.0)
HDL: 34 mg/dL — ABNORMAL LOW (ref >39)
LDL CALC: 58 mg/dL (ref 0–99)
Triglycerides: 96 mg/dL (ref 0–149)
VLDL Cholesterol Cal: 19 mg/dL (ref 5–40)

## 2018-11-03 NOTE — Progress Notes (Signed)
Cardiology Office Note   Date:  11/03/2018   ID:  Juanpablo, Ciresi February 16, 1941, MRN [id number]  PCP:  Horald Pollen, MD  Cardiologist:   Mertie Moores, MD   Chief Complaint  Patient presents with  . Coronary Artery Disease   1. Coronary artery disease-status post CABG 2. Atrial flutter- s/p cardioversion 02/08/12 3. Hypertension 4. Right carotid bruit     Has a history of atrial flutter this past spring. He status post cardioversion. [name] done very well. He continues to take Xarelto. [name] not been exercising nearly as much as he needs to. [name] also been careless with his diet and has picked up a few pounds.  March 06, 2013:  He is doing well. No angina. He is continuing to have foot problems - especially of the left foot. He has intense pain in his heel when he walks.  Sept. 12, 2014:  He has done well. Still not exercising as much. Takes motrin at night. He has left plantar faciatis.   March 19, 2014:  [name] is doing ok No CP or dyspnea. No palpitations. He has gone back into atrial flutter. He cannot tell that his HR is irreg.   He has a hx of a-flutter in the past ( 2014) and was treated with Xarelto and was cardioverted.   May 09, 2015 Merrik Puebla Clinger is a 77 y.o. male who presents for follow up of his atrial flutter , HTN, CAD No CP , not exercising much .   Nov. 14, 2016: Doing well. BP is a bit high. Not much exercise ,  Sore feet.   May 28, 2016:  Doing well.  BP is normal  No CP , no dyspnea at rest.   Slight DOE with activity. Not exercising   Jan. 8, 2018:  [name] is seen today .   Doing well.   Not exercising much .   Knows that he needs to.  Aug. 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  No CP , no dyspnea   April 21, 2018:  [name] is seen back today for follow-up of his coronary artery disease and mild to moderate carotid artery disease.  Recent carotid duplex scan reveals moderate left internal carotid plaque  and mild right internal carotid disease.  Hx of hyperlipidemia ,    Had some back issues  - has 3 bulging discs, 1 was herniated  Back has improved some with conservative management .   Does not think he will need surgery any time soon.   No CP or dyspnea   November 03, 2018: She is seen today for follow-up of his coronary artery disease and moderate carotid artery disease.  Is a moderate left internal carotid plaque.  He has mild right internal carotid disease.  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 Atrial flutter ([place])    s/p cardioversion 02/08/12; ablation by [name] [phone]  . Chronic anticoagulation   . Coronary artery disease    s/p remote MI in 1994 with multiple PCI and subsequent CABG x 5 in 1996  . Dyslipidemia   . Hypertension   . MI (myocardial infarction) ([place]) 1995  . Normal nuclear stress test 2006  . Pseudoaphakia    both eyes  . Retinal detachment     Past Surgical History:  Procedure Laterality Date  . ATRIAL FLUTTER ABLATION N/A 12/08/2014   CTI by [name]  . CARDIAC CATHETERIZATION  1996  . CARDIOVERSION  02/08/2012   Procedure: CARDIOVERSION;  Surgeon: Darden Amber., MD;  Location: [facility] OR;  Service: Cardiovascular;  Laterality: N/A;  . CORONARY ARTERY BYPASS GRAFT  1996   LIMA to LAD, SVG to RV marginal & PD, SVG  to DX and SVG to Ramus  . TONSILLECTOMY AND ADENOIDECTOMY       Current Outpatient Medications  Medication Sig Dispense Refill  . amLODipine (NORVASC) 10 MG tablet Take 1 tablet by mouth daily.    [name] aspirin 81 MG tablet Take 81 mg by mouth daily.    [name] atorvastatin (LIPITOR) 40 MG tablet Take 1 tablet by mouth daily.    . carvedilol (COREG) 25 MG tablet Take 1 tablet (25 mg total) by mouth 2 (two) times daily. 180 tablet 2  . Cholecalciferol (VITAMIN D) 2000 units tablet Take 2,000 Units by mouth daily.     . dorzolamide-timolol (COSOPT) 22.3-6.8 MG/ML ophthalmic solution Place 2 drops into both eyes 2 (two) times daily.    . fenofibrate (TRICOR) 145 MG tablet Take 1 tablet (145 mg total) by mouth daily. 90 tablet 2  . hydrochlorothiazide (HYDRODIURIL) 25 MG tablet Take 1 tablet by mouth daily.    [name] latanoprost (XALATAN) 0.005 % ophthalmic solution Place 1 drop into the right eye at bedtime.    [name] losartan (COZAAR) 100 MG tablet Take 1 tablet by mouth daily.    . Multiple Vitamin (MULTIVITAMIN) tablet Take 1 tablet by mouth daily.      . Omega-3 Fatty Acids (OMEGA 3 PO) Take 1 capsule by mouth daily.    . potassium chloride SA (KLOR-CON M20) 20 MEQ tablet Take 1 tablet by mouth daily.    . traMADol (ULTRAM) 50 MG tablet Take 1 tablet (50 mg total) by mouth every 8 (eight) hours as needed. 20 tablet 0  . Zinc 50 MG CAPS Take 50 mg by mouth daily.      No current facility-administered medications for this visit.     Allergies:   Brimonidine and Niacin and related    Social History:  The patient  reports that he has never smoked. He has never used smokeless tobacco. He reports that he does not drink alcohol or use drugs.   Family History:  The patient's family history includes Coronary artery disease in his unknown relative; Heart disease in his father and mother.    ROS:  Noted in current hx , otherwise , ROS is negative   Physical Exam: Blood pressure 140/78, pulse 63, height 5\' 7"  (1.702 m), weight 160 lb 6.4 oz (72.8 kg), SpO2 95 %.  GEN:    Well developed , elderly man   HEENT: Normal NECK: No JVD; No carotid bruits LYMPHATICS: No lymphadenopathy CARDIAC: RRR, no murmurs, rubs, gallops RESPIRATORY:  Clear to auscultation without rales, wheezing or rhonchi  ABDOMEN: Soft, non-tender, non-distended MUSCULOSKELETAL:  No edema; No deformity  SKIN: Warm and dry NEUROLOGIC:  Alert and oriented x 3   EKG:     Recent Labs: 11/03/2018: ALT 21; BUN 29; Creatinine, Ser 1.42;  Potassium 4.4; Sodium 143    Lipid Panel    Component Value Date/Time   CHOL 111 11/03/2018 0855   TRIG 96 11/03/2018 0855   HDL 34 (L) 11/03/2018 0855   CHOLHDL 3.3 11/03/2018 0855   CHOLHDL 2.7 05/28/2016 0816   VLDL 16 05/28/2016  [place] 11/03/2018 0855      Wt Readings from Last 3 Encounters:  11/03/18 160 lb 6.4 oz (72.8 kg)  04/21/18 163 lb 8 oz (74.2 kg)  02/04/18 155 lb (70.3 kg)      Other studies Reviewed: Additional studies/ records that were reviewed today include: . Review of the above records demonstrates:    ASSESSMENT AND PLAN:  1. Coronary artery disease-   No angina  Overall doing well   2. Atrial flutter  - stable   3. Hypertensive heart disease without CHF -    BP is a bit elevated.   He has been eating more salt than he should   4. Right carotid bruit-    Recheck carotid buplex in 6 months   5. Hyperlipidemia:    Check lipids today .   Cont. atorva    Current medicines are reviewed at length with the patient today.  The patient does not have concerns regarding medicines.  The following changes have been made:  no change  Labs/ tests ordered today include:   Orders Placed This Encounter  Procedures  . Lipid Profile  . Basic Metabolic Panel (BMET)  . Hepatic function panel     Disposition:   FU with me in 6 months .   Will get fasting labs today .      Mertie Moores, MD  11/03/2018 5:27 PM    [facility] [place], [place], [place]  [address] Phone: [phone]; Fax: [phone]

## 2018-11-03 NOTE — Patient Instructions (Signed)
Medication Instructions:  Your physician recommends that you continue on your current medications as directed. Please refer to the Current Medication list given to you today.  If you need a refill on your cardiac medications before your next appointment, please call your pharmacy.    Lab work: TODAY - cholesterol, liver panel, basic metabolic panel  If you have labs (blood work) drawn today and your tests are completely normal, you will receive your results only by: [name] MyChart Message (if you have MyChart) OR . A paper copy in the mail If you have any lab test that is abnormal or we need to change your treatment, we will call you to review the results.  Testing/Procedures: Your physician has requested that you have a carotid duplex in 6 months. This test is an ultrasound of the carotid arteries in your neck. It looks at blood flow through these arteries that supply the brain with blood. Allow one hour for this exam. There are no restrictions or special instructions.    Follow-Up: At [facility], you and your health needs are our priority.  As part of our continuing mission to provide you with exceptional heart care, we have created designated Provider Care Teams.  These Care Teams include your primary Cardiologist (physician) and Advanced Practice Providers (APPs -  Physician Assistants and Nurse Practitioners) who all work together to provide you with the care you need, when you need it. You will need a follow up appointment in:  6 months.  Please call our office 2 months in advance to schedule this appointment.  You may see Mertie Moores, MD or one of the following Advanced Practice Providers on your designated Care Team: Richardson Dopp, PA-C [place], [place] . Daune Perch, NP

## 2018-12-14 ENCOUNTER — Other Ambulatory Visit: Payer: Self-pay | Admitting: Cardiovascular Disease

## 2019-01-01 IMAGING — MR MR LUMBAR SPINE W/O CM
4 of 5 series · 17 of 48 positions shown · non-contrast
Comparison: None.

CLINICAL DATA: Persistent radiculopathy.

EXAM:
MRI LUMBAR SPINE WITHOUT CONTRAST
TECHNIQUE: Multiplanar, multisequence MR imaging of the lumbar spine was
performed. No intravenous contrast was administered.

[Series 3: T1 · sagittal · 4.0mm · 0.47mm/px · 3 of 17 slices shown (1 of 2)]
[im 4/17]
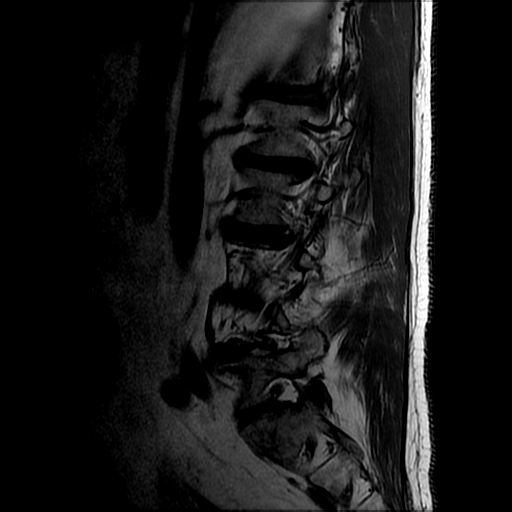
[im 10/17]
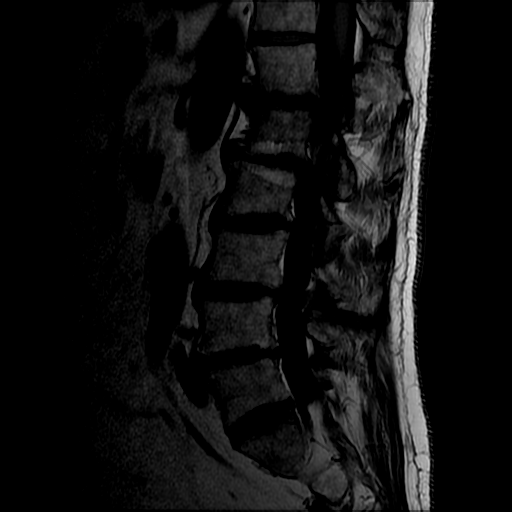
[im 17/17]
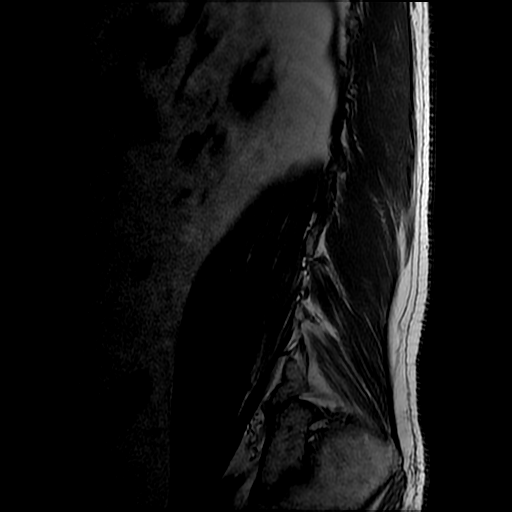

[Series 4: T2 · sagittal · 4.0mm · 0.47mm/px · 7 of 17 slices shown (1 of 2)]
[im 1/17]
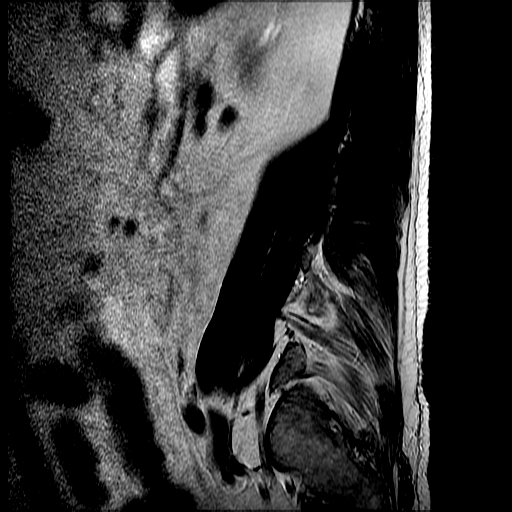
[im 3/17]
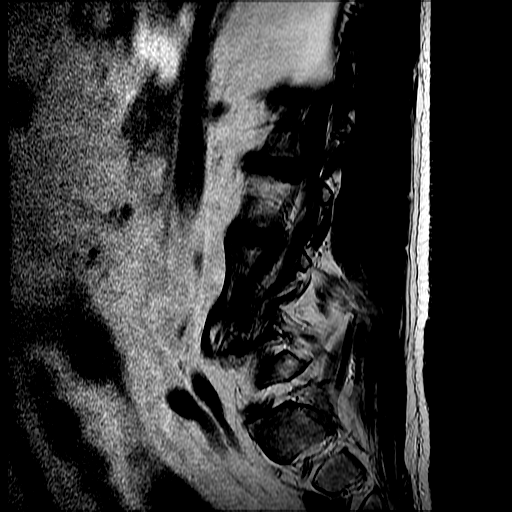
[im 6/17]
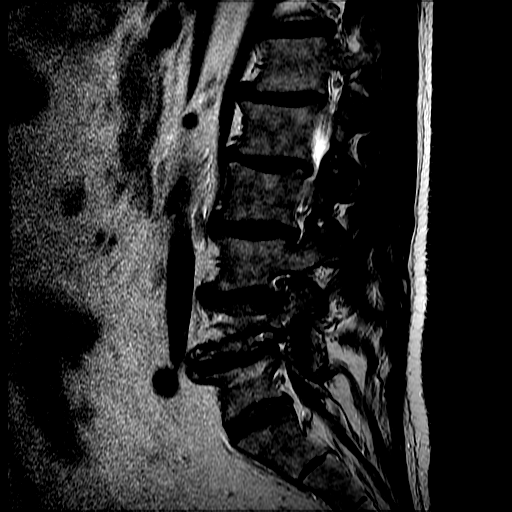
[im 9/17]
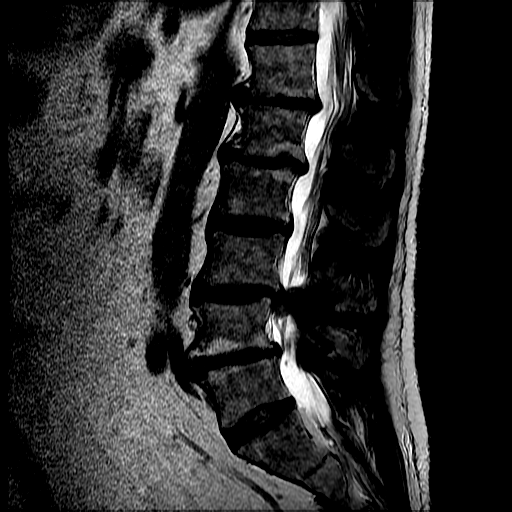
[im 11/17]
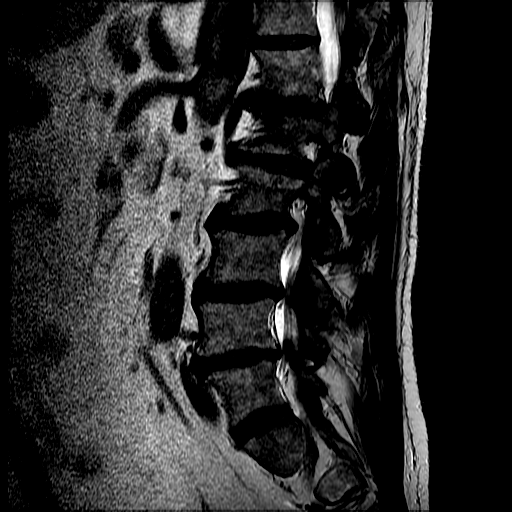
[im 14/17]
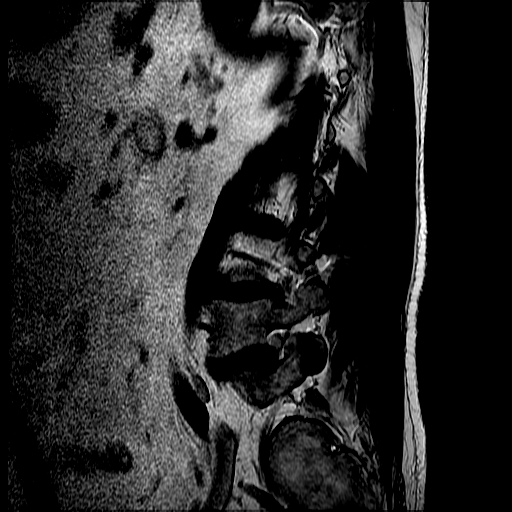
[im 17/17]
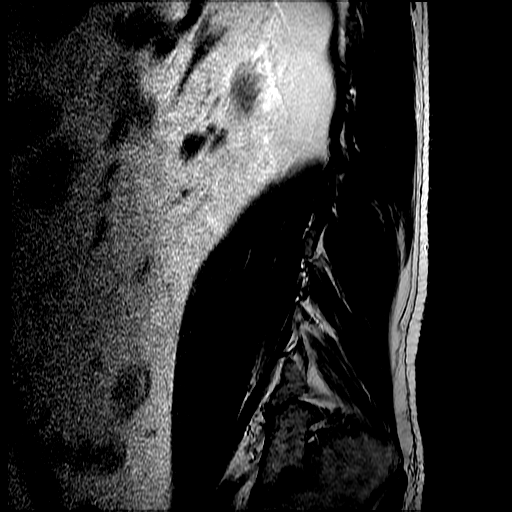

[Series 6: T2 · axial · 4.0mm · 0.39mm/px · z∈[-116,+40]mm · 4 of 35 slices shown (2 of 2)]
[im 1/35]
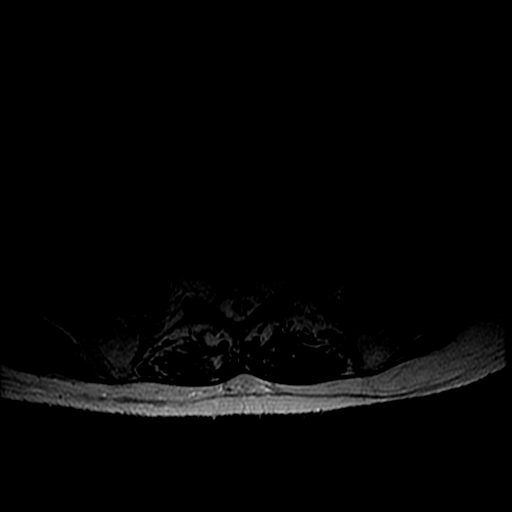
[im 6/35]
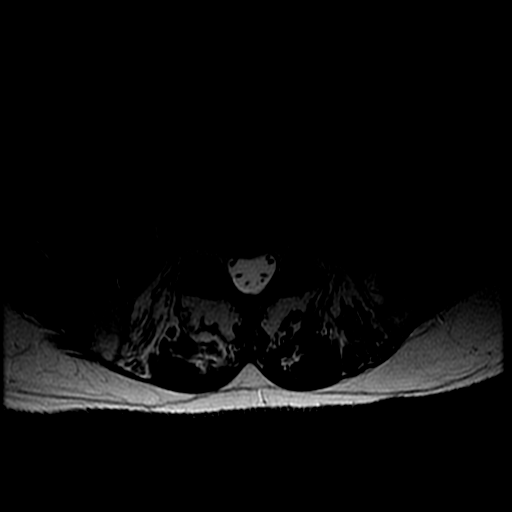
[im 19/35]
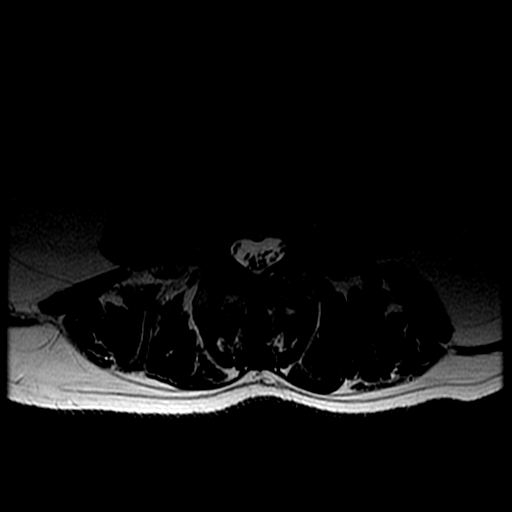
[im 29/35]
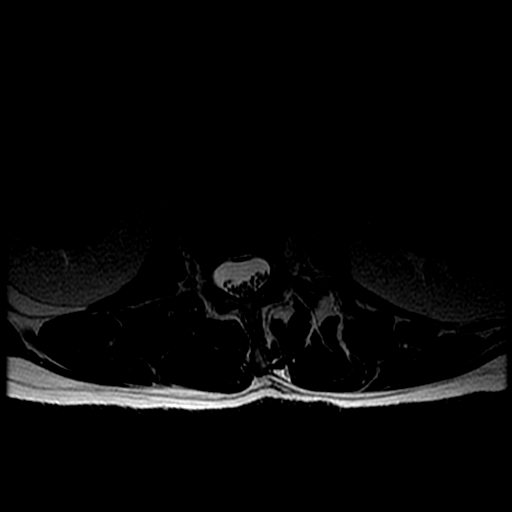

[Series 7: T1 · axial · 4.0mm · 0.39mm/px · z∈[-91,+40]mm · 3 of 35 slices shown (2 of 2)]
[im 6/35]
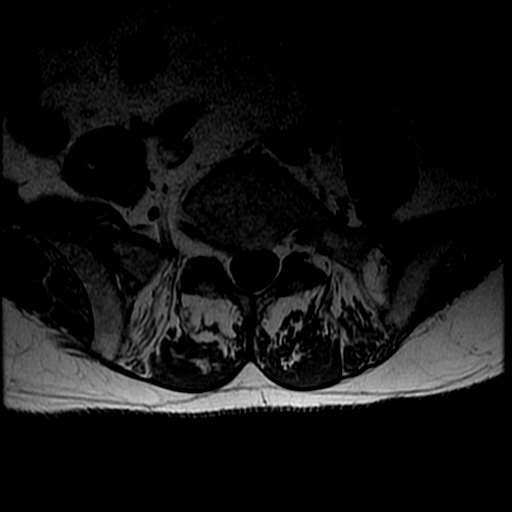
[im 19/35]
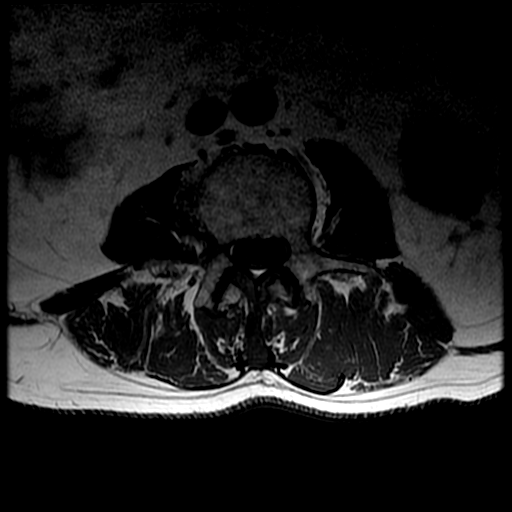
[im 29/35]
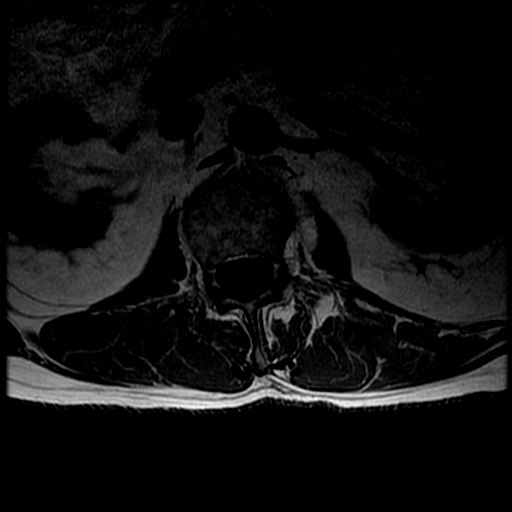

[17 of 48 positions shown; findings below may reference images not displayed]

FINDINGS: SEGMENTATION: For the purposes of this report, the last well-formed
intervertebral disc is reported as L5-S1.

ALIGNMENT: Maintained lumbar lordosis. S-type scoliosis evident on
the axial sequences. No malalignment.

VERTEBRAE:Vertebral bodies are intact. Severe disc height loss
T12-L1, L1-2 and L4-5 associated with scoliosis, compatible with
degenerative discs. Associated moderate to severe subacute on
chronic discogenic endplate changes, trace acute component T12-L1.
Mild chronic discogenic endplate changes L2-3 and L3-4. L1 inferior
endplate old Schmorl's node.

CONUS MEDULLARIS AND CAUDA EQUINA: Conus medullaris terminates at
T12-L1 and demonstrates normal morphology and signal
characteristics. Cauda equina is normal.

PARASPINAL AND OTHER SOFT TISSUES: Multiple T2 bright cysts
partially imaged bilateral kidneys. Asymmetric RIGHT paraspinal
muscle interstitial bright STIR signal.

DISC LEVELS:

T12-L1: Moderate broad-based disc bulge asymmetric to LEFT. Mild
facet arthropathy and ligamentum flavum redundancy without canal
stenosis. Mild LEFT neural foraminal narrowing.

L1-2: Small broad-based disc bulge, superimposed LEFT small
extraforaminal disc osteophyte complex. LEFT subarticular annular
fissure. Mild facet arthropathy and ligamentum flavum redundancy
without canal stenosis. Mild bilateral caudal neural foraminal
narrowing.

L2-3: Small broad-based disc bulge, mild to moderate facet
arthropathy and ligamentum flavum redundancy. Minimal canal
stenosis. Mild RIGHT, minimal LEFT neural foraminal narrowing.

L3-4: Abnormal soft tissue with low T1, intermediate to low T2
signal RIGHT neural foramen, RIGHT lateral recess and RIGHT lateral
epidural space displacing the thecal sac to the LEFT, thecal sac is
narrowed to 6 x 9 mm. RIGHT lateral recess effacement. Mild facet
arthropathy and ligamentum flavum redundancy, ligamentum flavum
appears intact. Mild canal stenosis. Broad-based RIGHT
extraforaminal disc protrusion. Moderate RIGHT neural foraminal
narrowing, mild on the LEFT.

L4-5: Moderate broad-based disc osteophyte complex asymmetric to the
RIGHT, displacing the exited RIGHT L4 nerve. Moderate to severe
RIGHT, moderate LEFT facet arthropathy. Mild canal stenosis. Severe
RIGHT, mild LEFT neural foraminal narrowing.

L5-S1: Transitional anatomy, bilateral transverse process
pseudoarthrosis. No disc bulge. Mild facet arthropathy without canal
stenosis or neural foraminal narrowing.
IMPRESSION: 1. Abnormal soft tissue RIGHT L3-4 neural foramen and lateral
epidural space concerning for disc extrusion, less likely synovial
cyst. Inferred traversing RIGHT L4 and exiting RIGHT L3 nerve
impingement.
2. Low to mid grade RIGHT lumbosacral paraspinal muscle strain.
3. No fracture.  S-type scoliosis.
4. Mild canal stenosis L3-4 and L4-5. Neural foraminal narrowing
L1-2 through L4-5: Severe on the RIGHT at L4-5 in addition to exited
RIGHT L4 nerve impingement.

## 2019-01-21 ENCOUNTER — Other Ambulatory Visit: Payer: Self-pay | Admitting: Cardiovascular Disease

## 2019-01-21 MED ORDER — POTASSIUM CHLORIDE CRYS ER 20 MEQ PO TBCR: 20.0000 meq | EXTENDED_RELEASE_TABLET | Freq: Every day | ORAL | 3 refills | Status: DC

## 2019-01-21 MED ORDER — LOSARTAN POTASSIUM 100 MG PO TABS: 100.0000 mg | ORAL_TABLET | Freq: Every day | ORAL | 3 refills | Status: DC

## 2019-01-21 MED ORDER — FENOFIBRATE 145 MG PO TABS: 145.0000 mg | ORAL_TABLET | Freq: Every day | ORAL | 3 refills | Status: DC

## 2019-01-21 MED ORDER — AMLODIPINE BESYLATE 10 MG PO TABS: 10.0000 mg | ORAL_TABLET | Freq: Every day | ORAL | 3 refills | Status: DC

## 2019-01-21 MED ORDER — HYDROCHLOROTHIAZIDE 25 MG PO TABS: 25.0000 mg | ORAL_TABLET | Freq: Every day | ORAL | 3 refills | Status: DC

## 2019-01-21 MED ORDER — ATORVASTATIN CALCIUM 40 MG PO TABS: 40.0000 mg | ORAL_TABLET | Freq: Every day | ORAL | 3 refills | Status: DC

## 2019-01-21 MED ORDER — CARVEDILOL 25 MG PO TABS: 25.0000 mg | ORAL_TABLET | Freq: Two times a day (BID) | ORAL | 3 refills | Status: DC

## 2019-03-02 DIAGNOSIS — H401111 Primary open-angle glaucoma, right eye, mild stage: Secondary | ICD-10-CM | POA: Diagnosis not present

## 2019-03-02 DIAGNOSIS — H401122 Primary open-angle glaucoma, left eye, moderate stage: Secondary | ICD-10-CM | POA: Diagnosis not present

## 2019-04-01 ENCOUNTER — Encounter: Payer: Self-pay | Admitting: *Deleted

## 2019-04-27 ENCOUNTER — Other Ambulatory Visit: Payer: Self-pay

## 2019-04-27 ENCOUNTER — Ambulatory Visit (HOSPITAL_COMMUNITY)
Admission: RE | Admit: 2019-04-27 | Discharge: 2019-04-27 | Disposition: A | Discharge status: Home / Self Care | Payer: PPO | Source: Ambulatory Visit | Attending: Cardiovascular Disease | Admitting: Cardiovascular Disease

## 2019-04-27 DIAGNOSIS — I2510 Atherosclerotic heart disease of native coronary artery without angina pectoris: Secondary | ICD-10-CM | POA: Diagnosis not present

## 2019-04-27 DIAGNOSIS — I6523 Occlusion and stenosis of bilateral carotid arteries: Secondary | ICD-10-CM | POA: Diagnosis not present

## 2019-05-04 DIAGNOSIS — L821 Other seborrheic keratosis: Secondary | ICD-10-CM | POA: Diagnosis not present

## 2019-05-04 DIAGNOSIS — D2239 Melanocytic nevi of other parts of face: Secondary | ICD-10-CM | POA: Diagnosis not present

## 2019-05-04 DIAGNOSIS — D1801 Hemangioma of skin and subcutaneous tissue: Secondary | ICD-10-CM | POA: Diagnosis not present

## 2019-05-04 DIAGNOSIS — D225 Melanocytic nevi of trunk: Secondary | ICD-10-CM | POA: Diagnosis not present

## 2019-05-04 DIAGNOSIS — L570 Actinic keratosis: Secondary | ICD-10-CM | POA: Diagnosis not present

## 2019-05-11 ENCOUNTER — Ambulatory Visit: Payer: PRIVATE HEALTH INSURANCE | Admitting: Cardiovascular Disease

## 2019-05-15 ENCOUNTER — Other Ambulatory Visit: Payer: Self-pay | Admitting: *Deleted

## 2019-05-15 NOTE — Patient Outreach (Signed)
[facility]) Care Management  05/15/2019  Xavier Bell 08/11/1941 [id number]   Follow up from [facility] screening call attempts on 4/2 and 4/6, unsuccessful. Unsuccessful outreach letter was sent on 4/8.  Patient will be followed in [facility] HTA engaged program.   Plan  Will plan call re-attempt in the next 2 months.   Joylene Draft, RN, [place] Management Coordinator  [phone]- Mobile [phone]- Toll Free Main Office

## 2019-05-28 ENCOUNTER — Ambulatory Visit: Payer: Self-pay | Admitting: *Deleted

## 2019-06-10 ENCOUNTER — Other Ambulatory Visit: Payer: Self-pay | Admitting: *Deleted

## 2019-06-10 NOTE — Patient Outreach (Signed)
[facility]) Care Management  06/10/2019  Xavier Bell 1941/10/08 [id number]   Patient  case has been transitioned to [facility] to continue Care Management services.    Joylene Draft, RN, [place] Management Coordinator  [phone]- Mobile [phone]- Toll Free Main Office

## 2019-06-15 ENCOUNTER — Other Ambulatory Visit (HOSPITAL_COMMUNITY): Payer: Self-pay | Admitting: Cardiovascular Disease

## 2019-06-15 DIAGNOSIS — I6523 Occlusion and stenosis of bilateral carotid arteries: Secondary | ICD-10-CM

## 2019-06-24 ENCOUNTER — Ambulatory Visit: Payer: Self-pay | Admitting: *Deleted

## 2019-07-30 ENCOUNTER — Ambulatory Visit: Payer: PPO | Admitting: Cardiovascular Disease

## 2019-07-31 ENCOUNTER — Ambulatory Visit: Payer: PPO | Admitting: Cardiovascular Disease

## 2019-08-12 ENCOUNTER — Encounter: Payer: Self-pay | Admitting: Cardiovascular Disease

## 2019-08-12 ENCOUNTER — Other Ambulatory Visit: Payer: Self-pay

## 2019-08-12 ENCOUNTER — Ambulatory Visit: Payer: PPO | Admitting: Cardiovascular Disease

## 2019-08-12 VITALS — BP 110/58 | HR 69 | Ht 67.0 in | Wt 165.8 lb

## 2019-08-12 DIAGNOSIS — E782 Mixed hyperlipidemia: Secondary | ICD-10-CM | POA: Diagnosis not present

## 2019-08-12 DIAGNOSIS — I739 Peripheral vascular disease, unspecified: Secondary | ICD-10-CM

## 2019-08-12 DIAGNOSIS — I2510 Atherosclerotic heart disease of native coronary artery without angina pectoris: Secondary | ICD-10-CM

## 2019-08-12 DIAGNOSIS — I779 Disorder of arteries and arterioles, unspecified: Secondary | ICD-10-CM

## 2019-08-12 DIAGNOSIS — I10 Essential (primary) hypertension: Secondary | ICD-10-CM | POA: Diagnosis not present

## 2019-08-12 NOTE — Patient Instructions (Addendum)
Medication Instructions:  Your physician recommends that you continue on your current medications as directed. Please refer to the Current Medication list given to you today.  If you need a refill on your cardiac medications before your next appointment, please call your pharmacy.   Lab work: Your physician recommends that you return for a FASTING lipid profile, basic metabolic panel, and liver function panel in 6 months  If you have labs (blood work) drawn today and your tests are completely normal, you will receive your results only by: [name] MyChart Message (if you have MyChart) OR . A paper copy in the mail If you have any lab test that is abnormal or we need to change your treatment, we will call you to review the results.  Testing/Procedures: None ordered  Follow-Up: At [facility], you and your health needs are our priority.  As part of our continuing mission to provide you with exceptional heart care, we have created designated Provider Care Teams.  These Care Teams include your primary Cardiologist (physician) and Advanced Practice Providers (APPs -  Physician Assistants and Nurse Practitioners) who all work together to provide you with the care you need, when you need it. You will need a follow up appointment in:  6 months.  Please call our office 2 months in advance to schedule this appointment.  You may see Mertie Moores, MD or one of the following Advanced Practice Providers on your designated Care Team: Richardson Dopp, PA-C [place], [place] . Daune Perch, NP  Any Other Special Instructions Will Be Listed Below (If Applicable).

## 2019-08-12 NOTE — Progress Notes (Signed)
Cardiology Office Note   Date:  08/12/2019   ID:  Xavier Bell, Xavier Bell [birth year], MRN [id number]  PCP:  Horald Pollen, MD  Cardiologist:   Mertie Moores, MD   Chief Complaint  Patient presents with  . Coronary Artery Disease  . Hypertension   1. Coronary artery disease-status post CABG 2. Atrial flutter- s/p cardioversion 02/08/12 3. Hypertension 4. Right carotid bruit     Has a history of atrial flutter this past spring. He status post cardioversion. [name] done very well. He continues to take Xarelto. [name] not been exercising nearly as much as he needs to. [name] also been careless with his diet and has picked up a few pounds.  March 06, 2013:  He is doing well. No angina. He is continuing to have foot problems - especially of the left foot. He has intense pain in his heel when he walks.  Sept. 12, 2014:  He has done well. Still not exercising as much. Takes motrin at night. He has left plantar faciatis.   March 19, 2014:  Xavier Bell is doing ok No CP or dyspnea. No palpitations. He has gone back into atrial flutter. He cannot tell that his HR is irreg.   He has a hx of a-flutter in the past ( 2014) and was treated with Xarelto and was cardioverted.   May 09, 2015 Xavier Bell is a 78 y.o. male who presents for follow up of his atrial flutter , HTN, CAD No CP , not exercising much .   Nov. 14, 2016: Doing well. BP is a bit high. Not much exercise ,  Sore feet.   May 28, 2016:  Doing well.  BP is normal  No CP , no dyspnea at rest.   Slight DOE with activity. Not exercising   Jan. 8, 2018:  Xavier Bell is seen today .   Doing well.   Not exercising much .   Knows that he needs to.  Aug. 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  No CP , no dyspnea   April 21, 2018:  Xavier Bell is seen back today for follow-up of his coronary artery disease and mild to moderate carotid artery disease.  Recent carotid duplex scan reveals moderate left internal  carotid plaque and mild right internal carotid disease.  Hx of hyperlipidemia ,    Had some back issues  - has 3 bulging discs, 1 was herniated  Back has improved some with conservative management .   Does not think he will need surgery any time soon.   No CP or dyspnea   November 03, 2018: She is seen today for follow-up of his coronary artery disease and moderate carotid artery disease.  Is a moderate left internal carotid plaque.  He has mild right internal carotid disease.  Is planning on selling his [place]  No CP or dyspnea Bp is a bit elevated  Has been eating more salt than he should   Aug. 19, 2020 :  Xavier Bell is seen today for follow up visit Has a hx of atrial flutter - is s/p ablation  .   - is in NSR  Today  BP is a bit elevated.  Has gained a few lbs.     Past Medical History:  Diagnosis Date  . Atrial flutter ([place])    s/p cardioversion 02/08/12; ablation by [name] [phone]  . Chronic anticoagulation   . Coronary artery disease    s/p remote MI in 1994 with  multiple PCI and subsequent CABG x 5 in 1996  . Dyslipidemia   . Hypertension   . MI (myocardial infarction) ([place]) 1995  . Normal nuclear stress test 2006  . Pseudoaphakia    both eyes  . Retinal detachment     Past Surgical History:  Procedure Laterality Date  . ATRIAL FLUTTER ABLATION N/A 12/08/2014   CTI by [name]  . CARDIAC CATHETERIZATION  1996  . CARDIOVERSION  02/08/2012   Procedure: CARDIOVERSION;  Surgeon: Darden Amber., MD;  Location: [facility];  Service: Cardiovascular;  Laterality: N/A;  . CORONARY ARTERY BYPASS GRAFT  1996   LIMA to LAD, SVG to RV marginal & PD, SVG  to DX and SVG to Ramus  . TONSILLECTOMY AND ADENOIDECTOMY       Current Outpatient Medications  Medication Sig Dispense Refill  . amLODipine (NORVASC) 10 MG tablet Take 1 tablet (10 mg total) by mouth daily. 90 tablet 3  . aspirin 81 MG tablet Take 81 mg by mouth daily.    [name] atorvastatin (LIPITOR) 40 MG tablet  Take 1 tablet (40 mg total) by mouth daily. 90 tablet 3  . carvedilol (COREG) 25 MG tablet Take 1 tablet (25 mg total) by mouth 2 (two) times daily. 180 tablet 3  . Cholecalciferol (VITAMIN D) 2000 units tablet Take 2,000 Units by mouth daily.    . dorzolamide-timolol (COSOPT) 22.3-6.8 MG/ML ophthalmic solution Place 2 drops into both eyes 2 (two) times daily.    . fenofibrate (TRICOR) 145 MG tablet Take 1 tablet (145 mg total) by mouth daily. 90 tablet 3  . hydrochlorothiazide (HYDRODIURIL) 25 MG tablet Take 1 tablet (25 mg total) by mouth daily. 90 tablet 3  . latanoprost (XALATAN) 0.005 % ophthalmic solution Place 1 drop into both eyes at bedtime.     [name] losartan (COZAAR) 100 MG tablet Take 1 tablet (100 mg total) by mouth daily. 90 tablet 3  . Multiple Vitamin (MULTIVITAMIN) tablet Take 1 tablet by mouth daily.      . Omega-3 Fatty Acids (OMEGA 3 PO) Take 1 capsule by mouth daily.    . potassium chloride SA (K-DUR,KLOR-CON) 20 MEQ tablet Take 1 tablet (20 mEq total) by mouth daily. 90 tablet 3  . Zinc 50 MG CAPS Take 50 mg by mouth daily.      No current facility-administered medications for this visit.     Allergies:   Brimonidine and Niacin and related    Social History:  The patient  reports that he has never smoked. He has never used smokeless tobacco. He reports that he does not drink alcohol or use drugs.   Family History:  The patient's family history includes Coronary artery disease in his unknown relative; Heart disease in his father and mother.    ROS:  Noted in current hx , otherwise , ROS is negative   Physical Exam: Blood pressure (!) 110/58, pulse 69, height 5\' 7"  (1.702 m), weight 165 lb 12.8 oz (75.2 kg), SpO2 96 %.  GEN:  Well nourished, well developed in no acute distress HEENT: Normal NECK: No JVD; soft R carotid bruit  LYMPHATICS: No lymphadenopathy CARDIAC: RRR , no murmurs, rubs, gallops RESPIRATORY:  Clear to auscultation without rales, wheezing or rhonchi   ABDOMEN: Soft, non-tender, non-distended MUSCULOSKELETAL:  No edema; No deformity  SKIN: Warm and dry NEUROLOGIC:  Alert and oriented x 3    EKG:    Aug. 19, 2020 :  NSR with 1st degree AV block .  TWI in inferior leads.   Recent Labs: 11/03/2018: ALT 21; BUN 29; Creatinine, Ser 1.42; Potassium 4.4; Sodium 143    Lipid Panel    Component Value Date/Time   CHOL 111 11/03/2018 0855   TRIG 96 11/03/2018 0855   HDL 34 (L) 11/03/2018 0855   CHOLHDL 3.3 11/03/2018 0855   CHOLHDL 2.7 05/28/2016 0816   VLDL 16 05/28/2016 0816   LDLCALC 58 11/03/2018 0855      Wt Readings from Last 3 Encounters:  08/12/19 165 lb 12.8 oz (75.2 kg)  11/03/18 160 lb 6.4 oz (72.8 kg)  04/21/18 163 lb 8 oz (74.2 kg)      Other studies Reviewed: Additional studies/ records that were reviewed today include: . Review of the above records demonstrates:    ASSESSMENT AND PLAN:  1. Coronary artery disease-   No angina   2. Atrial flutter  -  S/p ablation .  Doing well   3. Hypertensive heart disease without CHF -   blood pressure is much better today after he sat and rested for about 5 minutes.  He had some car difficulties on his way here that caused his blood pressure to go up.  4. Right carotid bruit-    he has mild to moderate right carotid disease.  His last duplex scan was May, 2020.  The recommendation was to repeat it in 1 year.  5. Hyperlipidemia:     Repeat in 6 months at his next offic evisit     Current medicines are reviewed at length with the patient today.  The patient does not have concerns regarding medicines.  The following changes have been made:  no change  Labs/ tests ordered today include:   Orders Placed This Encounter  Procedures  . Basic metabolic panel  . Hepatic function panel  . Lipid panel  . EKG 12-Lead     Disposition:   FU with me in 6 months .   Will get fasting labs today .      Mertie Moores, MD  08/12/2019 6:05 PM    [address], [place], [place]  [address] Phone: [phone]; Fax: [phone]

## 2019-08-24 DIAGNOSIS — H401122 Primary open-angle glaucoma, left eye, moderate stage: Secondary | ICD-10-CM | POA: Diagnosis not present

## 2019-08-24 DIAGNOSIS — H401111 Primary open-angle glaucoma, right eye, mild stage: Secondary | ICD-10-CM | POA: Diagnosis not present

## 2019-11-16 ENCOUNTER — Telehealth: Payer: Self-pay | Admitting: *Deleted

## 2019-11-16 NOTE — Telephone Encounter (Signed)
Schedule awv  No voice mail

## 2019-12-07 DIAGNOSIS — S8252XA Displaced fracture of medial malleolus of left tibia, initial encounter for closed fracture: Secondary | ICD-10-CM | POA: Diagnosis not present

## 2019-12-07 DIAGNOSIS — R52 Pain, unspecified: Secondary | ICD-10-CM | POA: Diagnosis not present

## 2019-12-07 DIAGNOSIS — M7732 Calcaneal spur, left foot: Secondary | ICD-10-CM | POA: Diagnosis not present

## 2019-12-07 DIAGNOSIS — R609 Edema, unspecified: Secondary | ICD-10-CM | POA: Diagnosis not present

## 2019-12-07 DIAGNOSIS — M7989 Other specified soft tissue disorders: Secondary | ICD-10-CM | POA: Diagnosis not present

## 2019-12-07 DIAGNOSIS — S8262XA Displaced fracture of lateral malleolus of left fibula, initial encounter for closed fracture: Secondary | ICD-10-CM | POA: Diagnosis not present

## 2019-12-07 DIAGNOSIS — W19XXXA Unspecified fall, initial encounter: Secondary | ICD-10-CM | POA: Diagnosis not present

## 2019-12-07 DIAGNOSIS — S82892A Other fracture of left lower leg, initial encounter for closed fracture: Secondary | ICD-10-CM | POA: Diagnosis not present

## 2019-12-07 DIAGNOSIS — S82832A Other fracture of upper and lower end of left fibula, initial encounter for closed fracture: Secondary | ICD-10-CM | POA: Diagnosis not present

## 2019-12-07 DIAGNOSIS — M19072 Primary osteoarthritis, left ankle and foot: Secondary | ICD-10-CM | POA: Diagnosis not present

## 2019-12-09 ENCOUNTER — Other Ambulatory Visit: Payer: Self-pay

## 2019-12-09 ENCOUNTER — Encounter (HOSPITAL_BASED_OUTPATIENT_CLINIC_OR_DEPARTMENT_OTHER): Payer: Self-pay | Admitting: Orthopedic Surgery

## 2019-12-09 ENCOUNTER — Other Ambulatory Visit (HOSPITAL_COMMUNITY)
Admission: RE | Admit: 2019-12-09 | Discharge: 2019-12-09 | Disposition: A | Discharge status: Home / Self Care | Payer: PPO | Source: Ambulatory Visit | Attending: Orthopedic Surgery | Admitting: Orthopedic Surgery

## 2019-12-09 ENCOUNTER — Other Ambulatory Visit (HOSPITAL_COMMUNITY): Payer: Self-pay | Admitting: Orthopedic Surgery

## 2019-12-09 ENCOUNTER — Other Ambulatory Visit: Payer: Self-pay | Admitting: Cardiovascular Disease

## 2019-12-09 DIAGNOSIS — Z01812 Encounter for preprocedural laboratory examination: Secondary | ICD-10-CM | POA: Diagnosis not present

## 2019-12-09 DIAGNOSIS — Z20828 Contact with and (suspected) exposure to other viral communicable diseases: Secondary | ICD-10-CM | POA: Diagnosis not present

## 2019-12-09 DIAGNOSIS — S82842A Displaced bimalleolar fracture of left lower leg, initial encounter for closed fracture: Secondary | ICD-10-CM | POA: Diagnosis not present

## 2019-12-09 LAB — SARS CORONAVIRUS 2 (TAT 6-24 HRS): SARS Coronavirus 2: NEGATIVE

## 2019-12-09 MED ORDER — ATORVASTATIN CALCIUM 40 MG PO TABS: 40.0000 mg | ORAL_TABLET | Freq: Every day | ORAL | 0 refills | Status: DC

## 2019-12-09 NOTE — Progress Notes (Signed)
chart reviewed by Dr. Royce Macadamia and will proceed with surgery as scheduled no additional cardiology note needed.

## 2019-12-09 NOTE — Telephone Encounter (Signed)
New message     *STAT* If patient is at the pharmacy, call can be transferred to refill team.   1. Which medications need to be refilled? (please list name of each medication and dose if known) atorvastatin (LIPITOR) 40 MG tablet  2. Which pharmacy/location (including street and city if local pharmacy) is medication to be sent to? [facility] in [place]   3. Do they need a 30 day or 90 day supply? 44     Pt says he placed his mail order on Nov 30th and still has not yet received it.  Pt is wondering if he can get a supply sent to the pharmacy because he is currently out of medication     Please call

## 2019-12-09 NOTE — Telephone Encounter (Signed)
Returned call to pt and he has been made aware that we have sent in a 30 day supply to [facility] in [place], [place] , per pt request. Pt very grateful!

## 2019-12-10 ENCOUNTER — Ambulatory Visit (HOSPITAL_BASED_OUTPATIENT_CLINIC_OR_DEPARTMENT_OTHER): Payer: PPO | Admitting: Anesthesiology

## 2019-12-10 ENCOUNTER — Encounter (HOSPITAL_BASED_OUTPATIENT_CLINIC_OR_DEPARTMENT_OTHER)
Admission: RE | Disposition: A | Discharge status: Home / Self Care | Payer: Self-pay | Source: Home / Self Care | Attending: Orthopedic Surgery

## 2019-12-10 ENCOUNTER — Ambulatory Visit (HOSPITAL_BASED_OUTPATIENT_CLINIC_OR_DEPARTMENT_OTHER)
Admission: RE | Admit: 2019-12-10 | Discharge: 2019-12-10 | Disposition: A | Discharge status: Home / Self Care | Payer: PPO | Attending: Orthopedic Surgery | Admitting: Orthopedic Surgery

## 2019-12-10 ENCOUNTER — Encounter (HOSPITAL_BASED_OUTPATIENT_CLINIC_OR_DEPARTMENT_OTHER): Payer: Self-pay | Admitting: Orthopedic Surgery

## 2019-12-10 DIAGNOSIS — I252 Old myocardial infarction: Secondary | ICD-10-CM | POA: Insufficient documentation

## 2019-12-10 DIAGNOSIS — Z888 Allergy status to other drugs, medicaments and biological substances status: Secondary | ICD-10-CM | POA: Diagnosis not present

## 2019-12-10 DIAGNOSIS — S82842A Displaced bimalleolar fracture of left lower leg, initial encounter for closed fracture: Secondary | ICD-10-CM | POA: Insufficient documentation

## 2019-12-10 DIAGNOSIS — Z951 Presence of aortocoronary bypass graft: Secondary | ICD-10-CM | POA: Diagnosis not present

## 2019-12-10 DIAGNOSIS — Z79899 Other long term (current) drug therapy: Secondary | ICD-10-CM | POA: Insufficient documentation

## 2019-12-10 DIAGNOSIS — I10 Essential (primary) hypertension: Secondary | ICD-10-CM | POA: Diagnosis not present

## 2019-12-10 DIAGNOSIS — G8918 Other acute postprocedural pain: Secondary | ICD-10-CM | POA: Diagnosis not present

## 2019-12-10 DIAGNOSIS — S93432A Sprain of tibiofibular ligament of left ankle, initial encounter: Secondary | ICD-10-CM | POA: Diagnosis not present

## 2019-12-10 DIAGNOSIS — I4892 Unspecified atrial flutter: Secondary | ICD-10-CM | POA: Insufficient documentation

## 2019-12-10 DIAGNOSIS — Z7982 Long term (current) use of aspirin: Secondary | ICD-10-CM | POA: Diagnosis not present

## 2019-12-10 DIAGNOSIS — Z20828 Contact with and (suspected) exposure to other viral communicable diseases: Secondary | ICD-10-CM | POA: Diagnosis not present

## 2019-12-10 DIAGNOSIS — Z8249 Family history of ischemic heart disease and other diseases of the circulatory system: Secondary | ICD-10-CM | POA: Insufficient documentation

## 2019-12-10 DIAGNOSIS — I2510 Atherosclerotic heart disease of native coronary artery without angina pectoris: Secondary | ICD-10-CM | POA: Diagnosis not present

## 2019-12-10 DIAGNOSIS — I739 Peripheral vascular disease, unspecified: Secondary | ICD-10-CM | POA: Diagnosis not present

## 2019-12-10 DIAGNOSIS — E782 Mixed hyperlipidemia: Secondary | ICD-10-CM | POA: Diagnosis not present

## 2019-12-10 DIAGNOSIS — W138XXA Fall from, out of or through other building or structure, initial encounter: Secondary | ICD-10-CM | POA: Diagnosis not present

## 2019-12-10 DIAGNOSIS — Z7901 Long term (current) use of anticoagulants: Secondary | ICD-10-CM | POA: Insufficient documentation

## 2019-12-10 DIAGNOSIS — I4891 Unspecified atrial fibrillation: Secondary | ICD-10-CM | POA: Diagnosis not present

## 2019-12-10 HISTORY — PX: ORIF ANKLE FRACTURE: SHX5408

## 2019-12-10 LAB — BASIC METABOLIC PANEL
Anion gap: 10 (ref 5–15)
Anion gap: 10 (ref 5–15)
BUN: 27 mg/dL — ABNORMAL HIGH (ref 8–23)
BUN: 24 mg/dL — ABNORMAL HIGH (ref 8–23)
CO2: 21 mmol/L — ABNORMAL LOW (ref 22–32)
CO2: 22 mmol/L (ref 22–32)
Calcium: 9.1 mg/dL (ref 8.9–10.3)
Calcium: 9.1 mg/dL (ref 8.9–10.3)
Chloride: 107 mmol/L (ref 98–111)
Chloride: 109 mmol/L (ref 98–111)
Creatinine, Ser: 1.24 mg/dL (ref 0.61–1.24)
Creatinine, Ser: 1.17 mg/dL (ref 0.61–1.24)
GFR calc Af Amer: >60 mL/min (ref >60)
GFR calc Af Amer: >60 mL/min (ref >60)
GFR calc non Af Amer: 55 mL/min — ABNORMAL LOW (ref >60)
GFR calc non Af Amer: 59 mL/min — ABNORMAL LOW (ref >60)
Glucose, Bld: 103 mg/dL — ABNORMAL HIGH (ref 70–99)
Glucose, Bld: 105 mg/dL — ABNORMAL HIGH (ref 70–99)
Potassium: 6 mmol/L — ABNORMAL HIGH (ref 3.5–5.1)
Potassium: 3.9 mmol/L (ref 3.5–5.1)
Sodium: 138 mmol/L (ref 135–145)
Sodium: 141 mmol/L (ref 135–145)

## 2019-12-10 SURGERY — OPEN REDUCTION INTERNAL FIXATION (ORIF) ANKLE FRACTURE
Anesthesia: General | Site: Ankle | Laterality: Left

## 2019-12-10 MED ORDER — BUPIVACAINE-EPINEPHRINE (PF) 0.5% -1:200000 IJ SOLN
INTRAMUSCULAR | Status: DC | PRN
  Administered 2019-12-10: 10 mL via PERINEURAL
  Administered 2019-12-10: 25 mL via PERINEURAL

## 2019-12-10 MED ORDER — FENTANYL CITRATE (PF) 100 MCG/2ML IJ SOLN
INTRAMUSCULAR | Status: AC
  Filled 2019-12-10: qty 2

## 2019-12-10 MED ORDER — NAPROXEN SODIUM 220 MG PO TABS: 440.0000 mg | ORAL_TABLET | Freq: Two times a day (BID) | ORAL | Status: DC

## 2019-12-10 MED ORDER — CEFAZOLIN SODIUM-DEXTROSE 2-4 GM/100ML-% IV SOLN
INTRAVENOUS | Status: AC
  Filled 2019-12-10: qty 100

## 2019-12-10 MED ORDER — 0.9 % SODIUM CHLORIDE (POUR BTL) OPTIME
TOPICAL | Status: DC | PRN
  Administered 2019-12-10: 17:00:00 1000 mL

## 2019-12-10 MED ORDER — VANCOMYCIN HCL 500 MG IV SOLR
INTRAVENOUS | Status: DC | PRN
  Administered 2019-12-10: 500 mg via TOPICAL

## 2019-12-10 MED ORDER — PHENYLEPHRINE 40 MCG/ML (10ML) SYRINGE FOR IV PUSH (FOR BLOOD PRESSURE SUPPORT)
PREFILLED_SYRINGE | INTRAVENOUS | Status: AC
  Filled 2019-12-10: qty 10

## 2019-12-10 MED ORDER — VANCOMYCIN HCL 500 MG IV SOLR
INTRAVENOUS | Status: AC
  Filled 2019-12-10: qty 500

## 2019-12-10 MED ORDER — EPHEDRINE SULFATE-NACL 50-0.9 MG/10ML-% IV SOSY
PREFILLED_SYRINGE | INTRAVENOUS | Status: DC | PRN
  Administered 2019-12-10 (×2): 5 mg via INTRAVENOUS
  Administered 2019-12-10: 10 mg via INTRAVENOUS
  Administered 2019-12-10: 5 mg via INTRAVENOUS
  Administered 2019-12-10: 10 mg via INTRAVENOUS

## 2019-12-10 MED ORDER — MIDAZOLAM HCL 2 MG/2ML IJ SOLN
INTRAMUSCULAR | Status: AC
  Filled 2019-12-10: qty 2

## 2019-12-10 MED ORDER — EPHEDRINE 5 MG/ML INJ
INTRAVENOUS | Status: AC
  Filled 2019-12-10: qty 10

## 2019-12-10 MED ORDER — PROPOFOL 10 MG/ML IV BOLUS
INTRAVENOUS | Status: DC | PRN
  Administered 2019-12-10: 120 mg via INTRAVENOUS

## 2019-12-10 MED ORDER — ONDANSETRON HCL 4 MG/2ML IJ SOLN
INTRAMUSCULAR | Status: DC | PRN
  Administered 2019-12-10 (×2): 4 mg via INTRAVENOUS

## 2019-12-10 MED ORDER — MIDAZOLAM HCL 2 MG/2ML IJ SOLN: 1.0000 mg | INTRAMUSCULAR | Status: DC | PRN

## 2019-12-10 MED ORDER — FENTANYL CITRATE (PF) 100 MCG/2ML IJ SOLN: 25.0000 ug | INTRAMUSCULAR | Status: DC | PRN

## 2019-12-10 MED ORDER — PHENYLEPHRINE 40 MCG/ML (10ML) SYRINGE FOR IV PUSH (FOR BLOOD PRESSURE SUPPORT)
PREFILLED_SYRINGE | INTRAVENOUS | Status: DC | PRN
  Administered 2019-12-10: 80 ug via INTRAVENOUS

## 2019-12-10 MED ORDER — CHLORHEXIDINE GLUCONATE 4 % EX LIQD: 60.0000 mL | Freq: Once | CUTANEOUS | Status: DC

## 2019-12-10 MED ORDER — LACTATED RINGERS IV SOLN: INTRAVENOUS | Status: DC

## 2019-12-10 MED ORDER — OXYCODONE HCL 5 MG PO TABS: 5.0000 mg | ORAL_TABLET | Freq: Four times a day (QID) | ORAL | 0 refills | Status: AC | PRN

## 2019-12-10 MED ORDER — FENTANYL CITRATE (PF) 100 MCG/2ML IJ SOLN
50.0000 ug | INTRAMUSCULAR | Status: DC | PRN
  Administered 2019-12-10: 50 ug via INTRAVENOUS

## 2019-12-10 MED ORDER — ONDANSETRON HCL 4 MG/2ML IJ SOLN: 4.0000 mg | Freq: Once | INTRAMUSCULAR | Status: DC | PRN

## 2019-12-10 MED ORDER — SODIUM CHLORIDE 0.9 % IV SOLN: INTRAVENOUS | Status: DC

## 2019-12-10 MED ORDER — DEXAMETHASONE SODIUM PHOSPHATE 10 MG/ML IJ SOLN
INTRAMUSCULAR | Status: DC | PRN
  Administered 2019-12-10: 5 mg via INTRAVENOUS

## 2019-12-10 MED ORDER — OXYCODONE HCL 5 MG/5ML PO SOLN: 5.0000 mg | Freq: Once | ORAL | Status: DC | PRN

## 2019-12-10 MED ORDER — LIDOCAINE HCL (CARDIAC) PF 100 MG/5ML IV SOSY
PREFILLED_SYRINGE | INTRAVENOUS | Status: DC | PRN
  Administered 2019-12-10: 40 mg via INTRAVENOUS

## 2019-12-10 MED ORDER — OXYCODONE HCL 5 MG PO TABS: 5.0000 mg | ORAL_TABLET | Freq: Once | ORAL | Status: DC | PRN

## 2019-12-10 MED ORDER — CEFAZOLIN SODIUM-DEXTROSE 2-4 GM/100ML-% IV SOLN
2.0000 g | INTRAVENOUS | Status: AC
  Administered 2019-12-10: 2 g via INTRAVENOUS

## 2019-12-10 SURGICAL SUPPLY — 86 items
BANDAGE ESMARK 6X9 LF (GAUZE/BANDAGES/DRESSINGS) IMPLANT
BIT DRILL 2.5X2.75 QC CALB (BIT) ×2 IMPLANT
BIT DRILL 2.9 CANN QC NONSTRL (BIT) ×2 IMPLANT
BLADE SURG 15 STRL LF DISP TIS (BLADE) ×2 IMPLANT
BLADE SURG 15 STRL SS (BLADE) ×4
BNDG CMPR 9X6 STRL LF SNTH (GAUZE/BANDAGES/DRESSINGS)
BNDG COHESIVE 4X5 TAN STRL (GAUZE/BANDAGES/DRESSINGS) ×3 IMPLANT
BNDG COHESIVE 6X5 TAN STRL LF (GAUZE/BANDAGES/DRESSINGS) ×3 IMPLANT
BNDG ESMARK 4X9 LF (GAUZE/BANDAGES/DRESSINGS) IMPLANT
BNDG ESMARK 6X9 LF (GAUZE/BANDAGES/DRESSINGS)
CANISTER SUCT 1200ML W/VALVE (MISCELLANEOUS) ×3 IMPLANT
CHLORAPREP W/TINT 26 (MISCELLANEOUS) ×3 IMPLANT
COVER BACK TABLE REUSABLE LG (DRAPES) ×3 IMPLANT
COVER WAND RF STERILE (DRAPES) IMPLANT
CUFF TOURN SGL QUICK 34 (TOURNIQUET CUFF)
CUFF TRNQT CYL 34X4.125X (TOURNIQUET CUFF) IMPLANT
DECANTER SPIKE VIAL GLASS SM (MISCELLANEOUS) IMPLANT
DRAPE EXTREMITY T 121X128X90 (DISPOSABLE) ×3 IMPLANT
DRAPE HALF SHEET 70X43 (DRAPES) ×3 IMPLANT
DRAPE OEC MINIVIEW 54X84 (DRAPES) ×3 IMPLANT
DRAPE U-SHAPE 47X51 STRL (DRAPES) ×3 IMPLANT
DRSG MEPITEL 4X7.2 (GAUZE/BANDAGES/DRESSINGS) ×3 IMPLANT
DRSG PAD ABDOMINAL 8X10 ST (GAUZE/BANDAGES/DRESSINGS) ×6 IMPLANT
ELECT REM PT RETURN 9FT ADLT (ELECTROSURGICAL) ×3
ELECTRODE REM PT RTRN 9FT ADLT (ELECTROSURGICAL) ×1 IMPLANT
FIXATION ZIPTIGHT ANKLE SNDSMS (Ankle) IMPLANT
GAUZE SPONGE 4X4 12PLY STRL (GAUZE/BANDAGES/DRESSINGS) ×3 IMPLANT
GLOVE BIO SURGEON STRL SZ7 (GLOVE) ×4 IMPLANT
GLOVE BIO SURGEON STRL SZ8 (GLOVE) ×3 IMPLANT
GLOVE BIOGEL PI IND STRL 7.0 (GLOVE) IMPLANT
GLOVE BIOGEL PI IND STRL 7.5 (GLOVE) IMPLANT
GLOVE BIOGEL PI IND STRL 8 (GLOVE) ×2 IMPLANT
GLOVE BIOGEL PI INDICATOR 7.0 (GLOVE) ×2
GLOVE BIOGEL PI INDICATOR 7.5 (GLOVE) ×4
GLOVE BIOGEL PI INDICATOR 8 (GLOVE) ×2
GLOVE ECLIPSE 8.0 STRL XLNG CF (GLOVE) ×1 IMPLANT
GOWN STRL REUS W/ TWL LRG LVL3 (GOWN DISPOSABLE) ×1 IMPLANT
GOWN STRL REUS W/ TWL XL LVL3 (GOWN DISPOSABLE) ×2 IMPLANT
GOWN STRL REUS W/TWL LRG LVL3 (GOWN DISPOSABLE)
GOWN STRL REUS W/TWL XL LVL3 (GOWN DISPOSABLE) ×6
K-WIRE ACE 1.6X6 (WIRE) ×6
KWIRE ACE 1.6X6 (WIRE) IMPLANT
NEEDLE HYPO 22GX1.5 SAFETY (NEEDLE) IMPLANT
NS IRRIG 1000ML POUR BTL (IV SOLUTION) ×3 IMPLANT
PACK BASIN DAY SURGERY FS (CUSTOM PROCEDURE TRAY) ×3 IMPLANT
PAD CAST 4YDX4 CTTN HI CHSV (CAST SUPPLIES) ×1 IMPLANT
PADDING CAST ABS 4INX4YD NS (CAST SUPPLIES)
PADDING CAST ABS COTTON 4X4 ST (CAST SUPPLIES) IMPLANT
PADDING CAST COTTON 4X4 STRL (CAST SUPPLIES) ×2
PADDING CAST COTTON 6X4 STRL (CAST SUPPLIES) ×3 IMPLANT
PENCIL SMOKE EVACUATOR (MISCELLANEOUS) ×3 IMPLANT
PLATE ACE 3.5MM 2HOLE (Plate) ×2 IMPLANT
PLATE FIBULAR COMP LOCK 10H (Plate) ×2 IMPLANT
SANITIZER HAND PURELL 535ML FO (MISCELLANEOUS) ×3 IMPLANT
SCREW ACE CAN 4.0 40M (Screw) ×4 IMPLANT
SCREW CORTICAL 3.5MM  28MM (Screw) ×2 IMPLANT
SCREW CORTICAL 3.5MM 14MM (Screw) ×2 IMPLANT
SCREW CORTICAL 3.5MM 18MM (Screw) ×2 IMPLANT
SCREW CORTICAL 3.5MM 28MM (Screw) IMPLANT
SCREW CORTICAL LOW PROF 3.5X20 (Screw) ×2 IMPLANT
SCREW LOCK CORT STAR 3.5X10 (Screw) ×2 IMPLANT
SCREW LOCK CORT STAR 3.5X16 (Screw) ×4 IMPLANT
SCREW NON LOCKING LP 3.5 14MM (Screw) ×4 IMPLANT
SCREW NON LOCKING LP 3.5 16MM (Screw) ×2 IMPLANT
SLEEVE SCD COMPRESS KNEE MED (MISCELLANEOUS) ×3 IMPLANT
SPLINT FAST PLASTER 5X30 (CAST SUPPLIES) ×40
SPLINT PLASTER CAST FAST 5X30 (CAST SUPPLIES) ×20 IMPLANT
SPONGE LAP 18X18 RF (DISPOSABLE) ×3 IMPLANT
STOCKINETTE 6  STRL (DRAPES) ×2
STOCKINETTE 6 STRL (DRAPES) ×1 IMPLANT
SUCTION FRAZIER HANDLE 10FR (MISCELLANEOUS) ×2
SUCTION TUBE FRAZIER 10FR DISP (MISCELLANEOUS) ×1 IMPLANT
SUT ETHILON 3 0 PS 1 (SUTURE) ×3 IMPLANT
SUT FIBERWIRE #2 38 T-5 BLUE (SUTURE)
SUT MNCRL AB 3-0 PS2 18 (SUTURE) IMPLANT
SUT VIC AB 0 SH 27 (SUTURE) IMPLANT
SUT VIC AB 2-0 SH 27 (SUTURE) ×2
SUT VIC AB 2-0 SH 27XBRD (SUTURE) ×1 IMPLANT
SUTURE FIBERWR #2 38 T-5 BLUE (SUTURE) IMPLANT
SYR BULB 3OZ (MISCELLANEOUS) ×3 IMPLANT
SYR CONTROL 10ML LL (SYRINGE) IMPLANT
TOWEL GREEN STERILE FF (TOWEL DISPOSABLE) ×6 IMPLANT
TUBE CONNECTING 20'X1/4 (TUBING) ×1
TUBE CONNECTING 20X1/4 (TUBING) ×2 IMPLANT
UNDERPAD 30X36 HEAVY ABSORB (UNDERPADS AND DIAPERS) ×3 IMPLANT
ZIPTIGHT ANKLE SYNODESMOSS FIX (Ankle) ×3 IMPLANT

## 2019-12-10 NOTE — Anesthesia Preprocedure Evaluation (Addendum)
Anesthesia Evaluation  Patient identified by MRN, date of birth, ID band Patient awake    Reviewed: Allergy & Precautions, NPO status , Patient's Chart, lab work & pertinent test results, reviewed documented beta blocker date and time   History of Anesthesia Complications Negative for: history of anesthetic complications  Airway Mallampati: II  TM Distance: >3 FB Neck ROM: Full    Dental  (+) Dental Advisory Given   Pulmonary neg pulmonary ROS,    Pulmonary exam normal        Cardiovascular hypertension, Pt. on medications and Pt. on home beta blockers + CAD, + Past MI, + CABG and + Peripheral Vascular Disease  Normal cardiovascular exam+ dysrhythmias (s/p ablation) Atrial Fibrillation + Valvular Problems/Murmurs    '20 Carotid US - 40-59% left ICAS, 1-39% right ICAS  '12 TTE - EF 60% to 65%. Mild MR. Mild-mod LA dilatation. Moderate TR.    Neuro/Psych negative neurological ROS  negative psych ROS   GI/Hepatic negative GI ROS, Neg liver ROS,   Endo/Other  negative endocrine ROS  Renal/GU negative Renal ROS     Musculoskeletal negative musculoskeletal ROS (+)   Abdominal   Peds  Hematology negative hematology ROS (+)   Anesthesia Other Findings Covid neg 12/16   Reproductive/Obstetrics                            Anesthesia Physical Anesthesia Plan  ASA: III  Anesthesia Plan: General   Post-op Pain Management:  Regional for Post-op pain   Induction: Intravenous  PONV Risk Score and Plan: 2 and Treatment may vary due to age or medical condition and Ondansetron  Airway Management Planned: LMA  Additional Equipment: None  Intra-op Plan:   Post-operative Plan: Extubation in OR  Informed Consent: I have reviewed the patients History and Physical, chart, labs and discussed the procedure including the risks, benefits and alternatives for the proposed anesthesia with the patient or  authorized representative who has indicated his/her understanding and acceptance.     Dental advisory given  Plan Discussed with: CRNA and Anesthesiologist  Anesthesia Plan Comments:        Anesthesia Quick Evaluation

## 2019-12-10 NOTE — Anesthesia Procedure Notes (Signed)
Procedure Name: LMA Insertion Date/Time: 12/10/2019 3:59 PM Performed by: Raenette Rover, CRNA Pre-anesthesia Checklist: Patient identified, Emergency Drugs available, Suction available and Patient being monitored Patient Re-evaluated:Patient Re-evaluated prior to induction Oxygen Delivery Method: Circle system utilized Preoxygenation: Pre-oxygenation with 100% oxygen Induction Type: IV induction LMA: LMA inserted LMA Size: 4.0 Number of attempts: 1 Placement Confirmation: positive ETCO2 and breath sounds checked- equal and bilateral Tube secured with: Tape Dental Injury: Teeth and Oropharynx as per pre-operative assessment

## 2019-12-10 NOTE — Anesthesia Procedure Notes (Signed)
Anesthesia Regional Block: Adductor canal block   Pre-Anesthetic Checklist: ,, timeout performed, Correct Patient, Correct Site, Correct Laterality, Correct Procedure, Correct Position, site marked, Risks and benefits discussed,  Surgical consent,  Pre-op evaluation,  At surgeon's request and post-op pain management  Laterality: Left  Prep: chloraprep       Needles:  Injection technique: Single-shot  Needle Type: Echogenic Needle     Needle Length: 10cm  Needle Gauge: 21     Additional Needles:   Narrative:  Start time: 12/10/2019 2:08 PM End time: 12/10/2019 2:12 PM Injection made incrementally with aspirations every 5 mL.  Performed by: Personally  Anesthesiologist: Audry Pili, MD  Additional Notes: No pain on injection. No increased resistance to injection. Injection made in 5cc increments. Good needle visualization. Patient tolerated the procedure well.

## 2019-12-10 NOTE — Transfer of Care (Signed)
Immediate Anesthesia Transfer of Care Note  Patient: Xavier Bell  Procedure(s) Performed: Open Reduction Internal fixation (ORIF) Left Ankle Fracture (Left Ankle)  Patient Location: PACU  Anesthesia Type:General  Level of Consciousness: sedated  Airway & Oxygen Therapy: Patient Spontanous Breathing and Patient connected to face mask oxygen  Post-op Assessment: Report given to RN and Post -op Vital signs reviewed and stable  Post vital signs: Reviewed and stable  Last Vitals:  Vitals Value Taken Time  BP 118/58 12/10/19 1730  Temp    Pulse 55 12/10/19 1731  Resp 14 12/10/19 1731  SpO2 99 % 12/10/19 1731  Vitals shown include unvalidated device data.  Last Pain:  Vitals:   12/10/19 1345  TempSrc: Temporal  PainSc: 0-No pain         Complications: No apparent anesthesia complications

## 2019-12-10 NOTE — Progress Notes (Signed)
Assisted Dr. Fransisco Beau with left, ultrasound guided, popliteal/saphenous, adductor canal block. Side rails up, monitors on throughout procedure. See vital signs in flow sheet. Tolerated Procedure well.

## 2019-12-10 NOTE — Anesthesia Procedure Notes (Signed)
Anesthesia Regional Block: Popliteal block   Pre-Anesthetic Checklist: ,, timeout performed, Correct Patient, Correct Site, Correct Laterality, Correct Procedure, Correct Position, site marked, Risks and benefits discussed,  Surgical consent,  Pre-op evaluation,  At surgeon's request and post-op pain management  Laterality: Left  Prep: chloraprep       Needles:  Injection technique: Single-shot  Needle Type: Echogenic Needle     Needle Length: 10cm  Needle Gauge: 21     Additional Needles:   Narrative:  Start time: 12/10/2019 2:13 PM End time: 12/10/2019 2:19 PM Injection made incrementally with aspirations every 5 mL.  Performed by: Personally  Anesthesiologist: Audry Pili, MD  Additional Notes: No pain on injection. No increased resistance to injection. Injection made in 5cc increments. Good needle visualization. Patient tolerated the procedure well.

## 2019-12-10 NOTE — Op Note (Signed)
12/10/2019  5:28 PM  PATIENT:  Xavier Bell  78 y.o. male  PRE-OPERATIVE DIAGNOSIS: 1.  Left ankle bimalleolar fracture  POST-OPERATIVE DIAGNOSIS:   1.  Left ankle bimalleolar fracture      2.  Left ankle syndesmosis disruption  Procedure(s): 1.  Open treatment left ankle bimalleolar fracture with internal fixation   2.  Stress exam of left ankle under fluoro   3.  Open treatment of left ankle syndesmosis disruption with internal fixation   4.  AP, mortise and lateral xrays of the left ankle  SURGEON:  [name], MD  ASSISTANT: none  ANESTHESIA:   General, regional  EBL:  minimal   TOURNIQUET:  < 1 hour at [id number] mm Hg  COMPLICATIONS:  None apparent  DISPOSITION:  Extubated, awake and stable to recovery.  INDICATION FOR PROCEDURE: The patient is an 78 year old male with a past medical history significant for coronary artery disease.  He fell through the ceiling of his attic injuring his left ankle 2 days ago.  Radiographs revealed a Weber C bimalleolar ankle fracture.  He presents today for open treatment of this displaced and unstable left ankle injury.  The risks and benefits of the alternative treatment options have been discussed in detail.  The patient wishes to proceed with surgery and specifically understands risks of bleeding, infection, nerve damage, blood clots, need for additional surgery, amputation and death.  PROCEDURE IN DETAIL:  After pre operative consent was obtained, and the correct operative site was identified, the patient was brought to the operating room and placed supine on the OR table.  Anesthesia was administered.  Pre-operative antibiotics were administered.  A surgical timeout was taken.  The left lower extremity was prepped and draped in standard sterile fashion with a tourniquet around the thigh.  The extremity was elevated and the tourniquet was inflated to 250 mmHg.  A longitudinal incision was made over the lateral malleolus.  Dissection was  carried down through the subcutaneous tissues.  Superficial peroneal nerve was identified.  It was retracted anteriorly and protected throughout the case.  The fracture site was identified.  There was significant comminution noted.  A 10 hole [employer] composite plate was selected.  It was contoured to fit the lateral malleolus.  It was provisionally pinned distally.  Radiographs confirmed appropriate position of the plate.  The third most proximal hole was drilled and a nonlocking screw was inserted.  This pulled the plate securely down to the bone.  The distal 2 holes were then drilled and filled with locking screws.  The plate was then used as a reduction tool.  The fracture site was pulled out to length.  The plate was secured proximally with a clamp.  Radiographs confirmed appropriate restoration of the length of the fibula and appropriate reduction of the fracture site.  The plate was then secured proximally with 3 bicortical screws.  The distal nonlocking screw was exchanged for a locking screw.  Attention was turned to the medial aspect the ankle.  The previous CABG graft incision was incorporated and then extended distally to the medial malleolus.  Dissection was carried down through the subcutaneous tissues.  The fracture site was identified.  It was cleaned of all hematoma and periosteum.  The fracture was reduced and provisionally held with a tenaculum.  The fracture was noted to have a significant vertical shear component.  Two 4 mm x 40 mm partially-threaded cannulated screws were inserted and were noted to compress the fracture  site appropriately.  A 2 hole one third tubular plate was then placed over the apex of the fracture site as a buttress.  It was secured proximally and distally with 3.5 mm fully threaded screws.  A stress examination of the ankle was then performed under live fluoroscopy.  Dorsiflexion and external rotation stress was applied to the supinated forefoot.  The  syndesmosis was noted to widen at the distal tib-fib joint.  The decision was made to proceed with syndesmosis fixation.  The syndesmosis was reduced.  A drill hole was made across all 4 cortices of the distal fibula and tibia at the level of the physeal scar.  A zip tight suture button device was then passed through the drill holes and toggled at the medial cortex.  The device was tightened securely holding the syndesmosis reduced appropriately.  Final AP, lateral and mortise radiographs confirmed appropriate position and length of all hardware and appropriate reduction of the syndesmosis.  Medial and lateral wounds were then irrigated copiously.  Vancomycin powder was sprinkled in both wounds.  Subcutaneous tissues were approximated with Vicryl.  Skin incisions were closed with nylon.  Sterile dressings were applied followed by a well-padded short leg splint.  The tourniquet was released after application of the dressings.  Patient was awakened from anesthesia and transported to the recovery room in stable condition.  FOLLOW UP PLAN: Nonweightbearing on the left lower extremity.  Follow-up in the office in 2 weeks for suture removal and conversion to a short leg cast.  Aspirin for DVT prophylaxis.   RADIOGRAPHS: AP, mortise and lateral radiographs of the left ankle are obtained intraoperatively.  These show interval reduction and fixation of the bimalleolar ankle fracture and the disrupted syndesmosis.  Hardware is appropriately positioned and of the appropriate lengths.  No other acute injuries are noted.

## 2019-12-10 NOTE — Discharge Instructions (Addendum)
John Hewitt, MD [facility]  Please read the following information regarding your care after surgery.  Medications  You only need a prescription for the narcotic pain medicine (ex. oxycodone, Percocet, Norco).  All of the other medicines listed below are available over the counter. X Aleve 2 pills twice a day for the first 3 days after surgery. X acetominophen (Tylenol) 650 mg every 4-6 hours as you need for minor to moderate pain X oxycodone as prescribed for severe pain  Narcotic pain medicine (ex. oxycodone, Percocet, Vicodin) will cause constipation.  To prevent this problem, take the following medicines while you are taking any pain medicine. X docusate sodium (Colace) 100 mg twice a day X senna (Senokot) 2 tablets twice a day  X To help prevent blood clots, take a baby aspirin (81 mg) twice a day for two weeks after surgery.  You should also get up every hour while you are awake to move around.    Weight Bearing ? Bear weight when you are able on your operated leg or foot. ? Bear weight only on your operated foot in the post-op shoe. X Do not bear any weight on the operated leg or foot.  Cast / Splint / Dressing X Keep your splint, cast or dressing clean and dry.  Don't put anything (coat hanger, pencil, etc) down inside of it.  If it gets damp, use a hair dryer on the cool setting to dry it.  If it gets soaked, call the office to schedule an appointment for a cast change. ? Remove your dressing 3 days after surgery and cover the incisions with dry dressings.    After your dressing, cast or splint is removed; you may shower, but do not soak or scrub the wound.  Allow the water to run over it, and then gently pat it dry.  Swelling It is normal for you to have swelling where you had surgery.  To reduce swelling and pain, keep your toes above your nose for at least 3 days after surgery.  It may be necessary to keep your foot or leg elevated for several weeks.  If it hurts, it should be  elevated.  Follow Up Call my office at [phone] when you are discharged from the hospital or surgery center to schedule an appointment to be seen two weeks after surgery.  Call my office at [phone] if you develop a fever >101.5 F, nausea, vomiting, bleeding from the surgical site or severe pain.     Post Anesthesia Home Care Instructions  Activity: Get plenty of rest for the remainder of the day. A responsible individual must stay with you for 24 hours following the procedure.  For the next 24 hours, DO NOT: -Drive a car -Operate machinery -Drink alcoholic beverages -Take any medication unless instructed by your physician -Make any legal decisions or sign important papers.  Meals: Start with liquid foods such as gelatin or soup. Progress to regular foods as tolerated. Avoid greasy, spicy, heavy foods. If nausea and/or vomiting occur, drink only clear liquids until the nausea and/or vomiting subsides. Call your physician if vomiting continues.  Special Instructions/Symptoms: Your throat may feel dry or sore from the anesthesia or the breathing tube placed in your throat during surgery. If this causes discomfort, gargle with warm salt water. The discomfort should disappear within 24 hours.  If you had a scopolamine patch placed behind your ear for the management of post- operative nausea and/or vomiting:  1. The medication in the patch is   effective for 72 hours, after which it should be removed.  Wrap patch in a tissue and discard in the trash. Wash hands thoroughly with soap and water. 2. You may remove the patch earlier than 72 hours if you experience unpleasant side effects which may include dry mouth, dizziness or visual disturbances. 3. Avoid touching the patch. Wash your hands with soap and water after contact with the patch.    Regional Anesthesia Blocks  1. Numbness or the inability to move the "blocked" extremity may last from 3-48 hours after placement. The length  of time depends on the medication injected and your individual response to the medication. If the numbness is not going away after 48 hours, call your surgeon.  2. The extremity that is blocked will need to be protected until the numbness is gone and the  Strength has returned. Because you cannot feel it, you will need to take extra care to avoid injury. Because it may be weak, you may have difficulty moving it or using it. You may not know what position it is in without looking at it while the block is in effect.  3. For blocks in the legs and feet, returning to weight bearing and walking needs to be done carefully. You will need to wait until the numbness is entirely gone and the strength has returned. You should be able to move your leg and foot normally before you try and bear weight or walk. You will need someone to be with you when you first try to ensure you do not fall and possibly risk injury.  4. Bruising and tenderness at the needle site are common side effects and will resolve in a few days.  5. Persistent numbness or new problems with movement should be communicated to the surgeon or the [facility] ([phone])/ [facility] ([phone]). 

## 2019-12-10 NOTE — H&P (Signed)
Xavier Bell is an 78 y.o. male.   Chief Complaint: Left ankle pain HPI: The patient is an 78 year old male with past medical history significant for coronary artery disease.  He fell in his attic 2 days ago injuring his left ankle.  Radiographs in the emergency room showed a displaced bimalleolar ankle fracture.  He presents now for operative treatment of this displaced and unstable left ankle injury.  Past Medical History:  Diagnosis Date  . Atrial flutter ([place])    s/p cardioversion 02/08/12; ablation by [name] [phone]  . Chronic anticoagulation   . Coronary artery disease    s/p remote MI in 1994 with multiple PCI and subsequent CABG x 5 in 1996  . Dyslipidemia   . Hypertension   . MI (myocardial infarction) ([place]) 1995  . Normal nuclear stress test 2006  . Pseudoaphakia    both eyes  . Retinal detachment     Past Surgical History:  Procedure Laterality Date  . ATRIAL FLUTTER ABLATION N/A 12/08/2014   CTI by [name]  . CARDIAC CATHETERIZATION  1996  . CARDIOVERSION  02/08/2012   Procedure: CARDIOVERSION;  Surgeon: Darden Amber., MD;  Location: [facility] OR;  Service: Cardiovascular;  Laterality: N/A;  . CORONARY ARTERY BYPASS GRAFT  1996   LIMA to LAD, SVG to RV marginal & PD, SVG  to DX and SVG to Ramus  . TONSILLECTOMY AND ADENOIDECTOMY      Family History  Problem Relation Age of Onset  . Heart disease Mother   . Heart disease Father   . Coronary artery disease Other    Social History:  reports that he has never smoked. He has never used smokeless tobacco. He reports that he does not drink alcohol or use drugs.  Allergies:  Allergies  Allergen Reactions  . Brimonidine     unknown  . Niacin And Related Other (See Comments)    Stomach Pain and flushing     Medications Prior to Admission  Medication Sig Dispense Refill  . amLODipine (NORVASC) 10 MG tablet Take 1 tablet (10 mg total) by mouth daily. 90 tablet 3  . aspirin 81 MG tablet Take 81 mg by mouth  daily.    [name] atorvastatin (LIPITOR) 40 MG tablet Take 1 tablet (40 mg total) by mouth daily. 30 tablet 0  . carvedilol (COREG) 25 MG tablet Take 1 tablet (25 mg total) by mouth 2 (two) times daily. 180 tablet 3  . Cholecalciferol (VITAMIN D) 2000 units tablet Take 2,000 Units by mouth daily.    . dorzolamide-timolol (COSOPT) 22.3-6.8 MG/ML ophthalmic solution Place 2 drops into both eyes 2 (two) times daily.    . fenofibrate (TRICOR) 145 MG tablet Take 1 tablet (145 mg total) by mouth daily. 90 tablet 3  . hydrochlorothiazide (HYDRODIURIL) 25 MG tablet Take 1 tablet (25 mg total) by mouth daily. 90 tablet 3  . latanoprost (XALATAN) 0.005 % ophthalmic solution Place 1 drop into both eyes at bedtime.     [name] losartan (COZAAR) 100 MG tablet Take 1 tablet (100 mg total) by mouth daily. 90 tablet 3  . Multiple Vitamin (MULTIVITAMIN) tablet Take 1 tablet by mouth daily.      . Omega-3 Fatty Acids (OMEGA 3 PO) Take 1 capsule by mouth daily.    . potassium chloride SA (K-DUR,KLOR-CON) 20 MEQ tablet Take 1 tablet (20 mEq total) by mouth daily. 90 tablet 3  . Zinc 50 MG CAPS Take 50 mg by mouth daily.  Results for orders placed or performed during the hospital encounter of 2019/12/20 (from the past 48 hour(s))  Basic metabolic panel     Status: Abnormal   Collection Time: Dec 20, 2019  1:35 PM  Result Value Ref Range   Sodium 138 135 - 145 mmol/L   Potassium 6.0 (H) 3.5 - 5.1 mmol/L    Comment: HEMOLYSIS AT THIS LEVEL MAY AFFECT RESULT   Chloride 107 98 - 111 mmol/L   CO2 21 (L) 22 - 32 mmol/L   Glucose, Bld 103 (H) 70 - 99 mg/dL   BUN 27 (H) 8 - 23 mg/dL   Creatinine, Ser 1.24 0.61 - 1.24 mg/dL   Calcium 9.1 8.9 - 10.3 mg/dL   GFR calc non Af Amer 55 (L) >60 mL/min   GFR calc Af Amer >60 >60 mL/min   Anion gap 10 5 - 15    Comment: Performed at [facility] [address], [place], [place] [address]   No results found.  Review of Systems no recent fever, chills, nausea, vomiting or  changes in his appetite.  No recent chest pain.  Blood pressure (!) 132/58, pulse (!) 57, temperature 97.8 F (36.6 C), temperature source Temporal, resp. rate 12, height 5\' 7"  (1.702 m), weight 70 kg, SpO2 100 %. Physical Exam  Well-nourished well-developed man in no apparent distress.  Alert and oriented x4.  Mood and affect are normal.  Extraocular motions are intact.  Respirations are unlabored.  Gait is nonweightbearing on the left.  The left ankle has gross valgus malalignment.  Moderate swelling about the ankle.  Skin is healthy and intact.  Pulses are palpable in the foot.  No lymphadenopathy.  Sensibility to light touch is intact dorsally and plantarly at the forefoot.  Assessment/Plan Left ankle bimalleolar fracture -to the operating room today for open treatment with internal fixation.  The risks and benefits of the alternative treatment options have been discussed in detail.  The patient wishes to proceed with surgery and specifically understands risks of bleeding, infection, nerve damage, blood clots, need for additional surgery, amputation and death.   [name], MD 12/20/19, 2:52 PM

## 2019-12-11 ENCOUNTER — Encounter: Payer: Self-pay | Admitting: *Deleted

## 2019-12-11 ENCOUNTER — Telehealth: Payer: Self-pay | Admitting: Cardiovascular Disease

## 2019-12-11 MED ORDER — HYDROCHLOROTHIAZIDE 25 MG PO TABS: 25.0000 mg | ORAL_TABLET | Freq: Every day | ORAL | 3 refills | Status: DC

## 2019-12-11 NOTE — Telephone Encounter (Signed)
Returned call to pt to let him know that we have sent in his rx for HCTZ to [place], [place]. No answer / no voicemail.

## 2019-12-11 NOTE — Telephone Encounter (Signed)
Patient is returning phone call. He states no callback is necessary, he is going to pickup prescription.

## 2019-12-11 NOTE — Telephone Encounter (Signed)
*  STAT* If patient is at the pharmacy, call can be transferred to refill team.   1. Which medications need to be refilled? (please list name of each medication and dose if known)  A new prescription Hydrochlorothiazide sent to local pharmacy  2. Which pharmacy/location (including street and city if local pharmacy) is medication to be sent to? [facility] [place]  3. Do they need a 30 day or 90 day supply? 90 days and refills

## 2019-12-11 NOTE — Anesthesia Postprocedure Evaluation (Signed)
Anesthesia Post Note  Patient: Xavier Bell  Procedure(s) Performed: Open Reduction Internal fixation (ORIF) Left Ankle Fracture (Left Ankle)     Patient location during evaluation: PACU Anesthesia Type: General Level of consciousness: awake and alert Pain management: pain level controlled Vital Signs Assessment: post-procedure vital signs reviewed and stable Respiratory status: spontaneous breathing, nonlabored ventilation and respiratory function stable Cardiovascular status: blood pressure returned to baseline and stable Postop Assessment: no apparent nausea or vomiting Anesthetic complications: no    Last Vitals:  Vitals:   12/10/19 1815 12/10/19 1909  BP: 131/64 (!) 122/56  Pulse: 62 61  Resp: 16 18  Temp:  36.6 C  SpO2: 97% 98%    Last Pain:  Vitals:   12/10/19 1909  TempSrc:   PainSc: 0-No pain                 Audry Pili

## 2019-12-28 DIAGNOSIS — M25572 Pain in left ankle and joints of left foot: Secondary | ICD-10-CM | POA: Diagnosis not present

## 2019-12-28 DIAGNOSIS — Z4789 Encounter for other orthopedic aftercare: Secondary | ICD-10-CM | POA: Diagnosis not present

## 2020-01-14 ENCOUNTER — Other Ambulatory Visit: Payer: Self-pay | Admitting: Cardiovascular Disease

## 2020-01-14 MED ORDER — FENOFIBRATE 145 MG PO TABS: 145.0000 mg | ORAL_TABLET | Freq: Every day | ORAL | 2 refills | Status: DC

## 2020-01-25 ENCOUNTER — Other Ambulatory Visit: Payer: Self-pay | Admitting: Cardiovascular Disease

## 2020-01-25 MED ORDER — ATORVASTATIN CALCIUM 40 MG PO TABS: 40.0000 mg | ORAL_TABLET | Freq: Every day | ORAL | 2 refills | Status: DC

## 2020-01-29 DIAGNOSIS — Z4889 Encounter for other specified surgical aftercare: Secondary | ICD-10-CM | POA: Diagnosis not present

## 2020-01-29 DIAGNOSIS — S82842D Displaced bimalleolar fracture of left lower leg, subsequent encounter for closed fracture with routine healing: Secondary | ICD-10-CM | POA: Diagnosis not present

## 2020-01-29 DIAGNOSIS — M25572 Pain in left ankle and joints of left foot: Secondary | ICD-10-CM | POA: Diagnosis not present

## 2020-02-01 ENCOUNTER — Other Ambulatory Visit: Payer: Self-pay | Admitting: Cardiovascular Disease

## 2020-02-01 MED ORDER — HYDROCHLOROTHIAZIDE 25 MG PO TABS: 25.0000 mg | ORAL_TABLET | Freq: Every day | ORAL | 2 refills | Status: DC

## 2020-02-15 ENCOUNTER — Ambulatory Visit: Payer: PPO | Admitting: Cardiovascular Disease

## 2020-02-15 ENCOUNTER — Other Ambulatory Visit: Payer: Self-pay

## 2020-02-15 ENCOUNTER — Encounter: Payer: Self-pay | Admitting: Cardiovascular Disease

## 2020-02-15 ENCOUNTER — Other Ambulatory Visit: Payer: PPO | Admitting: *Deleted

## 2020-02-15 VITALS — BP 160/56 | HR 60 | Ht 67.0 in | Wt 164.0 lb

## 2020-02-15 DIAGNOSIS — I2510 Atherosclerotic heart disease of native coronary artery without angina pectoris: Secondary | ICD-10-CM

## 2020-02-15 DIAGNOSIS — I779 Disorder of arteries and arterioles, unspecified: Secondary | ICD-10-CM | POA: Diagnosis not present

## 2020-02-15 DIAGNOSIS — I10 Essential (primary) hypertension: Secondary | ICD-10-CM

## 2020-02-15 DIAGNOSIS — E782 Mixed hyperlipidemia: Secondary | ICD-10-CM

## 2020-02-15 LAB — BASIC METABOLIC PANEL
BUN/Creatinine Ratio: 16 (ref 10–24)
BUN: 21 mg/dL (ref 8–27)
CO2: 21 mmol/L (ref 20–29)
Calcium: 10 mg/dL (ref 8.6–10.2)
Chloride: 104 mmol/L (ref 96–106)
Creatinine, Ser: 1.3 mg/dL — ABNORMAL HIGH (ref 0.76–1.27)
GFR calc Af Amer: 60 mL/min/{1.73_m2} (ref >59)
GFR calc non Af Amer: 52 mL/min/{1.73_m2} — ABNORMAL LOW (ref >59)
Glucose: 107 mg/dL — ABNORMAL HIGH (ref 65–99)
Potassium: 4.5 mmol/L (ref 3.5–5.2)
Sodium: 141 mmol/L (ref 134–144)

## 2020-02-15 LAB — HEPATIC FUNCTION PANEL
ALT: 23 IU/L (ref 0–44)
AST: 32 IU/L (ref 0–40)
Albumin: 4.8 g/dL — ABNORMAL HIGH (ref 3.7–4.7)
Alkaline Phosphatase: 77 IU/L (ref 39–117)
Bilirubin Total: 0.7 mg/dL (ref 0.0–1.2)
Bilirubin, Direct: 0.24 mg/dL (ref 0.00–0.40)
Total Protein: 7 g/dL (ref 6.0–8.5)

## 2020-02-15 LAB — LIPID PANEL
Chol/HDL Ratio: 3.6 ratio (ref 0.0–5.0)
Cholesterol, Total: 127 mg/dL (ref 100–199)
HDL: 35 mg/dL — ABNORMAL LOW (ref >39)
LDL Chol Calc (NIH): 67 mg/dL (ref 0–99)
Triglycerides: 141 mg/dL (ref 0–149)
VLDL Cholesterol Cal: 25 mg/dL (ref 5–40)

## 2020-02-15 MED ORDER — CHLORTHALIDONE 25 MG PO TABS: 25.0000 mg | ORAL_TABLET | Freq: Every day | ORAL | 3 refills | Status: DC

## 2020-02-15 NOTE — Progress Notes (Signed)
Cardiology Office Note   Date:  02/15/2020   ID:  Xavier Bell, Xavier Bell 1940-12-28, MRN [id number]  PCP:  Xavier Pollen, MD  Cardiologist:   Xavier Moores, MD   Chief Complaint  Patient presents with  . Coronary Artery Disease  . Hyperlipidemia   1. Coronary artery disease-status post CABG 2. Atrial flutter- s/p cardioversion 02/08/12 3. Hypertension 4. Right carotid bruit     Has a history of atrial flutter this past spring. He status post cardioversion. [name] done very well. He continues to take Xarelto. [name] not been exercising nearly as much as he needs to. [name] also been careless with his diet and has picked up a few pounds.  March 06, 2013:  He is doing well. No angina. He is continuing to have foot problems - especially of the left foot. He has intense pain in his heel when he walks.  Sept. 12, 2014:  He has done well. Still not exercising as much. Takes motrin at night. He has left plantar faciatis.   March 19, 2014:  Xavier Bell is doing ok No CP or dyspnea. No palpitations. He has gone back into atrial flutter. He cannot tell that his HR is irreg.   He has a hx of a-flutter in the past ( 2014) and was treated with Xarelto and was cardioverted.   May 09, 2015 Xavier Bell is a 79 y.o. male who presents for follow up of his atrial flutter , HTN, CAD No CP , not exercising much .   Nov. 14, 2016: Doing well. BP is a bit high. Not much exercise ,  Sore feet.   May 28, 2016:  Doing well.  BP is normal  No CP , no dyspnea at rest.   Slight DOE with activity. Not exercising   Jan. 8, 2018:  Xavier Bell is seen today .   Doing well.   Not exercising much .   Knows that he needs to.  Aug. 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  No CP , no dyspnea   April 21, 2018:  Xavier Bell is seen back today for follow-up of his coronary artery disease and mild to moderate carotid artery disease.  Recent carotid duplex scan reveals moderate left  internal carotid plaque and mild right internal carotid disease.  Hx of hyperlipidemia ,    Had some back issues  - has 3 bulging discs, 1 was herniated  Back has improved some with conservative management .   Does not think he will need surgery any time soon.   No CP or dyspnea   November 03, 2018: She is seen today for follow-up of his coronary artery disease and moderate carotid artery disease.  Is a moderate left internal carotid plaque.  He has mild right internal carotid disease.  Is planning on selling his Xavier Bell  No CP or dyspnea Bp is a bit elevated  Has been eating more salt than he should   Aug. 19, 2020 :  Xavier Bell is seen today for follow up visit Has a hx of atrial flutter - is s/p ablation  .   - is in NSR  Today  BP is a bit elevated.  Has gained a few lbs.   February 15, 2020:  Xavier Bell is seen today for follow-up visit.  He has a history of coronary artery disease and is status post coronary artery bypass grafting.  He has moderate carotid artery disease.  Said a history of atrial flutter and  is status post ablation. Fell through his ceiling from the attic.   Broke his left ankle ,  Had surgical repair of his ankle  Otherwise doing ok No covid vaccine yet.     Past Medical History:  Diagnosis Date  . Atrial flutter ([place])    s/p cardioversion 02/08/12; ablation by Dr Xavier Bell [phone]  . Chronic anticoagulation   . Coronary artery disease    s/p remote MI in 1994 with multiple PCI and subsequent CABG x 5 in 1996  . Dyslipidemia   . Hypertension   . MI (myocardial infarction) ([place]) 1995  . Normal nuclear stress test 2006  . Pseudoaphakia    both eyes  . Retinal detachment     Past Surgical History:  Procedure Laterality Date  . ATRIAL FLUTTER ABLATION N/A 12/08/2014   CTI by Dr Xavier Bell  . CARDIAC CATHETERIZATION  1996  . CARDIOVERSION  02/08/2012   Procedure: CARDIOVERSION;  Surgeon: Xavier Bell., MD;  Location: [facility] OR;  Service: Cardiovascular;   Laterality: N/A;  . CORONARY ARTERY BYPASS GRAFT  1996   LIMA to LAD, SVG to RV marginal & PD, SVG  to DX and SVG to Ramus  . ORIF ANKLE FRACTURE Left 12/10/2019   Procedure: Open Reduction Internal fixation (ORIF) Left Ankle Fracture;  Surgeon: Xavier Simmer, MD;  Location: [facility];  Service: Orthopedics;  Laterality: Left;  . TONSILLECTOMY AND ADENOIDECTOMY       Current Outpatient Medications  Medication Sig Dispense Refill  . amLODipine (NORVASC) 10 MG tablet Take 1 tablet (10 mg total) by mouth daily. 90 tablet 3  . aspirin 81 MG tablet Take 81 mg by mouth daily.    [name] atorvastatin (LIPITOR) 40 MG tablet Take 1 tablet (40 mg total) by mouth daily. 90 tablet 2  . carvedilol (COREG) 25 MG tablet Take 1 tablet (25 mg total) by mouth 2 (two) times daily. 180 tablet 3  . Cholecalciferol (VITAMIN D) 2000 units tablet Take 2,000 Units by mouth daily.    . dorzolamide-timolol (COSOPT) 22.3-6.8 MG/ML ophthalmic solution Place 2 drops into both eyes 2 (two) times daily.    . fenofibrate (TRICOR) 145 MG tablet Take 1 tablet (145 mg total) by mouth daily. Please keep upcoming appt in February with Dr. Acie Bell for future refills. Thank you 90 tablet 2  . ibuprofen (ADVIL) 200 MG tablet Take 200 mg by mouth every 6 (six) hours as needed. 200-400mg  as needed    . latanoprost (XALATAN) 0.005 % ophthalmic solution Place 1 drop into both eyes at bedtime.     [name] losartan (COZAAR) 100 MG tablet Take 1 tablet (100 mg total) by mouth daily. 90 tablet 3  . Multiple Vitamin (MULTIVITAMIN) tablet Take 1 tablet by mouth daily.      . Omega-3 Fatty Acids (OMEGA 3 PO) Take 1 capsule by mouth daily.    . potassium chloride SA (K-DUR,KLOR-CON) 20 MEQ tablet Take 1 tablet (20 mEq total) by mouth daily. 90 tablet 3  . Zinc 50 MG CAPS Take 50 mg by mouth daily.     . chlorthalidone (HYGROTON) 25 MG tablet Take 1 tablet (25 mg total) by mouth daily. 90 tablet 3   No current facility-administered  medications for this visit.    Allergies:   Brimonidine and Niacin and related    Social History:  The patient  reports that he has never smoked. He has never used smokeless tobacco. He reports that he does not drink alcohol or  use drugs.   Family History:  The patient's family history includes Coronary artery disease in an other family member; Heart disease in his father and mother.    ROS:  Noted in current hx , otherwise , ROS is negative   Physical Exam: Blood pressure (!) 160/56, pulse 60, height 5\' 7"  (1.702 m), weight 164 lb (74.4 kg), SpO2 98 %.  GEN:  Well nourished, well developed in no acute distress HEENT: Normal NECK: No JVD; R carotid bruit  LYMPHATICS: No lymphadenopathy CARDIAC: RRR   RESPIRATORY:  Clear to auscultation without rales, wheezing or rhonchi  ABDOMEN: Soft, non-tender, non-distended MUSCULOSKELETAL:  No edema; No deformity  SKIN: Warm and dry NEUROLOGIC:  Alert and oriented x 3    EKG:     Recent Labs: 02/15/2020: ALT 23; BUN 21; Creatinine, Ser 1.30; Potassium 4.5; Sodium 141    Lipid Panel    Component Value Date/Time   CHOL 127 02/15/2020 0852   TRIG 141 02/15/2020 0852   HDL 35 (L) 02/15/2020 0852   CHOLHDL 3.6 02/15/2020 0852   CHOLHDL 2.7 05/28/2016 0816   VLDL 16 05/28/2016 0816   LDLCALC 67 02/15/2020 0852      Wt Readings from Last 3 Encounters:  02/15/20 164 lb (74.4 kg)  12/10/19 154 lb 5.2 oz (70 kg)  08/12/19 165 lb 12.8 oz (75.2 kg)      Other studies Reviewed: Additional studies/ records that were reviewed today include: . Review of the above records demonstrates:    ASSESSMENT AND PLAN:  1. Coronary artery disease-  No angina .     2. Atrial flutter  -   In NSR   3. Hypertensive heart disease without CHF - BP is elevated.   Does not take it at home  Is in a walking cast on left ankle.    Has gained some weight  Is not exercising  We will stop his hydrochlorothiazide and start him on chlorthalidone 25 mg  a day.  We will check basic metabolic profile in 3 months.  I have advised him to work on salt restriction.  He is not able to exercise because of his broken left ankle but hopefully this will be resolved soon.  4. Right carotid bruit-   Stable  - mild mod carotid disease.  Recheck in 4-6 months ( 1 year after last study )   5. Hyperlipidemia:    Will check in 6 months     Current medicines are reviewed at length with the patient today.  The patient does not have concerns regarding medicines.  The following changes have been made:  no change  Labs/ tests ordered today include:   Orders Placed This Encounter  Procedures  . Basic Metabolic Panel (BMET)  . Basic Metabolic Panel (BMET)  . Hepatic function panel  . Lipid Profile  . VAS US CAROTID     Disposition:   FU with me in 6 months .   Will get fasting labs today .      Xavier Moores, MD  02/15/2020 5:46 PM    [facility] [address], [place],   [address] Phone: [phone]; Fax: [phone]

## 2020-02-15 NOTE — Patient Instructions (Addendum)
Medication Instructions:  Your physician has recommended you make the following change in your medication:  STOP HCTZ (hydrochlorothiazide)  START Chlorthalidone 25 mg once daily in the morning  *If you need a refill on your cardiac medications before your next appointment, please call your pharmacy*  Lab Work: Your physician recommends that you return for lab work in: 3 weeks for basic metabolic panel  Your physician recommends that you return for lab work in: 6 months on the day of or a few days before your office visit.  You will need to FAST for this appointment - nothing to eat or drink after midnight the night before except water.   If you have labs (blood work) drawn today and your tests are completely normal, you will receive your results only by: [name] MyChart Message (if you have MyChart) OR . A paper copy in the mail If you have any lab test that is abnormal or we need to change your treatment, we will call you to review the results.   Testing/Procedures: Your physician has requested that you have a carotid duplex in 6 months before your office visit. This test is an ultrasound of the carotid arteries in your neck. It looks at blood flow through these arteries that supply the brain with blood. Allow one hour for this exam. There are no restrictions or special instructions.    Follow-Up: At [facility], you and your health needs are our priority.  As part of our continuing mission to provide you with exceptional heart care, we have created designated Provider Care Teams.  These Care Teams include your primary Cardiologist (physician) and Advanced Practice Providers (APPs -  Physician Assistants and Nurse Practitioners) who all work together to provide you with the care you need, when you need it.  Your next appointment:   6 month(s)  The format for your next appointment:   In Person  Provider:   You may see Mertie Moores, MD or one of the following Advanced Practice  Providers on your designated Care Team:    Richardson Dopp, PA-C  Vin Little [place], [place]  Daune Perch, NP   Other Instructions Omron BP cuff Memory on BP machine is not needed.

## 2020-02-26 DIAGNOSIS — Z4789 Encounter for other orthopedic aftercare: Secondary | ICD-10-CM | POA: Diagnosis not present

## 2020-02-26 DIAGNOSIS — S82842D Displaced bimalleolar fracture of left lower leg, subsequent encounter for closed fracture with routine healing: Secondary | ICD-10-CM | POA: Diagnosis not present

## 2020-02-26 DIAGNOSIS — M25572 Pain in left ankle and joints of left foot: Secondary | ICD-10-CM | POA: Diagnosis not present

## 2020-03-09 DIAGNOSIS — R269 Unspecified abnormalities of gait and mobility: Secondary | ICD-10-CM | POA: Diagnosis not present

## 2020-03-09 DIAGNOSIS — M25572 Pain in left ankle and joints of left foot: Secondary | ICD-10-CM | POA: Diagnosis not present

## 2020-03-10 DIAGNOSIS — M25572 Pain in left ankle and joints of left foot: Secondary | ICD-10-CM | POA: Insufficient documentation

## 2020-03-14 ENCOUNTER — Other Ambulatory Visit: Payer: Self-pay

## 2020-03-14 ENCOUNTER — Other Ambulatory Visit: Payer: PPO | Admitting: *Deleted

## 2020-03-14 DIAGNOSIS — E782 Mixed hyperlipidemia: Secondary | ICD-10-CM | POA: Diagnosis not present

## 2020-03-14 DIAGNOSIS — I10 Essential (primary) hypertension: Secondary | ICD-10-CM

## 2020-03-14 DIAGNOSIS — I779 Disorder of arteries and arterioles, unspecified: Secondary | ICD-10-CM | POA: Diagnosis not present

## 2020-03-14 DIAGNOSIS — I2510 Atherosclerotic heart disease of native coronary artery without angina pectoris: Secondary | ICD-10-CM | POA: Diagnosis not present

## 2020-03-14 LAB — BASIC METABOLIC PANEL
BUN/Creatinine Ratio: 25 — ABNORMAL HIGH (ref 10–24)
BUN: 34 mg/dL — ABNORMAL HIGH (ref 8–27)
CO2: 22 mmol/L (ref 20–29)
Calcium: 9.7 mg/dL (ref 8.6–10.2)
Chloride: 105 mmol/L (ref 96–106)
Creatinine, Ser: 1.35 mg/dL — ABNORMAL HIGH (ref 0.76–1.27)
GFR calc Af Amer: 57 mL/min/{1.73_m2} — ABNORMAL LOW (ref >59)
GFR calc non Af Amer: 49 mL/min/{1.73_m2} — ABNORMAL LOW (ref >59)
Glucose: 109 mg/dL — ABNORMAL HIGH (ref 65–99)
Potassium: 4.2 mmol/L (ref 3.5–5.2)
Sodium: 143 mmol/L (ref 134–144)

## 2020-03-15 DIAGNOSIS — R269 Unspecified abnormalities of gait and mobility: Secondary | ICD-10-CM | POA: Diagnosis not present

## 2020-03-15 DIAGNOSIS — M25572 Pain in left ankle and joints of left foot: Secondary | ICD-10-CM | POA: Diagnosis not present

## 2020-03-17 ENCOUNTER — Telehealth: Payer: Self-pay | Admitting: Cardiovascular Disease

## 2020-03-17 ENCOUNTER — Encounter: Payer: Self-pay | Admitting: Nurse Practitioner

## 2020-03-17 DIAGNOSIS — M25572 Pain in left ankle and joints of left foot: Secondary | ICD-10-CM | POA: Diagnosis not present

## 2020-03-17 DIAGNOSIS — R269 Unspecified abnormalities of gait and mobility: Secondary | ICD-10-CM | POA: Diagnosis not present

## 2020-03-17 NOTE — Telephone Encounter (Signed)
   Pt returning call regarding his lab results. He said he have some questions.  Please call

## 2020-03-17 NOTE — Telephone Encounter (Signed)
Reviewed lab results with patient and advised him to continue current medications. He states he is monitoring BP at home and keeping records.  I advised him to call back if he has concerns prior to his follow-up appointment. He thanked me for the call.

## 2020-03-21 ENCOUNTER — Other Ambulatory Visit: Payer: PPO

## 2020-03-23 ENCOUNTER — Other Ambulatory Visit: Payer: Self-pay

## 2020-03-23 DIAGNOSIS — R269 Unspecified abnormalities of gait and mobility: Secondary | ICD-10-CM | POA: Diagnosis not present

## 2020-03-23 DIAGNOSIS — M25572 Pain in left ankle and joints of left foot: Secondary | ICD-10-CM | POA: Diagnosis not present

## 2020-03-23 MED ORDER — CARVEDILOL 25 MG PO TABS: 25.0000 mg | ORAL_TABLET | Freq: Two times a day (BID) | ORAL | 3 refills | Status: DC

## 2020-03-23 MED ORDER — AMLODIPINE BESYLATE 10 MG PO TABS: 10.0000 mg | ORAL_TABLET | Freq: Every day | ORAL | 3 refills | Status: DC

## 2020-03-24 ENCOUNTER — Other Ambulatory Visit: Payer: Self-pay | Admitting: Cardiovascular Disease

## 2020-03-28 DIAGNOSIS — M79672 Pain in left foot: Secondary | ICD-10-CM | POA: Diagnosis not present

## 2020-03-28 DIAGNOSIS — M79671 Pain in right foot: Secondary | ICD-10-CM | POA: Diagnosis not present

## 2020-03-28 DIAGNOSIS — S82842D Displaced bimalleolar fracture of left lower leg, subsequent encounter for closed fracture with routine healing: Secondary | ICD-10-CM | POA: Diagnosis not present

## 2020-03-28 DIAGNOSIS — M25572 Pain in left ankle and joints of left foot: Secondary | ICD-10-CM | POA: Diagnosis not present

## 2020-03-28 DIAGNOSIS — M25571 Pain in right ankle and joints of right foot: Secondary | ICD-10-CM | POA: Diagnosis not present

## 2020-03-29 DIAGNOSIS — R269 Unspecified abnormalities of gait and mobility: Secondary | ICD-10-CM | POA: Diagnosis not present

## 2020-03-29 DIAGNOSIS — M25572 Pain in left ankle and joints of left foot: Secondary | ICD-10-CM | POA: Diagnosis not present

## 2020-03-30 DIAGNOSIS — H401122 Primary open-angle glaucoma, left eye, moderate stage: Secondary | ICD-10-CM | POA: Diagnosis not present

## 2020-03-30 DIAGNOSIS — H401111 Primary open-angle glaucoma, right eye, mild stage: Secondary | ICD-10-CM | POA: Diagnosis not present

## 2020-03-31 DIAGNOSIS — R269 Unspecified abnormalities of gait and mobility: Secondary | ICD-10-CM | POA: Diagnosis not present

## 2020-03-31 DIAGNOSIS — M25572 Pain in left ankle and joints of left foot: Secondary | ICD-10-CM | POA: Diagnosis not present

## 2020-04-04 DIAGNOSIS — H401122 Primary open-angle glaucoma, left eye, moderate stage: Secondary | ICD-10-CM | POA: Diagnosis not present

## 2020-04-04 DIAGNOSIS — H401111 Primary open-angle glaucoma, right eye, mild stage: Secondary | ICD-10-CM | POA: Diagnosis not present

## 2020-04-05 DIAGNOSIS — M25572 Pain in left ankle and joints of left foot: Secondary | ICD-10-CM | POA: Diagnosis not present

## 2020-04-05 DIAGNOSIS — R269 Unspecified abnormalities of gait and mobility: Secondary | ICD-10-CM | POA: Diagnosis not present

## 2020-04-07 DIAGNOSIS — R269 Unspecified abnormalities of gait and mobility: Secondary | ICD-10-CM | POA: Diagnosis not present

## 2020-04-07 DIAGNOSIS — M25572 Pain in left ankle and joints of left foot: Secondary | ICD-10-CM | POA: Diagnosis not present

## 2020-04-11 ENCOUNTER — Other Ambulatory Visit: Payer: Self-pay

## 2020-04-11 MED ORDER — LOSARTAN POTASSIUM 100 MG PO TABS: 100.0000 mg | ORAL_TABLET | Freq: Every day | ORAL | 3 refills | Status: DC

## 2020-04-12 DIAGNOSIS — M25572 Pain in left ankle and joints of left foot: Secondary | ICD-10-CM | POA: Diagnosis not present

## 2020-04-12 DIAGNOSIS — R269 Unspecified abnormalities of gait and mobility: Secondary | ICD-10-CM | POA: Diagnosis not present

## 2020-04-15 DIAGNOSIS — R269 Unspecified abnormalities of gait and mobility: Secondary | ICD-10-CM | POA: Diagnosis not present

## 2020-04-15 DIAGNOSIS — M25572 Pain in left ankle and joints of left foot: Secondary | ICD-10-CM | POA: Diagnosis not present

## 2020-04-28 ENCOUNTER — Other Ambulatory Visit (HOSPITAL_COMMUNITY): Payer: Self-pay | Admitting: Cardiovascular Disease

## 2020-04-28 ENCOUNTER — Ambulatory Visit (HOSPITAL_COMMUNITY)
Admission: RE | Admit: 2020-04-28 | Discharge: 2020-04-28 | Disposition: A | Discharge status: Home / Self Care | Payer: PPO | Source: Ambulatory Visit | Attending: Cardiology | Admitting: Cardiology

## 2020-04-28 ENCOUNTER — Other Ambulatory Visit: Payer: Self-pay

## 2020-04-28 DIAGNOSIS — E782 Mixed hyperlipidemia: Secondary | ICD-10-CM | POA: Insufficient documentation

## 2020-04-28 DIAGNOSIS — I2510 Atherosclerotic heart disease of native coronary artery without angina pectoris: Secondary | ICD-10-CM | POA: Diagnosis not present

## 2020-04-28 DIAGNOSIS — I6523 Occlusion and stenosis of bilateral carotid arteries: Secondary | ICD-10-CM

## 2020-04-28 DIAGNOSIS — I10 Essential (primary) hypertension: Secondary | ICD-10-CM | POA: Diagnosis not present

## 2020-04-28 DIAGNOSIS — I779 Disorder of arteries and arterioles, unspecified: Secondary | ICD-10-CM | POA: Diagnosis not present

## 2020-05-02 DIAGNOSIS — H524 Presbyopia: Secondary | ICD-10-CM | POA: Diagnosis not present

## 2020-05-02 DIAGNOSIS — H52203 Unspecified astigmatism, bilateral: Secondary | ICD-10-CM | POA: Diagnosis not present

## 2020-05-02 DIAGNOSIS — H5213 Myopia, bilateral: Secondary | ICD-10-CM | POA: Diagnosis not present

## 2020-05-04 DIAGNOSIS — L570 Actinic keratosis: Secondary | ICD-10-CM | POA: Diagnosis not present

## 2020-07-11 ENCOUNTER — Telehealth: Payer: Self-pay | Admitting: Cardiovascular Disease

## 2020-07-11 NOTE — Telephone Encounter (Signed)
Spoke with the pt and informed him of Pharmacist recommendations.  Pt verbalized understanding and agrees with this plan.  He states he will avoid taking this dietary supplement all together. Pt appreciative for all the assistance provided.

## 2020-07-11 NOTE — Telephone Encounter (Signed)
Cannot really answer if this medication is safe to take with his other medications/conditions since there is very limited data/studies on these particular herbals medications and all of these studies are in animals. Cannot search drug interaction as they are not in our drug databases.  Due to lack of data I would advise against use.

## 2020-07-11 NOTE — Telephone Encounter (Signed)
Pt c/o medication issue:  1. Name of Medication: Metabolic Activator 3601 mg  2. How are you currently taking this medication (dosage and times per day)? 1 Tablet by mouth daily   3. Are you having a reaction (difficulty breathing--STAT)? No   4. What is your medication issue? [name] is calling wanting to know if it is okay for him to begin taking this dietary supplement. Please advise.

## 2020-07-11 NOTE — Telephone Encounter (Signed)
Pt is calling in inquiring if it is safe for him to start taking dietary supplement metabolic activator, with his current cardiac meds and history.  Will forward request to our Pharmacist to further review and advise on this, and triage to follow-up with the pt thereafter. Pt aware and agrees with this plan.

## 2020-08-12 ENCOUNTER — Other Ambulatory Visit: Payer: PPO | Admitting: *Deleted

## 2020-08-12 ENCOUNTER — Other Ambulatory Visit: Payer: Self-pay

## 2020-08-12 DIAGNOSIS — I10 Essential (primary) hypertension: Secondary | ICD-10-CM | POA: Diagnosis not present

## 2020-08-12 DIAGNOSIS — I779 Disorder of arteries and arterioles, unspecified: Secondary | ICD-10-CM

## 2020-08-12 DIAGNOSIS — E782 Mixed hyperlipidemia: Secondary | ICD-10-CM | POA: Diagnosis not present

## 2020-08-12 DIAGNOSIS — I2510 Atherosclerotic heart disease of native coronary artery without angina pectoris: Secondary | ICD-10-CM

## 2020-08-12 LAB — LIPID PANEL
Chol/HDL Ratio: 3.9 ratio (ref 0.0–5.0)
Cholesterol, Total: 112 mg/dL (ref 100–199)
HDL: 29 mg/dL — ABNORMAL LOW (ref >39)
LDL Chol Calc (NIH): 61 mg/dL (ref 0–99)
Triglycerides: 120 mg/dL (ref 0–149)
VLDL Cholesterol Cal: 22 mg/dL (ref 5–40)

## 2020-08-12 LAB — BASIC METABOLIC PANEL
BUN/Creatinine Ratio: 21 (ref 10–24)
BUN: 26 mg/dL (ref 8–27)
CO2: 21 mmol/L (ref 20–29)
Calcium: 9.3 mg/dL (ref 8.6–10.2)
Chloride: 105 mmol/L (ref 96–106)
Creatinine, Ser: 1.23 mg/dL (ref 0.76–1.27)
GFR calc Af Amer: 63 mL/min/{1.73_m2} (ref >59)
GFR calc non Af Amer: 55 mL/min/{1.73_m2} — ABNORMAL LOW (ref >59)
Glucose: 103 mg/dL — ABNORMAL HIGH (ref 65–99)
Potassium: 3.9 mmol/L (ref 3.5–5.2)
Sodium: 140 mmol/L (ref 134–144)

## 2020-08-12 LAB — HEPATIC FUNCTION PANEL
ALT: 27 IU/L (ref 0–44)
AST: 37 IU/L (ref 0–40)
Albumin: 4.4 g/dL (ref 3.6–4.6)
Alkaline Phosphatase: 67 IU/L (ref 48–121)
Bilirubin Total: 0.6 mg/dL (ref 0.0–1.2)
Bilirubin, Direct: 0.22 mg/dL (ref 0.00–0.40)
Total Protein: 6.3 g/dL (ref 6.0–8.5)

## 2020-08-15 ENCOUNTER — Other Ambulatory Visit: Payer: Self-pay

## 2020-08-15 ENCOUNTER — Encounter: Payer: Self-pay | Admitting: Cardiovascular Disease

## 2020-08-15 ENCOUNTER — Ambulatory Visit: Payer: PPO | Admitting: Cardiovascular Disease

## 2020-08-15 VITALS — BP 146/92 | HR 60 | Ht 67.0 in | Wt 166.2 lb

## 2020-08-15 DIAGNOSIS — I2510 Atherosclerotic heart disease of native coronary artery without angina pectoris: Secondary | ICD-10-CM | POA: Diagnosis not present

## 2020-08-15 NOTE — Progress Notes (Signed)
Cardiology Office Note   Date:  08/15/2020   ID:  Johnpaul, Gillentine 1941/08/17, MRN [id number]  PCP:  Horald Pollen, MD  Cardiologist:   Mertie Moores, MD   Chief Complaint  Patient presents with  . Coronary Artery Disease  . Hyperlipidemia   1. Coronary artery disease-status post CABG 2. Atrial flutter- s/p cardioversion 02/08/12 3. Hypertension 4. Right carotid bruit     Has a history of atrial flutter this past spring. He status post cardioversion. [name] done very well. He continues to take Xarelto. [name] not been exercising nearly as much as he needs to. [name] also been careless with his diet and has picked up a few pounds.  March 06, 2013:  He is doing well. No angina. He is continuing to have foot problems - especially of the left foot. He has intense pain in his heel when he walks.  Sept. 12, 2014:  He has done well. Still not exercising as much. Takes motrin at night. He has left plantar faciatis.   March 19, 2014:  [name] is doing ok No CP or dyspnea. No palpitations. He has gone back into atrial flutter. He cannot tell that his HR is irreg.   He has a hx of a-flutter in the past ( 2014) and was treated with Xarelto and was cardioverted.   May 09, 2015 Jorrell Kuster Terry is a 79 y.o. male who presents for follow up of his atrial flutter , HTN, CAD No CP , not exercising much .   Nov. 14, 2016: Doing well. BP is a bit high. Not much exercise ,  Sore feet.   May 28, 2016:  Doing well.  BP is normal  No CP , no dyspnea at rest.   Slight DOE with activity. Not exercising   Jan. 8, 2018:  [name] is seen today .   Doing well.   Not exercising much .   Knows that he needs to.  Aug. 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  No CP , no dyspnea   April 21, 2018:  [name] is seen back today for follow-up of his coronary artery disease and mild to moderate carotid artery disease.  Recent carotid duplex scan reveals moderate left  internal carotid plaque and mild right internal carotid disease.  Hx of hyperlipidemia ,    Had some back issues  - has 3 bulging discs, 1 was herniated  Back has improved some with conservative management .   Does not think he will need surgery any time soon.   No CP or dyspnea   November 03, 2018: She is seen today for follow-up of his coronary artery disease and moderate carotid artery disease.  Is a moderate left internal carotid plaque.  He has mild right internal carotid disease.  Is planning on selling his [place]  No CP or dyspnea Bp is a bit elevated  Has been eating more salt than he should   Aug. 19, 2020 :  [name] is seen today for follow up visit Has a hx of atrial flutter - is s/p ablation  .   - is in NSR  Today  BP is a bit elevated.  Has gained a few lbs.   February 15, 2020:  [name] is seen today for follow-up visit.  He has a history of coronary artery disease and is status post coronary artery bypass grafting.  He has moderate carotid artery disease.  Said a history of atrial flutter and  is status post ablation. Fell through his ceiling from the attic.   Broke his left ankle ,  Had surgical repair of his ankle  Otherwise doing ok No covid vaccine yet.     Aug. 23, 2021:  [name] is seen today for follow up of his CAD .  BP has been elevated.  He bought a BP cuff. And gets variable readings.  He does not trust the cuff.  I reviewed his recordings   Admits that his diet is not what is should be.  Still eating chips,  Cheese  No angina . No dyspnea   Carotid study is unchanged.   Labs from 3 days ago are stable     Past Medical History:  Diagnosis Date  . Atrial flutter ([place])    s/p cardioversion 02/08/12; ablation by [name] [phone]  . Chronic anticoagulation   . Coronary artery disease    s/p remote MI in 1994 with multiple PCI and subsequent CABG x 5 in 1996  . Dyslipidemia   . Hypertension   . MI (myocardial infarction) ([place]) 1995   . Normal nuclear stress test 2006  . Pseudoaphakia    both eyes  . Retinal detachment     Past Surgical History:  Procedure Laterality Date  . ATRIAL FLUTTER ABLATION N/A 12/08/2014   CTI by [name]  . CARDIAC CATHETERIZATION  1996  . CARDIOVERSION  02/08/2012   Procedure: CARDIOVERSION;  Surgeon: Darden Amber., MD;  Location: [facility] OR;  Service: Cardiovascular;  Laterality: N/A;  . CORONARY ARTERY BYPASS GRAFT  1996   LIMA to LAD, SVG to RV marginal & PD, SVG  to DX and SVG to Ramus  . ORIF ANKLE FRACTURE Left 12/10/2019   Procedure: Open Reduction Internal fixation (ORIF) Left Ankle Fracture;  Surgeon: [name], MD;  Location: [facility];  Service: Orthopedics;  Laterality: Left;  . TONSILLECTOMY AND ADENOIDECTOMY       Current Outpatient Medications  Medication Sig Dispense Refill  . amLODipine (NORVASC) 10 MG tablet Take 1 tablet (10 mg total) by mouth daily. 90 tablet 3  . aspirin 81 MG tablet Take 81 mg by mouth daily.    [name] atorvastatin (LIPITOR) 40 MG tablet Take 1 tablet (40 mg total) by mouth daily. 90 tablet 2  . carvedilol (COREG) 25 MG tablet Take 1 tablet (25 mg total) by mouth 2 (two) times daily. 180 tablet 3  . chlorthalidone (HYGROTON) 25 MG tablet Take 1 tablet (25 mg total) by mouth daily. 90 tablet 3  . Cholecalciferol (VITAMIN D) 2000 units tablet Take 2,000 Units by mouth daily.    . dorzolamide-timolol (COSOPT) 22.3-6.8 MG/ML ophthalmic solution Place 2 drops into both eyes 2 (two) times daily.    . fenofibrate (TRICOR) 145 MG tablet Take 1 tablet (145 mg total) by mouth daily. Please keep upcoming appt in February with Dr. Acie Fredrickson for future refills. Thank you 90 tablet 2  . ibuprofen (ADVIL) 200 MG tablet Take 200 mg by mouth every 6 (six) hours as needed. 200-400mg  as needed    . latanoprost (XALATAN) 0.005 % ophthalmic solution Place 1 drop into both eyes at bedtime.     [name] losartan (COZAAR) 100 MG tablet Take 1 tablet (100 mg total) by  mouth daily. 90 tablet 3  . Multiple Vitamin (MULTIVITAMIN) tablet Take 1 tablet by mouth daily.      . Omega-3 Fatty Acids (OMEGA 3 PO) Take 1 capsule by mouth daily.    [name]  potassium chloride SA (KLOR-CON) 20 MEQ tablet Take 1 tablet by mouth daily 90 tablet 2  . Zinc 50 MG CAPS Take 50 mg by mouth daily.      No current facility-administered medications for this visit.    Allergies:   Brimonidine and Niacin and related    Social History:  The patient  reports that he has never smoked. He has never used smokeless tobacco. He reports that he does not drink alcohol and does not use drugs.   Family History:  The patient's family history includes Coronary artery disease in an other family member; Heart disease in his father and mother.    ROS:  Noted in current hx , otherwise , ROS is negative   Physical Exam: There were no vitals taken for this visit.  GEN:  Elderly male,   NAD  HEENT: Normal NECK: No JVD; No carotid bruits LYMPHATICS: No lymphadenopathy CARDIAC: RRR , no murmurs, rubs, gallops RESPIRATORY:  Clear to auscultation without rales, wheezing or rhonchi  ABDOMEN: Soft, non-tender, non-distended MUSCULOSKELETAL:  No edema; No deformity  SKIN: Warm and dry NEUROLOGIC:  Alert and oriented x 3    EKG:   Aug. 23, 2021:  NSR , 1st degree AV block .    Recent Labs: 08/12/2020: ALT 27; BUN 26; Creatinine, Ser 1.23; Potassium 3.9; Sodium 140    Lipid Panel    Component Value Date/Time   CHOL 112 08/12/2020 0827   TRIG 120 08/12/2020 0827   HDL 29 (L) 08/12/2020 0827   CHOLHDL 3.9 08/12/2020 0827   CHOLHDL 2.7 05/28/2016 0816   VLDL 16 05/28/2016 0816   LDLCALC 61 08/12/2020 0827      Wt Readings from Last 3 Encounters:  02/15/20 164 lb (74.4 kg)  12/10/19 154 lb 5.2 oz (70 kg)  08/12/19 165 lb 12.8 oz (75.2 kg)      Other studies Reviewed: Additional studies/ records that were reviewed today include: . Review of the above records demonstrates:     ASSESSMENT AND PLAN:  1. Coronary artery disease-   No angina .    2. Atrial flutter  -     3. Hypertensive heart disease without CHF -    Continue to work on diet  .  4. Right carotid bruit-    Carotid disease is stable    5. Hyperlipidemia:    - cont meds.    Encouraged him to work on diet, exercise     Current medicines are reviewed at length with the patient today.  The patient does not have concerns regarding medicines.  The following changes have been made:  no change  Labs/ tests ordered today include:   No orders of the defined types were placed in this encounter.    Disposition:   FU with me in 6 months .        Mertie Moores, MD  08/15/2020 6:30 AM    [facility] [address], [place], [place]  [address] Phone: [phone]; Fax: [phone]

## 2020-08-15 NOTE — Patient Instructions (Signed)
Medication Instructions:  Your physician recommends that you continue on your current medications as directed. Please refer to the Current Medication list given to you today.  *If you need a refill on your cardiac medications before your next appointment, please call your pharmacy*   Lab Work: None If you have labs (blood work) drawn today and your tests are completely normal, you will receive your results only by: [name] MyChart Message (if you have MyChart) OR . A paper copy in the mail If you have any lab test that is abnormal or we need to change your treatment, we will call you to review the results.   Testing/Procedures: None   Follow-Up: At [facility], you and your health needs are our priority.  As part of our continuing mission to provide you with exceptional heart care, we have created designated Provider Care Teams.  These Care Teams include your primary Cardiologist (physician) and Advanced Practice Providers (APPs -  Physician Assistants and Nurse Practitioners) who all work together to provide you with the care you need, when you need it.  We recommend signing up for the patient portal called "MyChart".  Sign up information is provided on this After Visit Summary.  MyChart is used to connect with patients for Virtual Visits (Telemedicine).  Patients are able to view lab/test results, encounter notes, upcoming appointments, etc.  Non-urgent messages can be sent to your provider as well.   To learn more about what you can do with MyChart, go to [email or web address].    Your next appointment:   6 month(s)  The format for your next appointment:   In Person  Provider:   You may see Mertie Moores, MD or one of the following Advanced Practice Providers on your designated Care Team:    Truitt Merle, NP  Cecilie Kicks, NP  Kathyrn Drown, NP    Other Instructions   Mediterranean Diet A Mediterranean diet refers to food and lifestyle choices that are based on the  traditions of countries located on the [place]. This way of eating has been shown to help prevent certain conditions and improve outcomes for people who have chronic diseases, like kidney disease and heart disease. What are tips for following this plan? Lifestyle  Cook and eat meals together with your family, when possible.  Drink enough fluid to keep your urine clear or pale yellow.  Be physically active every day. This includes: ? Aerobic exercise like running or swimming. ? Leisure activities like gardening, walking, or housework.  Get 7-8 hours of sleep each night.  If recommended by your health care provider, drink red wine in moderation. This means 1 glass a day for nonpregnant women and 2 glasses a day for men. A glass of wine equals 5 oz (150 mL). Reading food labels   Check the serving size of packaged foods. For foods such as rice and pasta, the serving size refers to the amount of cooked product, not dry.  Check the total fat in packaged foods. Avoid foods that have saturated fat or trans fats.  Check the ingredients list for added sugars, such as corn syrup. Shopping  At the grocery store, buy most of your food from the areas near the walls of the store. This includes: ? Fresh fruits and vegetables (produce). ? Grains, beans, nuts, and seeds. Some of these may be available in unpackaged forms or large amounts (in bulk). ? Fresh seafood. ? Poultry and eggs. ? Low-fat dairy products.  Buy whole ingredients instead of  prepackaged foods.  Buy fresh fruits and vegetables in-season from local farmers markets.  Buy frozen fruits and vegetables in resealable bags.  If you do not have access to quality fresh seafood, buy precooked frozen shrimp or canned fish, such as tuna, salmon, or sardines.  Buy small amounts of raw or cooked vegetables, salads, or olives from the deli or salad bar at your store.  Stock your pantry so you always have certain foods on hand,  such as olive oil, canned tuna, canned tomatoes, rice, pasta, and beans. Cooking  Cook foods with extra-virgin olive oil instead of using butter or other vegetable oils.  Have meat as a side dish, and have vegetables or grains as your main dish. This means having meat in small portions or adding small amounts of meat to foods like pasta or stew.  Use beans or vegetables instead of meat in common dishes like chili or lasagna.  Experiment with different cooking methods. Try roasting or broiling vegetables instead of steaming or sauteing them.  Add frozen vegetables to soups, stews, pasta, or rice.  Add nuts or seeds for added healthy fat at each meal. You can add these to yogurt, salads, or vegetable dishes.  Marinate fish or vegetables using olive oil, lemon juice, garlic, and fresh herbs. Meal planning   Plan to eat 1 vegetarian meal one day each week. Try to work up to 2 vegetarian meals, if possible.  Eat seafood 2 or more times a week.  Have healthy snacks readily available, such as: ? Vegetable sticks with hummus. ? [place] yogurt. ? Fruit and nut trail mix.  Eat balanced meals throughout the week. This includes: ? Fruit: 2-3 servings a day ? Vegetables: 4-5 servings a day ? Low-fat dairy: 2 servings a day ? Fish, poultry, or lean meat: 1 serving a day ? Beans and legumes: 2 or more servings a week ? Nuts and seeds: 1-2 servings a day ? Whole grains: 6-8 servings a day ? Extra-virgin olive oil: 3-4 servings a day  Limit red meat and sweets to only a few servings a month What are my food choices?  Mediterranean diet ? Recommended  Grains: Whole-grain pasta. Brown rice. Bulgar wheat. Polenta. Couscous. Whole-wheat bread. [place].  Vegetables: Artichokes. Beets. Broccoli. Cabbage. Carrots. Eggplant. Green beans. Chard. Kale. Spinach. Onions. Leeks. Peas. Squash. Tomatoes. Peppers. Radishes.  Fruits: Apples. Apricots. Avocado. Berries. Bananas. Cherries. Dates.  Figs. Grapes. Lemons. Melon. Oranges. Peaches. Plums. Pomegranate.  Meats and other protein foods: Beans. Almonds. Sunflower seeds. Pine nuts. Peanuts. [place]. Salmon. Scallops. Shrimp. [place]. Tilapia. Clams. Oysters. Eggs.  Dairy: Low-fat milk. Cheese. Greek yogurt.  Beverages: Water. Red wine. Herbal tea.  Fats and oils: Extra virgin olive oil. Avocado oil. Grape seed oil.  Sweets and desserts: [place] yogurt with honey. Baked apples. Poached pears. Trail mix.  Seasoning and other foods: Basil. Cilantro. Coriander. Cumin. Mint. Parsley. Sage. Rosemary. Tarragon. Garlic. Oregano. Thyme. Pepper. Balsalmic vinegar. Tahini. Hummus. Tomato sauce. Olives. Mushrooms. ? Limit these  Grains: Prepackaged pasta or rice dishes. Prepackaged cereal with added sugar.  Vegetables: Deep fried potatoes (Toney fries).  Fruits: Fruit canned in syrup.  Meats and other protein foods: Beef. Pork. Lamb. Poultry with skin. Hot dogs. Berniece Salines.  Dairy: Ice cream. Sour cream. Whole milk.  Beverages: Juice. Sugar-sweetened soft drinks. Beer. Liquor and spirits.  Fats and oils: Butter. Canola oil. Vegetable oil. Beef fat (tallow). Lard.  Sweets and desserts: Cookies. Cakes. Pies. Candy.  Seasoning and other foods: Mayonnaise. Premade  sauces and marinades. The items listed may not be a complete list. Talk with your dietitian about what dietary choices are right for you. Summary  The Mediterranean diet includes both food and lifestyle choices.  Eat a variety of fresh fruits and vegetables, beans, nuts, seeds, and whole grains.  Limit the amount of red meat and sweets that you eat.  Talk with your health care provider about whether it is safe for you to drink red wine in moderation. This means 1 glass a day for nonpregnant women and 2 glasses a day for men. A glass of wine equals 5 oz (150 mL). This information is not intended to replace advice given to you by your health care provider. Make sure you discuss any  questions you have with your health care provider. Document Revised: 08/09/2016 Document Reviewed: 08/02/2016 Elsevier Patient Education  [place] DASH stands for "Dietary Approaches to Stop Hypertension." The DASH eating plan is a healthy eating plan that has been shown to reduce high blood pressure (hypertension). It may also reduce your risk for type 2 diabetes, heart disease, and stroke. The DASH eating plan may also help with weight loss. What are tips for following this plan?  General guidelines  Avoid eating more than 2,300 mg (milligrams) of salt (sodium) a day. If you have hypertension, you may need to reduce your sodium intake to 1,500 mg a day.  Limit alcohol intake to no more than 1 drink a day for nonpregnant women and 2 drinks a day for men. One drink equals 12 oz of beer, 5 oz of wine, or 1 oz of hard liquor.  Work with your health care provider to maintain a healthy body weight or to lose weight. Ask what an ideal weight is for you.  Get at least 30 minutes of exercise that causes your heart to beat faster (aerobic exercise) most days of the week. Activities may include walking, swimming, or biking.  Work with your health care provider or diet and nutrition specialist (dietitian) to adjust your eating plan to your individual calorie needs. Reading food labels   Check food labels for the amount of sodium per serving. Choose foods with less than 5 percent of the Daily Value of sodium. Generally, foods with less than 300 mg of sodium per serving fit into this eating plan.  To find whole grains, look for the word "whole" as the first word in the ingredient list. Shopping  Buy products labeled as "low-sodium" or "no salt added."  Buy fresh foods. Avoid canned foods and premade or frozen meals. Cooking  Avoid adding salt when cooking. Use salt-free seasonings or herbs instead of table salt or sea salt. Check with your health care provider or  pharmacist before using salt substitutes.  Do not fry foods. Cook foods using healthy methods such as baking, boiling, grilling, and broiling instead.  Cook with heart-healthy oils, such as olive, canola, soybean, or sunflower oil. Meal planning  Eat a balanced diet that includes: ? 5 or more servings of fruits and vegetables each day. At each meal, try to fill half of your plate with fruits and vegetables. ? Up to 6-8 servings of whole grains each day. ? Less than 6 oz of lean meat, poultry, or fish each day. A 3-oz serving of meat is about the same size as a deck of cards. One egg equals 1 oz. ? 2 servings of low-fat dairy each day. ? A serving of nuts, seeds,  or beans 5 times each week. ? Heart-healthy fats. Healthy fats called Omega-3 fatty acids are found in foods such as flaxseeds and coldwater fish, like sardines, salmon, and mackerel.  Limit how much you eat of the following: ? Canned or prepackaged foods. ? Food that is high in trans fat, such as fried foods. ? Food that is high in saturated fat, such as fatty meat. ? Sweets, desserts, sugary drinks, and other foods with added sugar. ? Full-fat dairy products.  Do not salt foods before eating.  Try to eat at least 2 vegetarian meals each week.  Eat more home-cooked food and less restaurant, buffet, and fast food.  When eating at a restaurant, ask that your food be prepared with less salt or no salt, if possible. What foods are recommended? The items listed may not be a complete list. Talk with your dietitian about what dietary choices are best for you. Grains Whole-grain or whole-wheat bread. Whole-grain or whole-wheat pasta. Brown rice. [place]. Bulgur. Whole-grain and low-sodium cereals. Pita bread. Low-fat, low-sodium crackers. Whole-wheat flour tortillas. Vegetables Fresh or frozen vegetables (raw, steamed, roasted, or grilled). Low-sodium or reduced-sodium tomato and vegetable juice. Low-sodium or  reduced-sodium tomato sauce and tomato paste. Low-sodium or reduced-sodium canned vegetables. Fruits All fresh, dried, or frozen fruit. Canned fruit in natural juice (without added sugar). Meat and other protein foods Skinless chicken or [place]. Ground chicken or [place]. Pork with fat trimmed off. Fish and seafood. Egg whites. Dried beans, peas, or lentils. Unsalted nuts, nut butters, and seeds. Unsalted canned beans. Lean cuts of beef with fat trimmed off. Low-sodium, lean deli meat. Dairy Low-fat (1%) or fat-free (skim) milk. Fat-free, low-fat, or reduced-fat cheeses. Nonfat, low-sodium ricotta or cottage cheese. Low-fat or nonfat yogurt. Low-fat, low-sodium cheese. Fats and oils Soft margarine without trans fats. Vegetable oil. Low-fat, reduced-fat, or light mayonnaise and salad dressings (reduced-sodium). Canola, safflower, olive, soybean, and sunflower oils. Avocado. Seasoning and other foods Herbs. Spices. Seasoning mixes without salt. Unsalted popcorn and pretzels. Fat-free sweets. What foods are not recommended? The items listed may not be a complete list. Talk with your dietitian about what dietary choices are best for you. Grains Baked goods made with fat, such as croissants, muffins, or some breads. Dry pasta or rice meal packs. Vegetables Creamed or fried vegetables. Vegetables in a cheese sauce. Regular canned vegetables (not low-sodium or reduced-sodium). Regular canned tomato sauce and paste (not low-sodium or reduced-sodium). Regular tomato and vegetable juice (not low-sodium or reduced-sodium). Angie Fava. Olives. Fruits Canned fruit in a light or heavy syrup. Fried fruit. Fruit in cream or butter sauce. Meat and other protein foods Fatty cuts of meat. Ribs. Fried meat. Berniece Salines. Sausage. Bologna and other processed lunch meats. Salami. Fatback. Hotdogs. Bratwurst. Salted nuts and seeds. Canned beans with added salt. Canned or smoked fish. Whole eggs or egg yolks. Chicken or [place]  with skin. Dairy Whole or 2% milk, cream, and half-and-half. Whole or full-fat cream cheese. Whole-fat or sweetened yogurt. Full-fat cheese. Nondairy creamers. Whipped toppings. Processed cheese and cheese spreads. Fats and oils Butter. Stick margarine. Lard. Shortening. Ghee. Bacon fat. Tropical oils, such as coconut, palm kernel, or palm oil. Seasoning and other foods Salted popcorn and pretzels. Onion salt, garlic salt, seasoned salt, table salt, and sea salt. Worcestershire sauce. Tartar sauce. Barbecue sauce. Teriyaki sauce. Soy sauce, including reduced-sodium. Steak sauce. Canned and packaged gravies. Fish sauce. Oyster sauce. Cocktail sauce. Horseradish that you find on the shelf. Ketchup. Mustard. Meat flavorings and tenderizers.  Bouillon cubes. Hot sauce and Tabasco sauce. Premade or packaged marinades. Premade or packaged taco seasonings. Relishes. Regular salad dressings. Where to find more information:  National Heart, Lung, and [place]: https://wilson-eaton.com/  American Heart Association: www.heart.org Summary  The DASH eating plan is a healthy eating plan that has been shown to reduce high blood pressure (hypertension). It may also reduce your risk for type 2 diabetes, heart disease, and stroke.  With the DASH eating plan, you should limit salt (sodium) intake to 2,300 mg a day. If you have hypertension, you may need to reduce your sodium intake to 1,500 mg a day.  When on the DASH eating plan, aim to eat more fresh fruits and vegetables, whole grains, lean proteins, low-fat dairy, and heart-healthy fats.  Work with your health care provider or diet and nutrition specialist (dietitian) to adjust your eating plan to your individual calorie needs. This information is not intended to replace advice given to you by your health care provider. Make sure you discuss any questions you have with your health care provider. Document Revised: 11/22/2017 Document Reviewed:  12/03/2016 Elsevier Patient Education  2020 [employer].

## 2020-08-16 ENCOUNTER — Telehealth: Payer: Self-pay | Admitting: Cardiovascular Disease

## 2020-08-16 NOTE — Telephone Encounter (Signed)
    Pt is returning call to get lab results 

## 2020-08-16 NOTE — Telephone Encounter (Signed)
Returned call to patient to report lab results and he states someone from our office just called him to report stable results, no changes per Dr. Acie Fredrickson. He requests that copies be placed in mail to him which I advised I will do. He thanked me for the call.

## 2020-08-25 ENCOUNTER — Telehealth: Payer: Self-pay | Admitting: Cardiovascular Disease

## 2020-08-25 NOTE — Telephone Encounter (Signed)
New Message:     Pt says he have some literature that he wants Dr Acie Fredrickson to read.He wants to know a good e-mail or fax number he can use, he wantsto make sure Dr Acie Fredrickson get this.

## 2020-08-25 NOTE — Telephone Encounter (Signed)
Pt going to either mail or drop off copy of material for Dr Acie Fredrickson to review .Adonis Housekeeper

## 2020-09-05 DIAGNOSIS — H401111 Primary open-angle glaucoma, right eye, mild stage: Secondary | ICD-10-CM | POA: Diagnosis not present

## 2020-09-05 DIAGNOSIS — H401122 Primary open-angle glaucoma, left eye, moderate stage: Secondary | ICD-10-CM | POA: Diagnosis not present

## 2020-10-20 ENCOUNTER — Other Ambulatory Visit: Payer: Self-pay

## 2020-10-20 MED ORDER — FENOFIBRATE 145 MG PO TABS: 145.0000 mg | ORAL_TABLET | Freq: Every day | ORAL | 3 refills | Status: DC

## 2020-11-25 ENCOUNTER — Other Ambulatory Visit: Payer: Self-pay | Admitting: Cardiovascular Disease

## 2021-01-17 ENCOUNTER — Other Ambulatory Visit: Payer: Self-pay

## 2021-01-17 MED ORDER — CHLORTHALIDONE 25 MG PO TABS: 25.0000 mg | ORAL_TABLET | Freq: Every day | ORAL | 2 refills | Status: DC

## 2021-01-20 ENCOUNTER — Other Ambulatory Visit: Payer: Self-pay | Admitting: Cardiovascular Disease

## 2021-02-06 DIAGNOSIS — H401111 Primary open-angle glaucoma, right eye, mild stage: Secondary | ICD-10-CM | POA: Diagnosis not present

## 2021-02-06 DIAGNOSIS — H401122 Primary open-angle glaucoma, left eye, moderate stage: Secondary | ICD-10-CM | POA: Diagnosis not present

## 2021-02-07 ENCOUNTER — Encounter: Payer: Self-pay | Admitting: Cardiovascular Disease

## 2021-02-07 NOTE — Progress Notes (Signed)
Cardiology Office Note   Date:  02/08/2021   ID:  Xavier Bell [birth year], MRN [id number]  PCP:  Horald Pollen, MD  Cardiologist:   Mertie Moores, MD   Chief Complaint  Patient presents with  . Coronary Artery Disease  . Hyperlipidemia   1. Coronary artery disease-status post CABG 2. Atrial flutter- s/p cardioversion 02/08/12 3. Hypertension 4. Right carotid bruit     Has a history of atrial flutter this past spring. He status post cardioversion. [name] done very well. He continues to take Xarelto. [name] not been exercising nearly as much as he needs to. [name] also been careless with his diet and has picked up a few pounds.  March 06, 2013:  He is doing well. No angina. He is continuing to have foot problems - especially of the left foot. He has intense pain in his heel when he walks.  Sept. 12, 2014:  He has done well. Still not exercising as much. Takes motrin at night. He has left plantar faciatis.   March 19, 2014:  Xavier Bell is doing ok No CP or dyspnea. No palpitations. He has gone back into atrial flutter. He cannot tell that his HR is irreg.   He has a hx of a-flutter in the past ( 2014) and was treated with Xarelto and was cardioverted.   May 09, 2015 Xavier Bell is a 80 y.o. male who presents for follow up of his atrial flutter , HTN, CAD No CP , not exercising much .   Nov. 14, 2016: Doing well. BP is a bit high. Not much exercise ,  Sore feet.   May 28, 2016:  Doing well.  BP is normal  No CP , no dyspnea at rest.   Slight DOE with activity. Not exercising   Jan. 8, 2018:  Xavier Bell is seen today .   Doing well.   Not exercising much .   Knows that he needs to.  Aug. 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  No CP , no dyspnea   April 21, 2018:  Xavier Bell is seen back today for follow-up of his coronary artery disease and mild to moderate carotid artery disease.  Recent carotid duplex scan reveals moderate left  internal carotid plaque and mild right internal carotid disease.  Hx of hyperlipidemia ,    Had some back issues  - has 3 bulging discs, 1 was herniated  Back has improved some with conservative management .   Does not think he will need surgery any time soon.   No CP or dyspnea   November 03, 2018: She is seen today for follow-up of his coronary artery disease and moderate carotid artery disease.  Is a moderate left internal carotid plaque.  He has mild right internal carotid disease.  Is planning on selling his [place]  No CP or dyspnea Bp is a bit elevated  Has been eating more salt than he should   Aug. 19, 2020 :  Xavier Bell is seen today for follow up visit Has a hx of atrial flutter - is s/p ablation  .   - is in NSR  Today  BP is a bit elevated.  Has gained a few lbs.   February 15, 2020:  Xavier Bell is seen today for follow-up visit.  He has a history of coronary artery disease and is status post coronary artery bypass grafting.  He has moderate carotid artery disease.  Said a history of atrial flutter and  is status post ablation. Fell through his ceiling from the attic.   Broke his left ankle ,  Had surgical repair of his ankle  Otherwise doing ok No covid vaccine yet.     Aug. 23, 2021:  Xavier Bell is seen today for follow up of his CAD .  BP has been elevated.  He bought a BP cuff. And gets variable readings.  He does not trust the cuff.  I reviewed his recordings   Admits that his diet is not what is should be.  Still eating chips,  Cheese  No angina . No dyspnea   Carotid study is unchanged.   Labs from 3 days ago are stable   Feb. 16, 2022: Xavier Bell is seen today for follow up of his CAD  HTN HLD Is exercising some .   Trying to lose some weight . 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   BP log shows BP typically in the 140-150 range.   Past Medical History:  Diagnosis Date  . Atrial  flutter ([place])    s/p cardioversion 02/08/12; ablation by [name] [phone]  . Chronic anticoagulation   . Coronary artery disease    s/p remote MI in 1994 with multiple PCI and subsequent CABG x 5 in 1996  . Dyslipidemia   . Hypertension   . MI (myocardial infarction) ([place]) 1995  . Normal nuclear stress test 2006  . Pseudoaphakia    both eyes  . Retinal detachment     Past Surgical History:  Procedure Laterality Date  . ATRIAL FLUTTER ABLATION N/A 12/08/2014   CTI by [name]  . CARDIAC CATHETERIZATION  1996  . CARDIOVERSION  02/08/2012   Procedure: CARDIOVERSION;  Surgeon: Darden Amber., MD;  Location: [facility] OR;  Service: Cardiovascular;  Laterality: N/A;  . CORONARY ARTERY BYPASS GRAFT  1996   LIMA to LAD, SVG to RV marginal & PD, SVG  to DX and SVG to Ramus  . ORIF ANKLE FRACTURE Left 12/10/2019   Procedure: Open Reduction Internal fixation (ORIF) Left Ankle Fracture;  Surgeon: [name], MD;  Location: [facility];  Service: Orthopedics;  Laterality: Left;  . TONSILLECTOMY AND ADENOIDECTOMY       Current Outpatient Medications  Medication Sig Dispense Refill  . amLODipine (NORVASC) 10 MG tablet Take 1 tablet (10 mg total) by mouth daily. 90 tablet 3  . aspirin 81 MG tablet Take 81 mg by mouth daily.    [name] atorvastatin (LIPITOR) 40 MG tablet Take 1 tablet by mouth daily 90 tablet 1  . BRIMONIDINE TARTRATE OP Apply 1 drop to eye daily in the afternoon.    . carvedilol (COREG) 25 MG tablet Take 1 tablet (25 mg total) by mouth 2 (two) times daily. 180 tablet 3  . chlorthalidone (HYGROTON) 25 MG tablet Take 1 tablet (25 mg total) by mouth daily. 90 tablet 2  . Cholecalciferol (VITAMIN D) 2000 units tablet Take 2,000 Units by mouth daily.    . dorzolamide-timolol (COSOPT) 22.3-6.8 MG/ML ophthalmic solution Place 2 drops into both eyes 2 (two) times daily.    . fenofibrate (TRICOR) 145 MG tablet Take 1 tablet (145 mg total) by mouth daily. 90 tablet 3  . ibuprofen  (ADVIL) 200 MG tablet Take 200 mg by mouth every 6 (six) hours as needed. 200-400mg  as needed    . latanoprost (XALATAN) 0.005 % ophthalmic solution Place 1 drop into both  eyes at bedtime.     [name] loratadine (ALLERGY RELIEF) 10 MG tablet Take 10 mg by mouth daily.    . Multiple Vitamin (MULTIVITAMIN) tablet Take 1 tablet by mouth daily.    . Omega-3 Fatty Acids (OMEGA 3 PO) Take 1 capsule by mouth daily.    . potassium chloride SA (KLOR-CON) 20 MEQ tablet Take 1 tablet by mouth daily 90 tablet 1  . Probiotic Product (PROBIOTIC BLEND PO) Take 1 tablet by mouth daily at 12 noon.    . valsartan (DIOVAN) 320 MG tablet Take 1 tablet (320 mg total) by mouth daily. 90 tablet 3  . Zinc 50 MG CAPS Take 50 mg by mouth daily.     No current facility-administered medications for this visit.    Allergies:   Brimonidine and Niacin and related    Social History:  The patient  reports that he has never smoked. He has never used smokeless tobacco. He reports that he does not drink alcohol and does not use drugs.   Family History:  The patient's family history includes Coronary artery disease in an other family member; Heart disease in his father and mother.    ROS:  Noted in current hx , otherwise , ROS is negative   Physical Exam: Blood pressure (!) 144/78, pulse 62, height 5\' 7"  (1.702 m), weight 168 lb (76.2 kg), SpO2 96 %.  GEN:  Well nourished, well developed in no acute distress HEENT: Normal NECK: No JVD; No carotid bruits LYMPHATICS: No lymphadenopathy CARDIAC: RRR , no murmurs, rubs, gallops RESPIRATORY:  Clear to auscultation without rales, wheezing or rhonchi  ABDOMEN: Soft, non-tender, non-distended MUSCULOSKELETAL:  No edema; No deformity  SKIN: Warm and dry NEUROLOGIC:  Alert and oriented x 3   EKG:    .    Recent Labs: 08/12/2020: ALT 27; BUN 26; Creatinine, Ser 1.23; Potassium 3.9; Sodium 140    Lipid Panel    Component Value Date/Time   CHOL 112 08/12/2020 0827   TRIG  120 08/12/2020 0827   HDL 29 (L) 08/12/2020 0827   CHOLHDL 3.9 08/12/2020 0827   CHOLHDL 2.7 05/28/2016 0816   VLDL 16 05/28/2016 0816   LDLCALC 61 08/12/2020 0827      Wt Readings from Last 3 Encounters:  02/08/21 168 lb (76.2 kg)  08/15/20 166 lb 3.2 oz (75.4 kg)  02/15/20 164 lb (74.4 kg)      Other studies Reviewed: Additional studies/ records that were reviewed today include: . Review of the above records demonstrates:    ASSESSMENT AND PLAN:  1. Coronary artery disease-     No angina .  Cont coreg. ASA . Statin   2. Atrial flutter  -   No further episides of atrial flutter   3. Hypertensive heart disease without CHF -      .BP is elevated.   He still eats lots of salt.   Will change his Losartan to valsartan 360 mg a day BMP in 3 weeks.   4. Right carotid bruit-        5. Hyperlipidemia:    -  Check lipids and ALT in 3 weeks.    Current medicines are reviewed at length with the patient today.  The patient does not have concerns regarding medicines.     Labs/ tests ordered today include:   Orders Placed This Encounter  Procedures  . ALT  . Basic metabolic panel  . Lipid panel  . AMB Referral to [facility] Pharm-D  Disposition:          Mertie Moores, MD  02/08/2021 1:50 PM    [facility] [address], [place], [place]  [address] Phone: [phone]; Fax: [phone]

## 2021-02-08 ENCOUNTER — Encounter: Payer: Self-pay | Admitting: Cardiovascular Disease

## 2021-02-08 ENCOUNTER — Other Ambulatory Visit: Payer: Self-pay

## 2021-02-08 ENCOUNTER — Ambulatory Visit: Payer: PPO | Admitting: Cardiovascular Disease

## 2021-02-08 VITALS — BP 144/78 | HR 62 | Ht 67.0 in | Wt 168.0 lb

## 2021-02-08 DIAGNOSIS — I10 Essential (primary) hypertension: Secondary | ICD-10-CM | POA: Diagnosis not present

## 2021-02-08 DIAGNOSIS — E782 Mixed hyperlipidemia: Secondary | ICD-10-CM

## 2021-02-08 MED ORDER — VALSARTAN 320 MG PO TABS: 320.0000 mg | ORAL_TABLET | Freq: Every day | ORAL | 3 refills | Status: DC

## 2021-02-08 NOTE — Patient Instructions (Signed)
Medication Instructions:   Your physician has recommended you make the following change in your medication:   Stop taking Losartan  Start taking Valsartan 320mg  daily   *If you need a refill on your cardiac medications before your next appointment, please call your pharmacy*   Lab Work: Lipids, BMET, ALT Your physician recommends that you return for lab work in:3 weeks. You will need to FAST for this appointment - nothing to eat or drink after midnight the night before except water.   If you have labs (blood work) drawn today and your tests are completely normal, you will receive your results only by: [name] MyChart Message (if you have MyChart) OR . A paper copy in the mail If you have any lab test that is abnormal or we need to change your treatment, we will call you to review the results.   Testing/Procedures: none   Follow-Up: At [facility], you and your health needs are our priority.  As part of our continuing mission to provide you with exceptional heart care, we have created designated Provider Care Teams.  These Care Teams include your primary Cardiologist (physician) and Advanced Practice Providers (APPs -  Physician Assistants and Nurse Practitioners) who all work together to provide you with the care you need, when you need it.  We recommend signing up for the patient portal called "MyChart".  Sign up information is provided on this After Visit Summary.  MyChart is used to connect with patients for Virtual Visits (Telemedicine).  Patients are able to view lab/test results, encounter notes, upcoming appointments, etc.  Non-urgent messages can be sent to your provider as well.   To learn more about what you can do with MyChart, go to [email or web address].    Your next appointment:   3 month(s)  The format for your next appointment:   In Person  Provider:   You will see one of the following Advanced Practice Providers on your designated Care Team:    Richardson Dopp,  PA-C  [place], [place]   Other Instructions Referral to HTN clinic at [address].

## 2021-02-16 ENCOUNTER — Telehealth: Payer: Self-pay | Admitting: Cardiovascular Disease

## 2021-02-16 NOTE — Telephone Encounter (Signed)
Pt c/o BP issue: STAT if pt c/o blurred vision, one-sided weakness or slurred speech  1. What are your last 5 BP readings? *before eating and drinking  138/65 pulse reading  The first 2 times kept saying low- the 3 rd time 142/87 and pulse 64  2. Are you having any other symptoms (ex. Dizziness, headache, blurred vision, passed out)?  no  3. What is your BP issue? Blood pressure is still running high for him, but more concerned about his pulse. His pulse rate kept saying low and would not record a reading

## 2021-02-16 NOTE — Telephone Encounter (Signed)
Returned call to Pt.  He is concerned that a few times when he has checked his blood pressure his heart rate read as "low" instead of giving him a number.  Pt denies having any low heart rate symptoms-no dizziness, fatigue or presyncope.  Advised Pt that in absence of symptoms a "low" heart rate reading on his blood pressure cuff was not cause for alarm.  He will continue to monitor and if he consistently gets a "low" heart rate reading with symptoms he will call office.  Pt thanked nurse for call.

## 2021-02-17 ENCOUNTER — Other Ambulatory Visit: Payer: Self-pay

## 2021-02-17 MED ORDER — AMLODIPINE BESYLATE 10 MG PO TABS: 10.0000 mg | ORAL_TABLET | Freq: Every day | ORAL | 3 refills | Status: DC

## 2021-02-24 ENCOUNTER — Other Ambulatory Visit: Payer: Self-pay

## 2021-02-24 MED ORDER — CARVEDILOL 25 MG PO TABS: 25.0000 mg | ORAL_TABLET | Freq: Two times a day (BID) | ORAL | 3 refills | Status: DC

## 2021-03-02 ENCOUNTER — Ambulatory Visit (INDEPENDENT_AMBULATORY_CARE_PROVIDER_SITE_OTHER): Payer: PPO | Admitting: Pharmacist

## 2021-03-02 ENCOUNTER — Other Ambulatory Visit: Payer: Self-pay

## 2021-03-02 ENCOUNTER — Encounter: Payer: Self-pay | Admitting: Pharmacist

## 2021-03-02 ENCOUNTER — Other Ambulatory Visit: Payer: PPO | Admitting: *Deleted

## 2021-03-02 VITALS — BP 132/64 | HR 54

## 2021-03-02 DIAGNOSIS — E782 Mixed hyperlipidemia: Secondary | ICD-10-CM | POA: Diagnosis not present

## 2021-03-02 DIAGNOSIS — I10 Essential (primary) hypertension: Secondary | ICD-10-CM

## 2021-03-02 LAB — BASIC METABOLIC PANEL
BUN/Creatinine Ratio: 25 — ABNORMAL HIGH (ref 10–24)
BUN: 31 mg/dL — ABNORMAL HIGH (ref 8–27)
CO2: 19 mmol/L — ABNORMAL LOW (ref 20–29)
Calcium: 9.8 mg/dL (ref 8.6–10.2)
Chloride: 107 mmol/L — ABNORMAL HIGH (ref 96–106)
Creatinine, Ser: 1.25 mg/dL (ref 0.76–1.27)
Glucose: 113 mg/dL — ABNORMAL HIGH (ref 65–99)
Potassium: 4.4 mmol/L (ref 3.5–5.2)
Sodium: 140 mmol/L (ref 134–144)
eGFR: 57 mL/min/{1.73_m2} — ABNORMAL LOW (ref >59)

## 2021-03-02 LAB — LIPID PANEL
Chol/HDL Ratio: 3.2 ratio (ref 0.0–5.0)
Cholesterol, Total: 100 mg/dL (ref 100–199)
HDL: 31 mg/dL — ABNORMAL LOW (ref >39)
LDL Chol Calc (NIH): 51 mg/dL (ref 0–99)
Triglycerides: 89 mg/dL (ref 0–149)
VLDL Cholesterol Cal: 18 mg/dL (ref 5–40)

## 2021-03-02 LAB — ALT: ALT: 24 IU/L (ref 0–44)

## 2021-03-02 NOTE — Progress Notes (Signed)
Patient ID: Xavier Bell                 DOB: 06-02-1941                      MRN: [id number]     HPI: Xavier Bell is a 80 y.o. male referred by Dr. Acie Fredrickson to HTN clinic. PMH is significant for MI, HLD, HTN and CAD.  Has struggled for years to control blood pressure.  At last visit with Dr Acie Fredrickson on 2/16, valsartan was increased to 320mg  daily.  Patient presents today in good spirits.  Reports no adverse effects to bvalsartan increase.  No dizziness or headaches.  Brought BP log and BP has begun to decrease since valsartan increase.  Pulse has remained in 50s-60s bpm but patient reports no issues.  3/9: 137/45  54 3/8: 134/59  52 3/7: 133/58  55 3/6: 126/57  54 3/5: 134/69  58 3/4: 124/85  61 3/3: 135/60  53 3/2: 139/73  55 3/1: 135/58  55  Reports he spends a lot of his time sitting.  Is going to try to be more physically active when the temperature warms up.  Has a treadmill also.   His biggest issue is salt intake which he admits to struggling with.  Has bought salt substitutes but still eats snack foods and processed foods.  Is trying to limit himself to less than 1500mg  a day but struggles.  Drinks a small glass of white wine nightly.  No tobacco.    [place] through his attic ceiling in December 2020 and had ankle surgery.  Reports no lingering pain.  Due for lab work today  Current HTN meds: amlodipine 10mg  daily, carvedilol 25mg  BID, valsartan 320mg  daily, chlorthalidone 25mg  daily  BP goal: <130/80   Wt Readings from Last 3 Encounters:  02/08/21 168 lb (76.2 kg)  08/15/20 166 lb 3.2 oz (75.4 kg)  02/15/20 164 lb (74.4 kg)   BP Readings from Last 3 Encounters:  02/08/21 (!) 144/78  08/15/20 (!) 146/92  02/15/20 (!) 160/56   Pulse Readings from Last 3 Encounters:  02/08/21 62  08/15/20 60  02/15/20 60    Renal function: CrCl cannot be calculated (Patient's most recent lab result is older than the maximum 21 days allowed.).  Past Medical History:   Diagnosis Date  . Atrial flutter ([place])    s/p cardioversion 02/08/12; ablation by [name] [phone]  . Chronic anticoagulation   . Coronary artery disease    s/p remote MI in 1994 with multiple PCI and subsequent CABG x 5 in 1996  . Dyslipidemia   . Hypertension   . MI (myocardial infarction) ([place]) 1995  . Normal nuclear stress test 2006  . Pseudoaphakia    both eyes  . Retinal detachment     Current Outpatient Medications on File Prior to Visit  Medication Sig Dispense Refill  . amLODipine (NORVASC) 10 MG tablet Take 1 tablet (10 mg total) by mouth daily. 90 tablet 3  . aspirin 81 MG tablet Take 81 mg by mouth daily.    [name] atorvastatin (LIPITOR) 40 MG tablet Take 1 tablet by mouth daily 90 tablet 1  . BRIMONIDINE TARTRATE OP Apply 1 drop to eye daily in the afternoon.    . carvedilol (COREG) 25 MG tablet Take 1 tablet (25 mg total) by mouth 2 (two) times daily. 180 tablet 3  . chlorthalidone (HYGROTON) 25 MG tablet Take 1 tablet (25 mg total)  by mouth daily. 90 tablet 2  . Cholecalciferol (VITAMIN D) 2000 units tablet Take 2,000 Units by mouth daily.    . dorzolamide-timolol (COSOPT) 22.3-6.8 MG/ML ophthalmic solution Place 2 drops into both eyes 2 (two) times daily.    . fenofibrate (TRICOR) 145 MG tablet Take 1 tablet (145 mg total) by mouth daily. 90 tablet 3  . ibuprofen (ADVIL) 200 MG tablet Take 200 mg by mouth every 6 (six) hours as needed. 200-400mg  as needed    . latanoprost (XALATAN) 0.005 % ophthalmic solution Place 1 drop into both eyes at bedtime.     [name] loratadine (ALLERGY RELIEF) 10 MG tablet Take 10 mg by mouth daily.    . Multiple Vitamin (MULTIVITAMIN) tablet Take 1 tablet by mouth daily.    . Omega-3 Fatty Acids (OMEGA 3 PO) Take 1 capsule by mouth daily.    . potassium chloride SA (KLOR-CON) 20 MEQ tablet Take 1 tablet by mouth daily 90 tablet 1  . Probiotic Product (PROBIOTIC BLEND PO) Take 1 tablet by mouth daily at 12 noon.    . valsartan (DIOVAN) 320 MG tablet  Take 1 tablet (320 mg total) by mouth daily. 90 tablet 3  . Zinc 50 MG CAPS Take 50 mg by mouth daily.     No current facility-administered medications on file prior to visit.    Allergies  Allergen Reactions  . Brimonidine     unknown  . Niacin And Related Other (See Comments)    Stomach Pain and flushing      Assessment/Plan:  1. Hypertension - Patient BP in room 132/64 which is slightly above goal of <130/80 but much improved since valsartan increase.  BP trending down .  Patient currently managed on four max dose hypertension medications without adverse effects so hesitant to start anything new.  Likely will reach goal if is able to be more physically active and reduce sodium intake.  Counseled patient on being more physically active at least 30 minutes a day at least 5 days a week.  Counseled patient to concentrate on fresh, non processed foods that are low in salt.   Continue chlorthalidone 25 mg daily Continue carvedilol 25mg  BID Continue valsartan 320mg  daily Continue amlodipine 10 mg daily Recheck as needed.  Patient will call if BP begins to trend up in opposite direction.  Karren Cobble, PharmD, BCACP, [place], [place] [address], [place], [place] [address] Phone: [phone]; Fax: [phone] 03/02/2021 11:21 AM

## 2021-03-02 NOTE — Patient Instructions (Addendum)
It was nice meeting you today!  We would like your blood pressure to be less than 130/80  Continue to watch your salt intake and try to increase your physical activity to 30 minutes a day at least 5 days a week  Continue amlodipine 10mg  daily Continue carvedilol 25mg  twice a day Continue valsartan 320mg  once a day Continue chlorthalidone 25 mg once a day  Please call with any questions or to schedule a follow up if blood pressures start to trend up   Karren Cobble, PharmD, BCACP, [place], [place]. [address], [place], [place] [address] Phone: [phone]; Fax: [phone] 03/02/2021 8:53 AM

## 2021-03-06 DIAGNOSIS — H401122 Primary open-angle glaucoma, left eye, moderate stage: Secondary | ICD-10-CM | POA: Diagnosis not present

## 2021-03-06 DIAGNOSIS — H401111 Primary open-angle glaucoma, right eye, mild stage: Secondary | ICD-10-CM | POA: Diagnosis not present

## 2021-03-08 DIAGNOSIS — Z961 Presence of intraocular lens: Secondary | ICD-10-CM | POA: Diagnosis not present

## 2021-03-08 DIAGNOSIS — H401122 Primary open-angle glaucoma, left eye, moderate stage: Secondary | ICD-10-CM | POA: Diagnosis not present

## 2021-03-08 DIAGNOSIS — H401111 Primary open-angle glaucoma, right eye, mild stage: Secondary | ICD-10-CM | POA: Diagnosis not present

## 2021-03-08 DIAGNOSIS — H26492 Other secondary cataract, left eye: Secondary | ICD-10-CM | POA: Diagnosis not present

## 2021-04-18 DIAGNOSIS — H401111 Primary open-angle glaucoma, right eye, mild stage: Secondary | ICD-10-CM | POA: Diagnosis not present

## 2021-04-18 DIAGNOSIS — H401122 Primary open-angle glaucoma, left eye, moderate stage: Secondary | ICD-10-CM | POA: Diagnosis not present

## 2021-04-28 ENCOUNTER — Other Ambulatory Visit: Payer: Self-pay

## 2021-04-28 ENCOUNTER — Other Ambulatory Visit (HOSPITAL_COMMUNITY): Payer: Self-pay | Admitting: Cardiovascular Disease

## 2021-04-28 ENCOUNTER — Ambulatory Visit (HOSPITAL_COMMUNITY)
Admission: RE | Admit: 2021-04-28 | Discharge: 2021-04-28 | Disposition: A | Discharge status: Home / Self Care | Payer: PPO | Source: Ambulatory Visit | Attending: Cardiology | Admitting: Cardiology

## 2021-04-28 DIAGNOSIS — I6523 Occlusion and stenosis of bilateral carotid arteries: Secondary | ICD-10-CM

## 2021-05-03 ENCOUNTER — Ambulatory Visit: Payer: PPO | Admitting: Physician Assistant

## 2021-05-03 ENCOUNTER — Other Ambulatory Visit: Payer: Self-pay

## 2021-05-03 ENCOUNTER — Encounter: Payer: Self-pay | Admitting: Physician Assistant

## 2021-05-03 VITALS — BP 134/68 | HR 62 | Ht 67.0 in | Wt 162.0 lb

## 2021-05-03 DIAGNOSIS — I10 Essential (primary) hypertension: Secondary | ICD-10-CM

## 2021-05-03 DIAGNOSIS — I2510 Atherosclerotic heart disease of native coronary artery without angina pectoris: Secondary | ICD-10-CM | POA: Diagnosis not present

## 2021-05-03 DIAGNOSIS — I6523 Occlusion and stenosis of bilateral carotid arteries: Secondary | ICD-10-CM | POA: Diagnosis not present

## 2021-05-03 DIAGNOSIS — E782 Mixed hyperlipidemia: Secondary | ICD-10-CM

## 2021-05-03 NOTE — Patient Instructions (Signed)

## 2021-05-03 NOTE — Progress Notes (Signed)
Cardiology Office Note:    Date:  05/03/2021   ID:  Xavier, Bell [birth year], MRN [id number]  PCP:  Horald Pollen, MD  [facility] Cardiologist:  Mertie Moores, MD  [place] Electrophysiologist:  None   Chief Complaint: 3 months follow up   History of Present Illness:    Xavier Bell is a 80 y.o. male with a hx of coronary artery disease s/p multiple PCI and subsequent CABG in 1996,  paroxysmal atrial flutter s/p cardioversion in 2013 s/p ablation in 2015, hypertension, hyperlipidemia, carotid artery disease seen for follow-up.  Last carotid Doppler May 2022 with mild right ICA stenosis and moderate left ICA stenosis.  Patient is here for follow-up.  Blood pressure well controlled overall.  He exercises 3-4 times per week without any angina.  He denies chest pain, shortness of breath, orthopnea, PND, syncope, lower extremity edema or melena.  Compliant with his medication.  Past Medical History:  Diagnosis Date  . Atrial flutter ([place])    s/p cardioversion 02/08/12; ablation by [name] [phone]  . Chronic anticoagulation   . Coronary artery disease    s/p remote MI in 1994 with multiple PCI and subsequent CABG x 5 in 1996  . Dyslipidemia   . Hypertension   . MI (myocardial infarction) ([place]) 1995  . Normal nuclear stress test 2006  . Pseudoaphakia    both eyes  . Retinal detachment     Past Surgical History:  Procedure Laterality Date  . ATRIAL FLUTTER ABLATION N/A 12/08/2014   CTI by [name]  . CARDIAC CATHETERIZATION  1996  . CARDIOVERSION  02/08/2012   Procedure: CARDIOVERSION;  Surgeon: Darden Amber., MD;  Location: [facility] OR;  Service: Cardiovascular;  Laterality: N/A;  . CORONARY ARTERY BYPASS GRAFT  1996   LIMA to LAD, SVG to RV marginal & PD, SVG  to DX and SVG to Ramus  . ORIF ANKLE FRACTURE Left 12/10/2019   Procedure: Open Reduction Internal fixation (ORIF) Left Ankle Fracture;  Surgeon: [name], MD;  Location: [place];  Service: Orthopedics;  Laterality: Left;  . TONSILLECTOMY AND ADENOIDECTOMY      Current Medications: Current Meds  Medication Sig  . amLODipine (NORVASC) 10 MG tablet Take 1 tablet (10 mg total) by mouth daily.  [name] aspirin 81 MG tablet Take 81 mg by mouth daily.  [name] atorvastatin (LIPITOR) 40 MG tablet Take 1 tablet by mouth daily  . BRIMONIDINE TARTRATE OP Apply 1 drop to eye daily in the afternoon.  . carvedilol (COREG) 25 MG tablet Take 1 tablet (25 mg total) by mouth 2 (two) times daily.  . chlorthalidone (HYGROTON) 25 MG tablet Take 1 tablet (25 mg total) by mouth daily.  . Cholecalciferol (VITAMIN D) 2000 units tablet Take 2,000 Units by mouth daily.  . dorzolamide-timolol (COSOPT) 22.3-6.8 MG/ML ophthalmic solution Place 2 drops into both eyes 2 (two) times daily.  . fenofibrate (TRICOR) 145 MG tablet Take 1 tablet (145 mg total) by mouth daily.  [name] latanoprost (XALATAN) 0.005 % ophthalmic solution Place 1 drop into both eyes at bedtime.   [name] loratadine (CLARITIN) 10 MG tablet Take 10 mg by mouth daily.  . Multiple Vitamin (MULTIVITAMIN) tablet Take 1 tablet by mouth daily.  . Omega-3 Fatty Acids (OMEGA 3 PO) Take 1 capsule by mouth daily.  . potassium chloride SA (KLOR-CON) 20 MEQ tablet Take 1 tablet by mouth daily  . Probiotic Product (PROBIOTIC BLEND PO) Take 1 tablet by  mouth daily at 12 noon.  . valsartan (DIOVAN) 320 MG tablet Take 1 tablet (320 mg total) by mouth daily.  . Zinc 50 MG CAPS Take 50 mg by mouth daily.     Allergies:   Brimonidine and Niacin and related   Social History   Socioeconomic History  . Marital status: Married    Spouse name: Not on file  . Number of children: Not on file  . Years of education: Not on file  . Highest education level: Not on file  Occupational History  . Not on file  Tobacco Use  . Smoking status: Never Smoker  . Smokeless tobacco: Never Used  Vaping Use  . Vaping Use: Never used  Substance and Sexual  Activity  . Alcohol use: No  . Drug use: No  . Sexual activity: Not on file  Other Topics Concern  . Not on file  Social History Narrative  . Not on file   Social Determinants of Health   Financial Resource Strain: Not on file  Food Insecurity: Not on file  Transportation Needs: Not on file  Physical Activity: Not on file  Stress: Not on file  Social Connections: Not on file     Family History: The patient's family history includes Coronary artery disease in an other family member; Heart disease in his father and mother.   ROS:   Please see the history of present illness.    All other systems reviewed and are negative.   EKGs/Labs/Other Studies Reviewed:    The following studies were reviewed today: Carotid doppler 04/2021 Summary:  Right Carotid: Velocities in the right ICA are consistent with a 1-39%  stenosis.   Left Carotid: Velocities in the left ICA are consistent with a 40-59%  stenosis.        The ECA appears >50% stenosed.   Vertebrals: Bilateral vertebral arteries demonstrate antegrade flow.  Subclavians: Right subclavian artery was stenotic. Normal flow  hemodynamics were        seen in the left subclavian artery.   Echo 10/2011 Study Conclusions   - Left ventricle: The cavity size was normal. Wall thickness  was normal. Systolic function was normal. The estimated  ejection fraction was in the range of 60% to 65%.  - Mitral valve: Mild regurgitation.  - Left atrium: The atrium was mildly to moderately dilated.  - Tricuspid valve: Moderate regurgitation.    EKG:  EKG is not  ordered today.    Recent Labs: 03/02/2021: ALT 24; BUN 31; Creatinine, Ser 1.25; Potassium 4.4; Sodium 140  Recent Lipid Panel    Component Value Date/Time   CHOL 100 03/02/2021 0857   TRIG 89 03/02/2021 0857   HDL 31 (L) 03/02/2021 0857   CHOLHDL 3.2 03/02/2021 0857   CHOLHDL 2.7 05/28/2016 0816   VLDL 16 05/28/2016 0816   LDLCALC 51 03/02/2021 0857     Physical Exam:    VS:  BP 134/68   Pulse 62   Ht 5\' 7"  (1.702 m)   Wt 162 lb (73.5 kg)   SpO2 95%   BMI 25.37 kg/m     Wt Readings from Last 3 Encounters:  05/03/21 162 lb (73.5 kg)  02/08/21 168 lb (76.2 kg)  08/15/20 166 lb 3.2 oz (75.4 kg)     GEN: Well nourished, well developed in no acute distress HEENT: Normal NECK: No JVD; No carotid bruits LYMPHATICS: No lymphadenopathy CARDIAC: RRR, no murmurs, rubs, gallops RESPIRATORY:  Clear to auscultation without rales, wheezing or  rhonchi  ABDOMEN: Soft, non-tender, non-distended MUSCULOSKELETAL:  No edema; No deformity  SKIN: Warm and dry NEUROLOGIC:  Alert and oriented x 3 PSYCHIATRIC:  Normal affect   ASSESSMENT AND PLAN:    1. CAD s/p PCI and ultimately CABG No angina.  Continue aspirin, statin and beta-blocker.  2.  Remote history of atrial flutter s/p ablation by Dr. Lovena Le in 2015 -No longer on anticoagulation -Denies palpitation  3.  Hypertension -Blood pressure well controlled on multiple antihypertensive -No change  4.  Hyperlipidemia -LDL 51 on 02/2021 -Continue Lipitor 40 mg daily  5.  Carotid artery disease - Last carotid Doppler May 2022 with mild right ICA stenosis and moderate left ICA stenosis. -No neurological symptoms -Continue aspirin and statin  Medication Adjustments/Labs and Tests Ordered: Current medicines are reviewed at length with the patient today.  Concerns regarding medicines are outlined above.  No orders of the defined types were placed in this encounter.  No orders of the defined types were placed in this encounter.   Patient Instructions  Medication Instructions:  Your physician recommends that you continue on your current medications as directed. Please refer to the Current Medication list given to you today.  *If you need a refill on your cardiac medications before your next appointment, please call your pharmacy*   Lab Work: None ordered  If you have labs  (blood work) drawn today and your tests are completely normal, you will receive your results only by: [name] MyChart Message (if you have MyChart) OR . A paper copy in the mail If you have any lab test that is abnormal or we need to change your treatment, we will call you to review the results.   Testing/Procedures: None ordered   Follow-Up: At [facility], you and your health needs are our priority.  As part of our continuing mission to provide you with exceptional heart care, we have created designated Provider Care Teams.  These Care Teams include your primary Cardiologist (physician) and Advanced Practice Providers (APPs -  Physician Assistants and Nurse Practitioners) who all work together to provide you with the care you need, when you need it.  We recommend signing up for the patient portal called "MyChart".  Sign up information is provided on this After Visit Summary.  MyChart is used to connect with patients for Virtual Visits (Telemedicine).  Patients are able to view lab/test results, encounter notes, upcoming appointments, etc.  Non-urgent messages can be sent to your provider as well.   To learn more about what you can do with MyChart, go to [email or web address].    Your next appointment:   6 month(s)  The format for your next appointment:   In Person  Provider:   You may see Mertie Moores, MD or one of the following Advanced Practice Providers on your designated Care Team:    Richardson Dopp, PA-C  Robbie Lis, PA-C    Other Instructions      Signed, Leanor Kail, [place]  05/03/2021 10:21 AM    [place]

## 2021-05-29 ENCOUNTER — Other Ambulatory Visit: Payer: Self-pay

## 2021-05-29 MED ORDER — ATORVASTATIN CALCIUM 40 MG PO TABS: 1.0000 | ORAL_TABLET | Freq: Every day | ORAL | 3 refills | Status: DC

## 2021-05-31 ENCOUNTER — Other Ambulatory Visit: Payer: Self-pay | Admitting: Cardiovascular Disease

## 2021-06-14 DIAGNOSIS — H401122 Primary open-angle glaucoma, left eye, moderate stage: Secondary | ICD-10-CM | POA: Diagnosis not present

## 2021-06-14 DIAGNOSIS — H401111 Primary open-angle glaucoma, right eye, mild stage: Secondary | ICD-10-CM | POA: Diagnosis not present

## 2021-07-03 ENCOUNTER — Telehealth: Payer: Self-pay | Admitting: Cardiovascular Disease

## 2021-07-03 ENCOUNTER — Other Ambulatory Visit: Payer: Self-pay

## 2021-07-03 DIAGNOSIS — L03032 Cellulitis of left toe: Secondary | ICD-10-CM | POA: Insufficient documentation

## 2021-07-03 MED ORDER — POTASSIUM CHLORIDE CRYS ER 20 MEQ PO TBCR
20.0000 meq | EXTENDED_RELEASE_TABLET | Freq: Every day | ORAL | 1 refills | Status: DC
Start: 2021-07-03 — End: 2021-11-09

## 2021-07-03 NOTE — Telephone Encounter (Signed)
*  STAT* If patient is at the pharmacy, call can be transferred to refill team.   1. Which medications need to be refilled? (please list name of each medication and dose if known) potassium chloride SA (KLOR-CON) 20 MEQ tablet  2. Which pharmacy/location (including street and city if local pharmacy) is medication to be sent to? [facility] - [place], [place]  3. Do they need a 30 day or 90 day supply? 90 day supply  Pt is out of this medication

## 2021-07-19 DIAGNOSIS — L03032 Cellulitis of left toe: Secondary | ICD-10-CM | POA: Diagnosis not present

## 2021-10-13 ENCOUNTER — Other Ambulatory Visit: Payer: Self-pay

## 2021-10-13 MED ORDER — CHLORTHALIDONE 25 MG PO TABS: 25.0000 mg | ORAL_TABLET | Freq: Every day | ORAL | 2 refills | Status: DC

## 2021-10-18 DIAGNOSIS — H52203 Unspecified astigmatism, bilateral: Secondary | ICD-10-CM | POA: Diagnosis not present

## 2021-10-18 DIAGNOSIS — H524 Presbyopia: Secondary | ICD-10-CM | POA: Diagnosis not present

## 2021-10-18 DIAGNOSIS — H5213 Myopia, bilateral: Secondary | ICD-10-CM | POA: Diagnosis not present

## 2021-10-29 ENCOUNTER — Encounter: Payer: Self-pay | Admitting: Cardiovascular Disease

## 2021-10-29 NOTE — Progress Notes (Signed)
Cardiology Office Note   Date:  10/30/2021   ID:  Xavier Bell, Xavier Bell [birth year], MRN [id number]  PCP:  Horald Pollen, MD  Cardiologist:   Mertie Moores, MD   Chief Complaint  Patient presents with   Coronary Artery Disease        Hypertension   1. Coronary artery disease-status post CABG 2. Atrial flutter- s/p cardioversion 02/08/12 3. Hypertension 4. Right carotid bruit     Has a history of atrial flutter this past spring. He status post cardioversion. [name] done very well. He continues to take Xarelto.  [name] not been exercising nearly as much as he needs to. [name] also been careless with his diet and has picked up a few pounds.  March 06, 2013:  He is doing well.  No angina.  He is continuing to have foot problems - especially of the left foot.  He has intense pain in his heel when he walks.  Sept. 12, 2014:  He has done well.  Still not exercising as much.  Takes motrin at night.  He has left plantar faciatis.    March 19, 2014:  Xavier Bell is doing ok No CP or dyspnea.  No palpitations. He has gone back into atrial flutter.   He cannot tell that his HR is irreg.   He has a hx of a-flutter in the past ( 2014) and was treated with Xarelto and was cardioverted.   May 09, 2015 Xavier Bell is a 80 y.o. male who presents for follow up of his atrial flutter , HTN, CAD No CP , not exercising much .   Nov. 14, 2016: Doing well. BP is a bit high. Not much exercise ,  Sore feet.   May 28, 2016:  Doing well.  BP is normal  No CP , no dyspnea at rest.   Slight DOE with activity. Not exercising   Jan. 8, 2018:  Xavier Bell is seen today .   Doing well.   Not exercising much .   Knows that he needs to.  Aug. 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  No CP , no dyspnea   April 21, 2018:  Xavier Bell is seen back today for follow-up of his coronary artery disease and mild to moderate carotid artery disease.  Recent carotid duplex scan reveals moderate left  internal carotid plaque and mild right internal carotid disease.  Hx of hyperlipidemia ,    Had some back issues  - has 3 bulging discs, 1 was herniated  Back has improved some with conservative management .   Does not think he will need surgery any time soon.   No CP or dyspnea   November 03, 2018: She is seen today for follow-up of his coronary artery disease and moderate carotid artery disease.  Is a moderate left internal carotid plaque.  He has mild right internal carotid disease.  Is planning on selling his [place]  No CP or dyspnea Bp is a bit elevated  Has been eating more salt than he should   Aug. 19, 2020 :  Xavier Bell is seen today for follow up visit Has a hx of atrial flutter - is s/p ablation  .   - is in NSR  Today  BP is a bit elevated.  Has gained a few lbs.   February 15, 2020:  Xavier Bell is seen today for follow-up visit.  He has a history of coronary artery disease and is status post coronary artery bypass  grafting.  He has moderate carotid artery disease.  Said a history of atrial flutter and is status post ablation. Fell through his ceiling from the attic.   Broke his left ankle ,  Had surgical repair of his ankle  Otherwise doing ok No covid vaccine yet.     Aug. 23, 2021:  Xavier Bell is seen today for follow up of his CAD .  BP has been elevated.  He bought a BP cuff. And gets variable readings.  He does not trust the cuff.  I reviewed his recordings   Admits that his diet is not what is should be.  Still eating chips,  Cheese  No angina . No dyspnea   Carotid study is unchanged.   Labs from 3 days ago are stable   Feb. 16, 2022: Xavier Bell is seen today for follow up of his CAD  HTN HLD Is exercising some .   Trying to lose some weight . 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   BP log shows BP typically in the 140-150 range.   Nov. 7, 2022 Xavier Bell is seen today for follow up of  his CAD , HTN, HLD  Has had some HTN - especially at a time when he was eating lots of salt .  ( Wife was afway so he was eating lots of chips / salsa Diet has improved.  Records his sodium intake on legal pad.  Typically eats between 1000 - 1500 mg of sodium .  BP now is great.  Gave the ok to have oyster stew at Christmas.     Past Medical History:  Diagnosis Date   Atrial flutter ([place])    s/p cardioversion 02/08/12; ablation by [name] [phone]   Chronic anticoagulation    Coronary artery disease    s/p remote MI in 1994 with multiple PCI and subsequent CABG x 5 in 1996   Dyslipidemia    Hypertension    MI (myocardial infarction) ([place]) 1995   Normal nuclear stress test 2006   Pseudoaphakia    both eyes   Retinal detachment     Past Surgical History:  Procedure Laterality Date   ATRIAL FLUTTER ABLATION N/A 12/08/2014   CTI by [name]   CARDIAC CATHETERIZATION  1996   CARDIOVERSION  02/08/2012   Procedure: CARDIOVERSION;  Surgeon: Darden Amber., MD;  Location: [facility];  Service: Cardiovascular;  Laterality: N/A;   CORONARY ARTERY BYPASS GRAFT  1996   LIMA to LAD, SVG to RV marginal & PD, SVG  to DX and SVG to Ramus   ORIF ANKLE FRACTURE Left 12/10/2019   Procedure: Open Reduction Internal fixation (ORIF) Left Ankle Fracture;  Surgeon: [name], MD;  Location: [facility];  Service: Orthopedics;  Laterality: Left;   TONSILLECTOMY AND ADENOIDECTOMY       Current Outpatient Medications  Medication Sig Dispense Refill   amLODipine (NORVASC) 10 MG tablet Take 1 tablet (10 mg total) by mouth daily. 90 tablet 3   aspirin 81 MG tablet Take 81 mg by mouth daily.     atorvastatin (LIPITOR) 40 MG tablet Take 1 tablet (40 mg total) by mouth daily. 90 tablet 3   BRIMONIDINE TARTRATE OP Apply 1 drop to eye 2 (two) times daily.     carvedilol (COREG) 12.5 MG tablet Take 1 tablet (12.5 mg total) by mouth 2 (two) times daily. 180 tablet 3   chlorthalidone  (  HYGROTON) 25 MG tablet Take 1 tablet (25 mg total) by mouth daily. 90 tablet 2   Cholecalciferol (VITAMIN D) 2000 units tablet Take 2,000 Units by mouth daily.     dorzolamide-timolol (COSOPT) 22.3-6.8 MG/ML ophthalmic solution Place 2 drops into both eyes 2 (two) times daily.     fenofibrate (TRICOR) 145 MG tablet Take 1 tablet (145 mg total) by mouth daily. 90 tablet 3   latanoprost (XALATAN) 0.005 % ophthalmic solution Place 1 drop into both eyes at bedtime.      loratadine (CLARITIN) 10 MG tablet Take 10 mg by mouth daily.     Multiple Vitamin (MULTIVITAMIN) tablet Take 1 tablet by mouth daily.     Omega-3 Fatty Acids (OMEGA 3 PO) Take 1 capsule by mouth daily.     potassium chloride SA (KLOR-CON) 20 MEQ tablet Take 1 tablet (20 mEq total) by mouth daily. 90 tablet 1   Probiotic Product (PROBIOTIC BLEND PO) Take 1 tablet by mouth daily at 12 noon.     valsartan (DIOVAN) 320 MG tablet Take 1 tablet (320 mg total) by mouth daily. 90 tablet 3   Zinc 50 MG CAPS Take 50 mg by mouth daily.     No current facility-administered medications for this visit.    Allergies:   Brimonidine and Niacin and related    Social History:  The patient  reports that he has never smoked. He has never used smokeless tobacco. He reports that he does not drink alcohol and does not use drugs.   Family History:  The patient's family history includes Coronary artery disease in an other family member; Heart disease in his father and mother.    ROS:  Noted in current hx , otherwise , ROS is negative   Physical Exam: Blood pressure 128/70, pulse (!) 52, height 5\' 7"  (1.702 m), weight 157 lb (71.2 kg), SpO2 98 %.  GEN:  Well nourished, well developed in no acute distress HEENT: Normal NECK: No JVD; No carotid bruits LYMPHATICS: No lymphadenopathy CARDIAC: RRR , no murmurs, rubs, gallops RESPIRATORY:  Clear to auscultation without rales, wheezing or rhonchi  ABDOMEN: Soft, non-tender,  non-distended MUSCULOSKELETAL:  No edema; No deformity  SKIN: Warm and dry NEUROLOGIC:  Alert and oriented x 3    EKG:    .    Recent Labs: 03/02/2021: ALT 24; BUN 31; Creatinine, Ser 1.25; Potassium 4.4; Sodium 140    Lipid Panel    Component Value Date/Time   CHOL 100 03/02/2021 0857   TRIG 89 03/02/2021 0857   HDL 31 (L) 03/02/2021 0857   CHOLHDL 3.2 03/02/2021 0857   CHOLHDL 2.7 05/28/2016 0816   VLDL 16 05/28/2016 0816   LDLCALC 51 03/02/2021 0857      Wt Readings from Last 3 Encounters:  10/30/21 157 lb (71.2 kg)  05/03/21 162 lb (73.5 kg)  02/08/21 168 lb (76.2 kg)      Other studies Reviewed: Additional studies/ records that were reviewed today include: . Review of the above records demonstrates:    ASSESSMENT AND PLAN:  1. Coronary artery disease-      no angina . Cont meds.   2. 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   4. Right carotid bruit-        5. Hyperlipidemia:    -  labs have been stable  Current medicines are reviewed at length with the patient today.  The patient does not have concerns regarding medicines.     Labs/ tests ordered today include:   Orders Placed This Encounter  Procedures   Lipid panel   ALT   Basic metabolic panel   EKG 82-EQFD      Disposition:          Mertie Moores, MD  10/30/2021 10:36 AM    [facility] [place], [place], [place]  [address] Phone: [phone]; Fax: [phone]

## 2021-10-30 ENCOUNTER — Other Ambulatory Visit: Payer: Self-pay

## 2021-10-30 ENCOUNTER — Encounter: Payer: Self-pay | Admitting: Cardiovascular Disease

## 2021-10-30 ENCOUNTER — Ambulatory Visit: Payer: PPO | Admitting: Cardiovascular Disease

## 2021-10-30 VITALS — BP 128/70 | HR 52 | Ht 67.0 in | Wt 157.0 lb

## 2021-10-30 DIAGNOSIS — I10 Essential (primary) hypertension: Secondary | ICD-10-CM

## 2021-10-30 DIAGNOSIS — I2510 Atherosclerotic heart disease of native coronary artery without angina pectoris: Secondary | ICD-10-CM | POA: Diagnosis not present

## 2021-10-30 DIAGNOSIS — E782 Mixed hyperlipidemia: Secondary | ICD-10-CM | POA: Diagnosis not present

## 2021-10-30 LAB — BASIC METABOLIC PANEL
BUN/Creatinine Ratio: 31 — ABNORMAL HIGH (ref 10–24)
BUN: 47 mg/dL — ABNORMAL HIGH (ref 8–27)
CO2: 22 mmol/L (ref 20–29)
Calcium: 10 mg/dL (ref 8.6–10.2)
Chloride: 107 mmol/L — ABNORMAL HIGH (ref 96–106)
Creatinine, Ser: 1.54 mg/dL — ABNORMAL HIGH (ref 0.76–1.27)
Glucose: 108 mg/dL — ABNORMAL HIGH (ref 70–99)
Potassium: 4.9 mmol/L (ref 3.5–5.2)
Sodium: 139 mmol/L (ref 134–144)
eGFR: 45 mL/min/{1.73_m2} — ABNORMAL LOW (ref >59)

## 2021-10-30 LAB — LIPID PANEL
Chol/HDL Ratio: 3.7 ratio (ref 0.0–5.0)
Cholesterol, Total: 107 mg/dL (ref 100–199)
HDL: 29 mg/dL — ABNORMAL LOW (ref >39)
LDL Chol Calc (NIH): 56 mg/dL (ref 0–99)
Triglycerides: 124 mg/dL (ref 0–149)
VLDL Cholesterol Cal: 22 mg/dL (ref 5–40)

## 2021-10-30 LAB — ALT: ALT: 24 IU/L (ref 0–44)

## 2021-10-30 MED ORDER — CARVEDILOL 12.5 MG PO TABS: 12.5000 mg | ORAL_TABLET | Freq: Two times a day (BID) | ORAL | 3 refills | Status: DC

## 2021-10-30 NOTE — Patient Instructions (Signed)
Medication Instructions:  Your physician has recommended you make the following change in your medication: DECREASE: carvedilol (Coreg) to 12.5 mg by mouth twice daily  *If you need a refill on your cardiac medications before your next appointment, please call your pharmacy*   Lab Work: TODAY: Lipid panel, ALT, BMP If you have labs (blood work) drawn today and your tests are completely normal, you will receive your results only by: [place] (if you have MyChart) OR A paper copy in the mail If you have any lab test that is abnormal or we need to change your treatment, we will call you to review the results.   Testing/Procedures: NONE   Follow-Up: At [facility], you and your health needs are our priority.  As part of our continuing mission to provide you with exceptional heart care, we have created designated Provider Care Teams.  These Care Teams include your primary Cardiologist (physician) and Advanced Practice Providers (APPs -  Physician Assistants and Nurse Practitioners) who all work together to provide you with the care you need, when you need it.   Your next appointment:   1 year(s)  The format for your next appointment:   In Person  Provider:   Mertie Moores, MD, or APP 1}

## 2021-10-31 ENCOUNTER — Telehealth: Payer: Self-pay

## 2021-10-31 DIAGNOSIS — I10 Essential (primary) hypertension: Secondary | ICD-10-CM

## 2021-10-31 MED ORDER — CHLORTHALIDONE 25 MG PO TABS: 12.5000 mg | ORAL_TABLET | Freq: Every day | ORAL | 3 refills | Status: DC

## 2021-10-31 NOTE — Telephone Encounter (Signed)
-----   Message from Thayer Headings, MD sent at 10/31/2021  9:10 AM EST ----- Lipids look good.    BMP suggests that he is volume depleted. Lets have him cut his chlorthalidone in half and take 12.5 mg a day  If the tablets dont split easily, he may take chlorthalidone 25 mg 3 times a week (M,W,F)  Repeat his BMP in 1 month

## 2021-10-31 NOTE — Telephone Encounter (Signed)
The patient has been notified of the result and verbalized understanding.  All questions (if any) were answered. Antonieta Iba, RN 10/31/2021 9:21 AM  Patient states that he has a pill splitter and will cut his tablets in half and take daily. Repeat lab work has been scheduled.

## 2021-11-07 ENCOUNTER — Other Ambulatory Visit: Payer: Self-pay

## 2021-11-07 MED ORDER — AMLODIPINE BESYLATE 10 MG PO TABS: 10.0000 mg | ORAL_TABLET | Freq: Every day | ORAL | 3 refills | Status: DC

## 2021-11-07 MED ORDER — CARVEDILOL 12.5 MG PO TABS: 12.5000 mg | ORAL_TABLET | Freq: Two times a day (BID) | ORAL | 3 refills | Status: DC

## 2021-11-07 MED ORDER — ATORVASTATIN CALCIUM 40 MG PO TABS: 40.0000 mg | ORAL_TABLET | Freq: Every day | ORAL | 3 refills | Status: DC

## 2021-11-09 ENCOUNTER — Telehealth: Payer: Self-pay | Admitting: Cardiovascular Disease

## 2021-11-09 ENCOUNTER — Other Ambulatory Visit: Payer: Self-pay

## 2021-11-09 DIAGNOSIS — R7309 Other abnormal glucose: Secondary | ICD-10-CM

## 2021-11-09 MED ORDER — VALSARTAN 320 MG PO TABS: 320.0000 mg | ORAL_TABLET | Freq: Every day | ORAL | 3 refills | Status: DC

## 2021-11-09 MED ORDER — POTASSIUM CHLORIDE CRYS ER 20 MEQ PO TBCR: 20.0000 meq | EXTENDED_RELEASE_TABLET | Freq: Every day | ORAL | 3 refills | Status: DC

## 2021-11-09 MED ORDER — CHLORTHALIDONE 25 MG PO TABS: 12.5000 mg | ORAL_TABLET | Freq: Every day | ORAL | 3 refills | Status: DC

## 2021-11-09 NOTE — Telephone Encounter (Signed)
Patient would like to speak to nurse to go over his lab results.

## 2021-11-09 NOTE — Telephone Encounter (Signed)
Went through patient's lab results and explained to him what we look for and why Dr. Acie Fredrickson changed his medications. Patient was concerned about his glucose being a little over normal. Informed patient that there is a test we use to look at glucose over a longer period of time to see if he has any issues with becoming a diabetic called the Hgb A1c. Patient asked if he could have this test done. Since patient is already having lab work done next month for Dr. Acie Fredrickson, will see if we can add lab. Will forward to Dr. Acie Fredrickson.

## 2021-11-10 ENCOUNTER — Other Ambulatory Visit: Payer: Self-pay | Admitting: *Deleted

## 2021-11-10 MED ORDER — FENOFIBRATE 145 MG PO TABS: 145.0000 mg | ORAL_TABLET | Freq: Every day | ORAL | 3 refills | Status: DC

## 2021-11-13 NOTE — Telephone Encounter (Signed)
I'm ok with adding a HbA1C.  He really needs to discuss this with his primary MD as I dont treat DM   PN    Gave patient advisement and added lab to up coming lab visit. Patient verbalized understanding.

## 2021-11-30 ENCOUNTER — Other Ambulatory Visit: Payer: PPO

## 2021-11-30 ENCOUNTER — Other Ambulatory Visit: Payer: Self-pay

## 2021-11-30 DIAGNOSIS — R7309 Other abnormal glucose: Secondary | ICD-10-CM

## 2021-11-30 DIAGNOSIS — I10 Essential (primary) hypertension: Secondary | ICD-10-CM

## 2021-11-30 LAB — BASIC METABOLIC PANEL
BUN/Creatinine Ratio: 28 — ABNORMAL HIGH (ref 10–24)
BUN: 36 mg/dL — ABNORMAL HIGH (ref 8–27)
CO2: 21 mmol/L (ref 20–29)
Calcium: 9.7 mg/dL (ref 8.6–10.2)
Chloride: 106 mmol/L (ref 96–106)
Creatinine, Ser: 1.28 mg/dL — ABNORMAL HIGH (ref 0.76–1.27)
Glucose: 115 mg/dL — ABNORMAL HIGH (ref 70–99)
Potassium: 4.6 mmol/L (ref 3.5–5.2)
Sodium: 140 mmol/L (ref 134–144)
eGFR: 56 mL/min/{1.73_m2} — ABNORMAL LOW (ref >59)

## 2021-11-30 LAB — HEMOGLOBIN A1C
Est. average glucose Bld gHb Est-mCnc: 123 mg/dL
Hgb A1c MFr Bld: 5.9 % — ABNORMAL HIGH (ref 4.8–5.6)

## 2021-12-04 ENCOUNTER — Telehealth: Payer: Self-pay | Admitting: Cardiovascular Disease

## 2021-12-04 NOTE — Telephone Encounter (Signed)
Follow Up:   Patient said he had blood work last week. He said he would like to discuss his results with the nurse please.

## 2021-12-04 NOTE — Telephone Encounter (Signed)
Spoke with the patient in regards to his lab results. Patient is concerned about his A1C levels. I advised the patient to follow up with his PCP in regards to this. Patient states that he has been exercising for 30 minutes a day 6 days per week. He is also working on his diet.  Patient would like some information on diet recommendations to prevent diabetes. I will send patient some information.

## 2021-12-11 DIAGNOSIS — H401111 Primary open-angle glaucoma, right eye, mild stage: Secondary | ICD-10-CM | POA: Diagnosis not present

## 2021-12-11 DIAGNOSIS — H401122 Primary open-angle glaucoma, left eye, moderate stage: Secondary | ICD-10-CM | POA: Diagnosis not present

## 2021-12-27 ENCOUNTER — Ambulatory Visit (INDEPENDENT_AMBULATORY_CARE_PROVIDER_SITE_OTHER): Payer: Medicare HMO | Admitting: Emergency Medicine

## 2021-12-27 ENCOUNTER — Other Ambulatory Visit: Payer: Self-pay

## 2021-12-27 VITALS — BP 126/78 | HR 67 | Ht 67.0 in | Wt 156.0 lb

## 2021-12-27 DIAGNOSIS — E785 Hyperlipidemia, unspecified: Secondary | ICD-10-CM

## 2021-12-27 DIAGNOSIS — R7303 Prediabetes: Secondary | ICD-10-CM | POA: Diagnosis not present

## 2021-12-27 DIAGNOSIS — I10 Essential (primary) hypertension: Secondary | ICD-10-CM | POA: Diagnosis not present

## 2021-12-27 DIAGNOSIS — I779 Disorder of arteries and arterioles, unspecified: Secondary | ICD-10-CM

## 2021-12-27 DIAGNOSIS — I480 Paroxysmal atrial fibrillation: Secondary | ICD-10-CM

## 2021-12-27 DIAGNOSIS — N1831 Chronic kidney disease, stage 3a: Secondary | ICD-10-CM

## 2021-12-27 NOTE — Patient Instructions (Signed)
Health Maintenance After Age 83 After age 83, you are at a higher risk for certain long-term diseases and infections as well as injuries from falls. Falls are a major cause of broken bones and head injuries in people who are older than age 83. Getting regular preventive care can help to keep you healthy and well. Preventive care includes getting regular testing and making lifestyle changes as recommended by your health care provider. Talk with your health care provider about: Which screenings and tests you should have. A screening is a test that checks for a disease when you have no symptoms. A diet and exercise plan that is right for you. What should I know about screenings and tests to prevent falls? Screening and testing are the best ways to find a health problem early. Early diagnosis and treatment give you the best chance of managing medical conditions that are common after age 83. Certain conditions and lifestyle choices may make you more likely to have a fall. Your health care provider may recommend: Regular vision checks. Poor vision and conditions such as cataracts can make you more likely to have a fall. If you wear glasses, make sure to get your prescription updated if your vision changes. Medicine review. Work with your health care provider to regularly review all of the medicines you are taking, including over-the-counter medicines. Ask your health care provider about any side effects that may make you more likely to have a fall. Tell your health care provider if any medicines that you take make you feel dizzy or sleepy. Strength and balance checks. Your health care provider may recommend certain tests to check your strength and balance while standing, walking, or changing positions. Foot health exam. Foot pain and numbness, as well as not wearing proper footwear, can make you more likely to have a fall. Screenings, including: Osteoporosis screening. Osteoporosis is a condition that causes  the bones to get weaker and break more easily. Blood pressure screening. Blood pressure changes and medicines to control blood pressure can make you feel dizzy. Depression screening. You may be more likely to have a fall if you have a fear of falling, feel depressed, or feel unable to do activities that you used to do. Alcohol use screening. Using too much alcohol can affect your balance and may make you more likely to have a fall. Follow these instructions at home: Lifestyle Do not drink alcohol if: Your health care provider tells you not to drink. If you drink alcohol: Limit how much you have to: 0-1 drink a day for women. 0-2 drinks a day for men. Know how much alcohol is in your drink. In the [place] one drink equals one 12 oz bottle of beer (355 mL), one 5 oz glass of wine (148 mL), or one 1 oz glass of hard liquor (44 mL). Do not use any products that contain nicotine or tobacco. These products include cigarettes, chewing tobacco, and vaping devices, such as e-cigarettes. If you need help quitting, ask your health care provider. Activity  Follow a regular exercise program to stay fit. This will help you maintain your balance. Ask your health care provider what types of exercise are appropriate for you. If you need a cane or walker, use it as recommended by your health care provider. Wear supportive shoes that have nonskid soles. Safety  Remove any tripping hazards, such as rugs, cords, and clutter. Install safety equipment such as grab bars in bathrooms and safety rails on stairs. Keep rooms and walkways   well-lit. General instructions Talk with your health care provider about your risks for falling. Tell your health care provider if: You fall. Be sure to tell your health care provider about all falls, even ones that seem minor. You feel dizzy, tiredness (fatigue), or off-balance. Take over-the-counter and prescription medicines only as told by your health care provider. These include  supplements. Eat a healthy diet and maintain a healthy weight. A healthy diet includes low-fat dairy products, low-fat (lean) meats, and fiber from whole grains, beans, and lots of fruits and vegetables. Stay current with your vaccines. Schedule regular health, dental, and eye exams. Summary Having a healthy lifestyle and getting preventive care can help to protect your health and wellness after age 83. Screening and testing are the best way to find a health problem early and help you avoid having a fall. Early diagnosis and treatment give you the best chance for managing medical conditions that are more common for people who are older than age 83. Falls are a major cause of broken bones and head injuries in people who are older than age 83. Take precautions to prevent a fall at home. Work with your health care provider to learn what changes you can make to improve your health and wellness and to prevent falls. This information is not intended to replace advice given to you by your health care provider. Make sure you discuss any questions you have with your health care provider. Document Revised: 05/01/2021 Document Reviewed: 05/01/2021 Elsevier Patient Education  2022 Elsevier Inc.  

## 2021-12-27 NOTE — Progress Notes (Incomplete)
Xavier Bell 81 y.o.   Chief Complaint  Patient presents with   Diabetes    Discuss dibetes, last A1c 11/30/21, 5.9    HISTORY OF PRESENT ILLNESS: This is a 81 y.o. male here to reestablish care with me. Last office visit January 2019. Sees cardiologist on a regular basis. Has history of coronary artery disease, dyslipidemia, hypertension, history of A. fib status post ablation. Doing well.  Has questions regarding diabetes.  Last hemoglobin A1c 4 weeks ago at 5.9. No other complaints or medical concerns today.  HPI   Prior to Admission medications   Medication Sig Start Date End Date Taking? Authorizing Provider  amLODipine (NORVASC) 10 MG tablet Take 1 tablet (10 mg total) by mouth daily. 11/07/21   [name], [name], MD  aspirin 81 MG tablet Take 81 mg by mouth daily.    [provider]  atorvastatin (LIPITOR) 40 MG tablet Take 1 tablet (40 mg total) by mouth daily. 11/07/21   [name], [name], MD  BRIMONIDINE TARTRATE OP Apply 1 drop to eye 2 (two) times daily.    [provider]  carvedilol (COREG) 12.5 MG tablet Take 1 tablet (12.5 mg total) by mouth 2 (two) times daily. 11/07/21   [name], [name], MD  chlorthalidone (HYGROTON) 25 MG tablet Take 0.5 tablets (12.5 mg total) by mouth daily. 11/09/21   [name], [name], MD  Cholecalciferol (VITAMIN D) 2000 units tablet Take 2,000 Units by mouth daily.    [provider]  dorzolamide-timolol (COSOPT) 22.3-6.8 MG/ML ophthalmic solution Place 2 drops into both eyes 2 (two) times daily.    [provider]  fenofibrate (TRICOR) 145 MG tablet Take 1 tablet (145 mg total) by mouth daily. 11/10/21   [name], [name], MD  latanoprost (XALATAN) 0.005 % ophthalmic solution Place 1 drop into both eyes at bedtime.  07/15/17   [provider]  loratadine (CLARITIN) 10 MG tablet Take 10 mg by mouth daily.    [provider]  Multiple Vitamin (MULTIVITAMIN) tablet Take 1 tablet by mouth  daily.    [provider]  Omega-3 Fatty Acids (OMEGA 3 PO) Take 1 capsule by mouth daily.    [provider]  potassium chloride SA (KLOR-CON) 20 MEQ tablet Take 1 tablet (20 mEq total) by mouth daily. 11/09/21 11/04/22  [name], [name], MD  Probiotic Product (PROBIOTIC BLEND PO) Take 1 tablet by mouth daily at 12 noon.    [provider]  valsartan (DIOVAN) 320 MG tablet Take 1 tablet (320 mg total) by mouth daily. 11/09/21   [name], [name], MD  Zinc 50 MG CAPS Take 50 mg by mouth daily.    [provider]    Allergies  Allergen Reactions   Brimonidine     unknown   Niacin And Related Other (See Comments)    Stomach Pain and flushing     Patient Active Problem List   Diagnosis Date Noted   Sciatica of left side 01/18/2018   Mixed hyperlipidemia 12/31/2016   Carotid artery disease ([place]) 09/04/2013   HTN (hypertension) 12/31/2011   Vitamin D deficiency 12/31/2011   Atrial flutter ([place]) 11/09/2011   Coronary artery disease    Hypertension    Dyslipidemia    MI (myocardial infarction) ([place])    Retinal detachment    Pseudoaphakia    Atrial fibrillation [facility])     Past Medical History:  Diagnosis Date   Atrial flutter ([place])    s/p cardioversion 02/08/12; ablation  by [name] [phone]   Chronic anticoagulation    Coronary artery disease    s/p remote MI in 1994 with multiple PCI and subsequent CABG x 5 in 1996   Dyslipidemia    Hypertension    MI (myocardial infarction) ([place]) 1995   Normal nuclear stress test 2006   Pseudoaphakia    both eyes   Retinal detachment     Past Surgical History:  Procedure Laterality Date   ATRIAL FLUTTER ABLATION N/A 12/08/2014   CTI by [name]   CARDIAC CATHETERIZATION  1996   CARDIOVERSION  02/08/2012   Procedure: CARDIOVERSION;  Surgeon: Darden Amber., MD;  Location: [place];  Service: Cardiovascular;  Laterality: N/A;   CORONARY ARTERY BYPASS GRAFT  1996   LIMA to  LAD, SVG to RV marginal & PD, SVG  to DX and SVG to Ramus   ORIF ANKLE FRACTURE Left 12/10/2019   Procedure: Open Reduction Internal fixation (ORIF) Left Ankle Fracture;  Surgeon: [name], MD;  Location: [place];  Service: Orthopedics;  Laterality: Left;   TONSILLECTOMY AND ADENOIDECTOMY      Social History   Socioeconomic History   Marital status: Married    Spouse name: Not on file   Number of children: Not on file   Years of education: Not on file   Highest education level: Not on file  Occupational History   Not on file  Tobacco Use   Smoking status: Never   Smokeless tobacco: Never  Vaping Use   Vaping Use: Never used  Substance and Sexual Activity   Alcohol use: No   Drug use: No   Sexual activity: Not on file  Other Topics Concern   Not on file  Social History Narrative   Not on file   Social Determinants of Health   Financial Resource Strain: Not on file  Food Insecurity: Not on file  Transportation Needs: Not on file  Physical Activity: Not on file  Stress: Not on file  Social Connections: Not on file  Intimate Partner Violence: Not on file    Family History  Problem Relation Age of Onset   Heart disease Mother    Heart disease Father    Coronary artery disease Other      ROS   Physical Exam   ASSESSMENT & PLAN: ***   Agustina Caroli, MD [facility]

## 2022-01-01 ENCOUNTER — Encounter: Payer: Self-pay | Admitting: Emergency Medicine

## 2022-01-01 DIAGNOSIS — R7303 Prediabetes: Secondary | ICD-10-CM | POA: Insufficient documentation

## 2022-01-01 DIAGNOSIS — N1831 Chronic kidney disease, stage 3a: Secondary | ICD-10-CM | POA: Insufficient documentation

## 2022-01-01 HISTORY — DX: Prediabetes: R73.03

## 2022-01-01 NOTE — Assessment & Plan Note (Signed)
Stable and well-controlled. BP Readings from Last 3 Encounters:  12/27/21 126/78  10/30/21 128/70  05/03/21 134/68  Continue amlodipine, chlorthalidone, and valsartan.

## 2022-01-01 NOTE — Assessment & Plan Note (Signed)
Stable.  Continue atorvastatin 40 mg daily. ° °

## 2022-01-01 NOTE — Progress Notes (Signed)
Xavier Bell 81 y.o.   Chief Complaint  Patient presents with   Diabetes    Discuss dibetes, last A1c 11/30/21, 5.9    HISTORY OF PRESENT ILLNESS: This is a 81 y.o. male here to reestablish care with me. Concerned about diabetes. Problem list reviewed with patient. Has history of hypertension, paroxysmal atrial fibrillation, dyslipidemia, and coronary artery disease. Stable.  No other complaints or medical concerns today.  Diabetes Pertinent negatives for hypoglycemia include no dizziness or headaches. Pertinent negatives for diabetes include no chest pain.    Prior to Admission medications   Medication Sig Start Date End Date Taking? Authorizing Provider  amLODipine (NORVASC) 10 MG tablet Take 1 tablet (10 mg total) by mouth daily. 11/07/21  Yes [name], [name], MD  aspirin 81 MG tablet Take 81 mg by mouth daily.   Yes [provider]  atorvastatin (LIPITOR) 40 MG tablet Take 1 tablet (40 mg total) by mouth daily. 11/07/21  Yes [name], [name], MD  BRIMONIDINE TARTRATE OP Apply 1 drop to eye 2 (two) times daily.   Yes [provider]  carvedilol (COREG) 12.5 MG tablet Take 1 tablet (12.5 mg total) by mouth 2 (two) times daily. 11/07/21  Yes [name], [name], MD  chlorthalidone (HYGROTON) 25 MG tablet Take 0.5 tablets (12.5 mg total) by mouth daily. 11/09/21  Yes [name], [name], MD  Cholecalciferol (VITAMIN D) 2000 units tablet Take 2,000 Units by mouth daily.   Yes [provider]  dorzolamide-timolol (COSOPT) 22.3-6.8 MG/ML ophthalmic solution Place 2 drops into both eyes 2 (two) times daily.   Yes [provider]  fenofibrate (TRICOR) 145 MG tablet Take 1 tablet (145 mg total) by mouth daily. 11/10/21  Yes [name], [name], MD  latanoprost (XALATAN) 0.005 % ophthalmic solution Place 1 drop into both eyes at bedtime.  07/15/17  Yes [provider]  loratadine (CLARITIN) 10 MG tablet Take 10 mg by mouth daily.   Yes [provider]  Multiple Vitamin (MULTIVITAMIN) tablet Take 1 tablet by mouth daily.   Yes [provider]  Omega-3 Fatty Acids (OMEGA 3 PO) Take 1 capsule by mouth daily.   Yes [provider]  potassium chloride SA (KLOR-CON) 20 MEQ tablet Take 1 tablet (20 mEq total) by mouth daily. 11/09/21 11/04/22 Yes [name], [name], MD  Probiotic Product (PROBIOTIC BLEND PO) Take 1 tablet by mouth daily at 12 noon.   Yes [provider]  valsartan (DIOVAN) 320 MG tablet Take 1 tablet (320 mg total) by mouth daily. 11/09/21  Yes [name], [name], MD  Zinc 50 MG CAPS Take 50 mg by mouth daily.   Yes [provider]    Allergies  Allergen Reactions   Brimonidine     unknown   Niacin And Related Other (See Comments)    Stomach Pain and flushing     Patient Active Problem List   Diagnosis Date Noted   Prediabetes 01/01/2022   Stage 3a chronic kidney disease ([place]) 01/01/2022   Sciatica of left side 01/18/2018   Mixed hyperlipidemia 12/31/2016   Carotid artery disease ([place]) 09/04/2013   HTN (hypertension) 12/31/2011   Vitamin D deficiency 12/31/2011   Atrial flutter ([place]) 11/09/2011   Coronary artery disease    Hypertension    Dyslipidemia    MI (myocardial infarction) ([place])    Retinal detachment    Pseudoaphakia    Atrial fibrillation [facility])     Past Medical History:  Diagnosis Date  Atrial flutter ([place])    s/p cardioversion 02/08/12; ablation by [name] [phone]   Chronic anticoagulation    Coronary artery disease    s/p remote MI in 1994 with multiple PCI and subsequent CABG x 5 in 1996   Dyslipidemia    Hypertension    MI (myocardial infarction) ([place]) 1995   Normal nuclear stress test 2006   Pseudoaphakia    both eyes   Retinal detachment     Past Surgical History:  Procedure Laterality Date   ATRIAL FLUTTER ABLATION N/A 12/08/2014   CTI by [name]   CARDIAC CATHETERIZATION  1996   CARDIOVERSION  02/08/2012   Procedure:  CARDIOVERSION;  Surgeon: Darden Amber., MD;  Location: [place];  Service: Cardiovascular;  Laterality: N/A;   CORONARY ARTERY BYPASS GRAFT  1996   LIMA to LAD, SVG to RV marginal & PD, SVG  to DX and SVG to Ramus   ORIF ANKLE FRACTURE Left 12/10/2019   Procedure: Open Reduction Internal fixation (ORIF) Left Ankle Fracture;  Surgeon: [name], MD;  Location: [place];  Service: Orthopedics;  Laterality: Left;   TONSILLECTOMY AND ADENOIDECTOMY      Social History   Socioeconomic History   Marital status: Married    Spouse name: Not on file   Number of children: Not on file   Years of education: Not on file   Highest education level: Not on file  Occupational History   Not on file  Tobacco Use   Smoking status: Never   Smokeless tobacco: Never  Vaping Use   Vaping Use: Never used  Substance and Sexual Activity   Alcohol use: No   Drug use: No   Sexual activity: Not on file  Other Topics Concern   Not on file  Social History Narrative   Not on file   Social Determinants of Health   Financial Resource Strain: Not on file  Food Insecurity: Not on file  Transportation Needs: Not on file  Physical Activity: Not on file  Stress: Not on file  Social Connections: Not on file  Intimate Partner Violence: Not on file    Family History  Problem Relation Age of Onset   Heart disease Mother    Heart disease Father    Coronary artery disease Other      Review of Systems  Constitutional: Negative.  Negative for chills and fever.  HENT: Negative.  Negative for congestion and sore throat.   Respiratory: Negative.  Negative for cough and shortness of breath.   Cardiovascular: Negative.  Negative for chest pain and palpitations.  Gastrointestinal: Negative.  Negative for abdominal pain, diarrhea, nausea and vomiting.  Genitourinary: Negative.  Negative for hematuria.  Skin: Negative.  Negative for rash.  Neurological: Negative.  Negative for dizziness and  headaches.  All other systems reviewed and are negative.  Today's Vitals   12/27/21 1324  BP: 126/78  Pulse: 67  SpO2: 98%  Weight: 156 lb (70.8 kg)  Height: 5\' 7"  (1.702 m)   Body mass index is 24.43 kg/m.  Physical Exam Vitals reviewed.  Constitutional:      Appearance: Normal appearance.  HENT:     Head: Normocephalic.  Eyes:     Extraocular Movements: Extraocular movements intact.     Pupils: Pupils are equal, round, and reactive to light.  Cardiovascular:     Rate and Rhythm: Normal rate. Rhythm irregular.     Pulses: Normal pulses.     Heart sounds: Normal  heart sounds.  Pulmonary:     Effort: Pulmonary effort is normal.     Breath sounds: Normal breath sounds.  Abdominal:     Palpations: Abdomen is soft.     Tenderness: There is no abdominal tenderness.  Musculoskeletal:        General: Normal range of motion.     Cervical back: No tenderness.  Lymphadenopathy:     Cervical: No cervical adenopathy.  Skin:    General: Skin is warm and dry.     Capillary Refill: Capillary refill takes less than 2 seconds.  Neurological:     General: No focal deficit present.     Mental Status: He is alert and oriented to person, place, and time.  Psychiatric:        Mood and Affect: Mood normal.        Behavior: Behavior normal.     ASSESSMENT & PLAN: Problem List Items Addressed This Visit       Cardiovascular and Mediastinum   Hypertension    Stable and well-controlled. BP Readings from Last 3 Encounters:  12/27/21 126/78  10/30/21 128/70  05/03/21 134/68  Continue amlodipine, chlorthalidone, and valsartan.       Atrial fibrillation ([place]) - Primary    Stable with well-controlled rate. Continue carvedilol 12.5 mg twice a day.      Carotid artery disease ([facility])     Genitourinary   Stage 3a chronic kidney disease ([facility])    Stable.  Advised to stay well-hydrated and avoid NSAIDs.        Other   Dyslipidemia    Stable.  Continue atorvastatin 40 mg  daily.      Prediabetes    Stable.  Diet and nutrition discussed. Lab Results  Component Value Date   HGBA1C 5.9 (H) 11/30/2021         Patient Instructions  Health Maintenance After Age 43 After age 15, you are at a higher risk for certain long-term diseases and infections as well as injuries from falls. Falls are a major cause of broken bones and head injuries in people who are older than age 2. Getting regular preventive care can help to keep you healthy and well. Preventive care includes getting regular testing and making lifestyle changes as recommended by your health care provider. Talk with your health care provider about: Which screenings and tests you should have. A screening is a test that checks for a disease when you have no symptoms. A diet and exercise plan that is right for you. What should I know about screenings and tests to prevent falls? Screening and testing are the best ways to find a health problem early. Early diagnosis and treatment give you the best chance of managing medical conditions that are common after age 29. Certain conditions and lifestyle choices may make you more likely to have a fall. Your health care provider may recommend: Regular vision checks. Poor vision and conditions such as cataracts can make you more likely to have a fall. If you wear glasses, make sure to get your prescription updated if your vision changes. Medicine review. Work with your health care provider to regularly review all of the medicines you are taking, including over-the-counter medicines. Ask your health care provider about any side effects that may make you more likely to have a fall. Tell your health care provider if any medicines that you take make you feel dizzy or sleepy. Strength and balance checks. Your health care provider may recommend certain tests to  check your strength and balance while standing, walking, or changing positions. Foot health exam. Foot pain and numbness,  as well as not wearing proper footwear, can make you more likely to have a fall. Screenings, including: Osteoporosis screening. Osteoporosis is a condition that causes the bones to get weaker and break more easily. Blood pressure screening. Blood pressure changes and medicines to control blood pressure can make you feel dizzy. Depression screening. You may be more likely to have a fall if you have a fear of falling, feel depressed, or feel unable to do activities that you used to do. Alcohol use screening. Using too much alcohol can affect your balance and may make you more likely to have a fall. Follow these instructions at home: Lifestyle Do not drink alcohol if: Your health care provider tells you not to drink. If you drink alcohol: Limit how much you have to: 0-1 drink a day for women. 0-2 drinks a day for men. Know how much alcohol is in your drink. In the [place] one drink equals one 12 oz bottle of beer (355 mL), one 5 oz glass of wine (148 mL), or one 1 oz glass of hard liquor (44 mL). Do not use any products that contain nicotine or tobacco. These products include cigarettes, chewing tobacco, and vaping devices, such as e-cigarettes. If you need help quitting, ask your health care provider. Activity  Follow a regular exercise program to stay fit. This will help you maintain your balance. Ask your health care provider what types of exercise are appropriate for you. If you need a cane or walker, use it as recommended by your health care provider. Wear supportive shoes that have nonskid soles. Safety  Remove any tripping hazards, such as rugs, cords, and clutter. Install safety equipment such as grab bars in bathrooms and safety rails on stairs. Keep rooms and walkways well-lit. General instructions Talk with your health care provider about your risks for falling. Tell your health care provider if: You fall. Be sure to tell your health care provider about all falls, even ones that  seem minor. You feel dizzy, tiredness (fatigue), or off-balance. Take over-the-counter and prescription medicines only as told by your health care provider. These include supplements. Eat a healthy diet and maintain a healthy weight. A healthy diet includes low-fat dairy products, low-fat (lean) meats, and fiber from whole grains, beans, and lots of fruits and vegetables. Stay current with your vaccines. Schedule regular health, dental, and eye exams. Summary Having a healthy lifestyle and getting preventive care can help to protect your health and wellness after age 79. Screening and testing are the best way to find a health problem early and help you avoid having a fall. Early diagnosis and treatment give you the best chance for managing medical conditions that are more common for people who are older than age 68. Falls are a major cause of broken bones and head injuries in people who are older than age 92. Take precautions to prevent a fall at home. Work with your health care provider to learn what changes you can make to improve your health and wellness and to prevent falls. This information is not intended to replace advice given to you by your health care provider. Make sure you discuss any questions you have with your health care provider. Document Revised: 05/01/2021 Document Reviewed: 05/01/2021 Elsevier Patient Education  2022 [place], MD [facility]

## 2022-01-01 NOTE — Assessment & Plan Note (Signed)
Stable.  Advised to stay well-hydrated and avoid NSAIDs. 

## 2022-01-01 NOTE — Assessment & Plan Note (Signed)
Stable.  Diet and nutrition discussed. Lab Results  Component Value Date   HGBA1C 5.9 (H) 11/30/2021

## 2022-01-01 NOTE — Assessment & Plan Note (Signed)
Stable with well-controlled rate. Continue carvedilol 12.5 mg twice a day.

## 2022-01-05 ENCOUNTER — Other Ambulatory Visit: Payer: Self-pay

## 2022-01-05 MED ORDER — VALSARTAN 320 MG PO TABS: 320.0000 mg | ORAL_TABLET | Freq: Every day | ORAL | 3 refills | Status: DC

## 2022-04-27 ENCOUNTER — Ambulatory Visit (HOSPITAL_COMMUNITY)
Admission: RE | Admit: 2022-04-27 | Discharge: 2022-04-27 | Disposition: A | Discharge status: Home / Self Care | Payer: Medicare HMO | Source: Ambulatory Visit | Attending: Cardiovascular Disease | Admitting: Cardiovascular Disease

## 2022-04-27 DIAGNOSIS — I6523 Occlusion and stenosis of bilateral carotid arteries: Secondary | ICD-10-CM | POA: Diagnosis not present

## 2022-06-01 DIAGNOSIS — H401111 Primary open-angle glaucoma, right eye, mild stage: Secondary | ICD-10-CM | POA: Diagnosis not present

## 2022-06-01 DIAGNOSIS — H401122 Primary open-angle glaucoma, left eye, moderate stage: Secondary | ICD-10-CM | POA: Diagnosis not present

## 2022-06-11 ENCOUNTER — Other Ambulatory Visit (HOSPITAL_COMMUNITY): Payer: Self-pay | Admitting: Cardiovascular Disease

## 2022-06-11 DIAGNOSIS — H401122 Primary open-angle glaucoma, left eye, moderate stage: Secondary | ICD-10-CM | POA: Diagnosis not present

## 2022-06-11 DIAGNOSIS — I6523 Occlusion and stenosis of bilateral carotid arteries: Secondary | ICD-10-CM

## 2022-06-11 DIAGNOSIS — H401111 Primary open-angle glaucoma, right eye, mild stage: Secondary | ICD-10-CM | POA: Diagnosis not present

## 2022-06-27 ENCOUNTER — Encounter: Payer: Self-pay | Admitting: Emergency Medicine

## 2022-06-27 ENCOUNTER — Ambulatory Visit (INDEPENDENT_AMBULATORY_CARE_PROVIDER_SITE_OTHER): Payer: Medicare HMO | Admitting: Emergency Medicine

## 2022-06-27 VITALS — BP 128/68 | HR 62 | Temp 98.1°F | Ht 67.0 in | Wt 153.5 lb

## 2022-06-27 DIAGNOSIS — J329 Chronic sinusitis, unspecified: Secondary | ICD-10-CM | POA: Diagnosis not present

## 2022-06-27 DIAGNOSIS — I480 Paroxysmal atrial fibrillation: Secondary | ICD-10-CM | POA: Diagnosis not present

## 2022-06-27 DIAGNOSIS — I2510 Atherosclerotic heart disease of native coronary artery without angina pectoris: Secondary | ICD-10-CM | POA: Diagnosis not present

## 2022-06-27 DIAGNOSIS — E785 Hyperlipidemia, unspecified: Secondary | ICD-10-CM | POA: Diagnosis not present

## 2022-06-27 DIAGNOSIS — R7303 Prediabetes: Secondary | ICD-10-CM

## 2022-06-27 DIAGNOSIS — I10 Essential (primary) hypertension: Secondary | ICD-10-CM

## 2022-06-27 DIAGNOSIS — N1831 Chronic kidney disease, stage 3a: Secondary | ICD-10-CM

## 2022-06-27 NOTE — Assessment & Plan Note (Signed)
Well-controlled hypertension. BP Readings from Last 3 Encounters:  06/27/22 128/68  12/27/21 126/78  10/30/21 128/70  Continue amlodipine 10 mg, chlorthalidone 12.5 mg, valsartan 320 mg and carvedilol 12.5 mg twice a day.

## 2022-06-27 NOTE — Patient Instructions (Signed)
Health Maintenance After Age 83 After age 83, you are at a higher risk for certain long-term diseases and infections as well as injuries from falls. Falls are a major cause of broken bones and head injuries in people who are older than age 83. Getting regular preventive care can help to keep you healthy and well. Preventive care includes getting regular testing and making lifestyle changes as recommended by your health care provider. Talk with your health care provider about: Which screenings and tests you should have. A screening is a test that checks for a disease when you have no symptoms. A diet and exercise plan that is right for you. What should I know about screenings and tests to prevent falls? Screening and testing are the best ways to find a health problem early. Early diagnosis and treatment give you the best chance of managing medical conditions that are common after age 83. Certain conditions and lifestyle choices may make you more likely to have a fall. Your health care provider may recommend: Regular vision checks. Poor vision and conditions such as cataracts can make you more likely to have a fall. If you wear glasses, make sure to get your prescription updated if your vision changes. Medicine review. Work with your health care provider to regularly review all of the medicines you are taking, including over-the-counter medicines. Ask your health care provider about any side effects that may make you more likely to have a fall. Tell your health care provider if any medicines that you take make you feel dizzy or sleepy. Strength and balance checks. Your health care provider may recommend certain tests to check your strength and balance while standing, walking, or changing positions. Foot health exam. Foot pain and numbness, as well as not wearing proper footwear, can make you more likely to have a fall. Screenings, including: Osteoporosis screening. Osteoporosis is a condition that causes  the bones to get weaker and break more easily. Blood pressure screening. Blood pressure changes and medicines to control blood pressure can make you feel dizzy. Depression screening. You may be more likely to have a fall if you have a fear of falling, feel depressed, or feel unable to do activities that you used to do. Alcohol use screening. Using too much alcohol can affect your balance and may make you more likely to have a fall. Follow these instructions at home: Lifestyle Do not drink alcohol if: Your health care provider tells you not to drink. If you drink alcohol: Limit how much you have to: 0-1 drink a day for women. 0-2 drinks a day for men. Know how much alcohol is in your drink. In the [place] one drink equals one 12 oz bottle of beer (355 mL), one 5 oz glass of wine (148 mL), or one 1 oz glass of hard liquor (44 mL). Do not use any products that contain nicotine or tobacco. These products include cigarettes, chewing tobacco, and vaping devices, such as e-cigarettes. If you need help quitting, ask your health care provider. Activity  Follow a regular exercise program to stay fit. This will help you maintain your balance. Ask your health care provider what types of exercise are appropriate for you. If you need a cane or walker, use it as recommended by your health care provider. Wear supportive shoes that have nonskid soles. Safety  Remove any tripping hazards, such as rugs, cords, and clutter. Install safety equipment such as grab bars in bathrooms and safety rails on stairs. Keep rooms and walkways   well-lit. General instructions Talk with your health care provider about your risks for falling. Tell your health care provider if: You fall. Be sure to tell your health care provider about all falls, even ones that seem minor. You feel dizzy, tiredness (fatigue), or off-balance. Take over-the-counter and prescription medicines only as told by your health care provider. These include  supplements. Eat a healthy diet and maintain a healthy weight. A healthy diet includes low-fat dairy products, low-fat (lean) meats, and fiber from whole grains, beans, and lots of fruits and vegetables. Stay current with your vaccines. Schedule regular health, dental, and eye exams. Summary Having a healthy lifestyle and getting preventive care can help to protect your health and wellness after age 83. Screening and testing are the best way to find a health problem early and help you avoid having a fall. Early diagnosis and treatment give you the best chance for managing medical conditions that are more common for people who are older than age 83. Falls are a major cause of broken bones and head injuries in people who are older than age 83. Take precautions to prevent a fall at home. Work with your health care provider to learn what changes you can make to improve your health and wellness and to prevent falls. This information is not intended to replace advice given to you by your health care provider. Make sure you discuss any questions you have with your health care provider. Document Revised: 05/01/2021 Document Reviewed: 05/01/2021 Elsevier Patient Education  2023 [employer] Inc[employer]  

## 2022-06-27 NOTE — Assessment & Plan Note (Signed)
Stable without anginal episodes.  Continue daily aspirin 81 mg and carvedilol 12.5 mg twice a day.

## 2022-06-27 NOTE — Assessment & Plan Note (Signed)
Diet and nutrition discussed.  Advised to decrease amount of daily carbohydrate intake.

## 2022-06-27 NOTE — Assessment & Plan Note (Signed)
Advised to stay well-hydrated and avoid NSAIDs. ?

## 2022-06-27 NOTE — Assessment & Plan Note (Signed)
Stable.  Diet and nutrition discussed. Continue atorvastatin 40 mg daily. 

## 2022-06-27 NOTE — Assessment & Plan Note (Signed)
Well-controlled rate.  Asymptomatic.  Stable.  Continue carvedilol 12.5 mg twice a day.

## 2022-06-27 NOTE — Progress Notes (Signed)
Xavier Bell 81 y.o.   Chief Complaint  Patient presents with   Follow-up    6 month f/u appt     HISTORY OF PRESENT ILLNESS: This is a 81 y.o. male here for 58-monthfollow-up of chronic medical problems. Doing well.  Stable. #Chronic sinus issues.  May need ENT referral. May have GERD symptoms mostly at nighttime and related to timing of dinner No other complaints or medical concerns today. Sees cardiologist on a regular basis.  Had blood work done 3 months ago. BP Readings from Last 3 Encounters:  06/27/22 128/68  12/27/21 126/78  10/30/21 128/70   Wt Readings from Last 3 Encounters:  06/27/22 153 lb 8 oz (69.6 kg)  12/27/21 156 lb (70.8 kg)  10/30/21 157 lb (71.2 kg)     HPI   Prior to Admission medications   Medication Sig Start Date End Date Taking? Authorizing Provider  amLODipine (NORVASC) 10 MG tablet Take 1 tablet (10 mg total) by mouth daily. 11/07/21   [name], PWonda Cheng MD  aspirin 81 MG tablet Take 81 mg by mouth daily.    [provider]  atorvastatin (LIPITOR) 40 MG tablet Take 1 tablet (40 mg total) by mouth daily. 11/07/21   [name], PWonda Cheng MD  BRIMONIDINE TARTRATE OP Apply 1 drop to eye 2 (two) times daily.    [provider]  carvedilol (COREG) 12.5 MG tablet Take 1 tablet (12.5 mg total) by mouth 2 (two) times daily. 11/07/21   [name], PWonda Cheng MD  chlorthalidone (HYGROTON) 25 MG tablet Take 0.5 tablets (12.5 mg total) by mouth daily. 11/09/21   [name], PWonda Cheng MD  Cholecalciferol (VITAMIN D) 2000 units tablet Take 2,000 Units by mouth daily.    [provider]  dorzolamide-timolol (COSOPT) 22.3-6.8 MG/ML ophthalmic solution Place 2 drops into both eyes 2 (two) times daily.    [provider]  fenofibrate (TRICOR) 145 MG tablet Take 1 tablet (145 mg total) by mouth daily. 11/10/21   [name], PWonda Cheng MD  latanoprost (XALATAN) 0.005 % ophthalmic solution Place 1 drop into both eyes at bedtime.  07/15/17    [provider]  loratadine (CLARITIN) 10 MG tablet Take 10 mg by mouth daily.    [provider]  Multiple Vitamin (MULTIVITAMIN) tablet Take 1 tablet by mouth daily.    [provider]  Omega-3 Fatty Acids (OMEGA 3 PO) Take 1 capsule by mouth daily.    [provider]  potassium chloride SA (KLOR-CON) 20 MEQ tablet Take 1 tablet (20 mEq total) by mouth daily. 11/09/21 11/04/22  [name], PWonda Cheng MD  Probiotic Product (PROBIOTIC BLEND PO) Take 1 tablet by mouth daily at 12 noon.    [provider]  valsartan (DIOVAN) 320 MG tablet Take 1 tablet (320 mg total) by mouth daily. 01/05/22   [name], PWonda Cheng MD  Zinc 50 MG CAPS Take 50 mg by mouth daily.    [provider]    Allergies  Allergen Reactions   Brimonidine     unknown   Niacin And Related Other (See Comments)    Stomach Pain and flushing     Patient Active Problem List   Diagnosis Date Noted   Prediabetes 01/01/2022   Stage 3a chronic kidney disease ([facility] 01/01/2022   Sciatica of left side 01/18/2018   Mixed hyperlipidemia 12/31/2016   Carotid artery disease ([facility] 09/04/2013   HTN (hypertension) 12/31/2011   Vitamin D deficiency 12/31/2011   Atrial flutter ([facility] 11/09/2011  Coronary artery disease    Hypertension    Dyslipidemia    MI (myocardial infarction) ([place])    Retinal detachment    Pseudoaphakia    Atrial fibrillation [facility])     Past Medical History:  Diagnosis Date   Atrial flutter ([place])    s/p cardioversion 02/08/12; ablation by [name] [phone]   Chronic anticoagulation    Coronary artery disease    s/p remote MI in 1994 with multiple PCI and subsequent CABG x 5 in 1996   Dyslipidemia    Hypertension    MI (myocardial infarction) ([place]) 1995   Normal nuclear stress test 2006   Pseudoaphakia    both eyes   Retinal detachment     Past Surgical History:  Procedure Laterality Date   ATRIAL FLUTTER ABLATION N/A 12/08/2014   CTI by [name]    CARDIAC CATHETERIZATION  1996   CARDIOVERSION  02/08/2012   Procedure: CARDIOVERSION;  Surgeon: Darden Amber., MD;  Location: [facility];  Service: Cardiovascular;  Laterality: N/A;   CORONARY ARTERY BYPASS GRAFT  1996   LIMA to LAD, SVG to RV marginal & PD, SVG  to DX and SVG to Ramus   ORIF ANKLE FRACTURE Left 12/10/2019   Procedure: Open Reduction Internal fixation (ORIF) Left Ankle Fracture;  Surgeon: [name], MD;  Location: [facility];  Service: Orthopedics;  Laterality: Left;   TONSILLECTOMY AND ADENOIDECTOMY      Social History   Socioeconomic History   Marital status: Married    Spouse name: Not on file   Number of children: Not on file   Years of education: Not on file   Highest education level: Not on file  Occupational History   Not on file  Tobacco Use   Smoking status: Never   Smokeless tobacco: Never  Vaping Use   Vaping Use: Never used  Substance and Sexual Activity   Alcohol use: No   Drug use: No   Sexual activity: Not on file  Other Topics Concern   Not on file  Social History Narrative   Not on file   Social Determinants of Health   Financial Resource Strain: Not on file  Food Insecurity: Not on file  Transportation Needs: Not on file  Physical Activity: Not on file  Stress: Not on file  Social Connections: Not on file  Intimate Partner Violence: Not on file    Family History  Problem Relation Age of Onset   Heart disease Mother    Heart disease Father    Coronary artery disease Other      Review of Systems  Constitutional: Negative.  Negative for chills and fever.  HENT: Negative.  Negative for congestion and sore throat.        Chronic sinus congestions and drainage issues  Respiratory: Negative.  Negative for cough and shortness of breath.   Cardiovascular: Negative.  Negative for chest pain and palpitations.  Gastrointestinal:  Positive for heartburn (Occasional heartburn at nighttime). Negative for abdominal pain,  nausea and vomiting.  Genitourinary: Negative.   Skin: Negative.  Negative for rash.  Neurological: Negative.  Negative for dizziness and headaches.  All other systems reviewed and are negative.   Today's Vitals   06/27/22 1302  BP: 128/68  Pulse: 62  Temp: 98.1 F (36.7 C)  TempSrc: Oral  SpO2: 95%  Weight: 153 lb 8 oz (69.6 kg)  Height: '5\' 7"'$  (1.702 m)   Body mass index is 24.04 kg/m.  Physical Exam  Vitals reviewed.  Constitutional:      Appearance: Normal appearance.  HENT:     Head: Normocephalic.  Eyes:     Extraocular Movements: Extraocular movements intact.  Pulmonary:     Effort: Pulmonary effort is normal.  Skin:    General: Skin is warm and dry.     Capillary Refill: Capillary refill takes less than 2 seconds.  Neurological:     General: No focal deficit present.     Mental Status: He is alert and oriented to person, place, and time.  Psychiatric:        Mood and Affect: Mood normal.        Behavior: Behavior normal.      ASSESSMENT & PLAN: A total of 47 minutes was spent with the patient and counseling/coordination of care regarding preparing for this visit, review of most recent office visit notes, review of multiple chronic medical problems and their management, review of all medications, review of most recent blood work results, education on nutrition, prognosis, documentation and need for follow-up.  Problem List Items Addressed This Visit       Cardiovascular and Mediastinum   Coronary artery disease    Stable without anginal episodes.  Continue daily aspirin 81 mg and carvedilol 12.5 mg twice a day.      Hypertension - Primary    Well-controlled hypertension. BP Readings from Last 3 Encounters:  06/27/22 128/68  12/27/21 126/78  10/30/21 128/70  Continue amlodipine 10 mg, chlorthalidone 12.5 mg, valsartan 320 mg and carvedilol 12.5 mg twice a day.       Atrial fibrillation ([place])    Well-controlled rate.  Asymptomatic.  Stable.   Continue carvedilol 12.5 mg twice a day.        Genitourinary   Stage 3a chronic kidney disease ([place])    Advised to stay well-hydrated and avoid NSAIDs.        Other   Dyslipidemia    Stable.  Diet and nutrition discussed.  Continue atorvastatin 40 mg daily.      Prediabetes    Diet and nutrition discussed.  Advised to decrease amount of daily carbohydrate intake.      Other Visit Diagnoses     Chronic congestion of paranasal sinus       Relevant Orders   Ambulatory referral to ENT      Patient Instructions  Health Maintenance After Age 59 After age 79, you are at a higher risk for certain long-term diseases and infections as well as injuries from falls. Falls are a major cause of broken bones and head injuries in people who are older than age 66. Getting regular preventive care can help to keep you healthy and well. Preventive care includes getting regular testing and making lifestyle changes as recommended by your health care provider. Talk with your health care provider about: Which screenings and tests you should have. A screening is a test that checks for a disease when you have no symptoms. A diet and exercise plan that is right for you. What should I know about screenings and tests to prevent falls? Screening and testing are the best ways to find a health problem early. Early diagnosis and treatment give you the best chance of managing medical conditions that are common after age 19. Certain conditions and lifestyle choices may make you more likely to have a fall. Your health care provider may recommend: Regular vision checks. Poor vision and conditions such as cataracts can make you more likely to have  a fall. If you wear glasses, make sure to get your prescription updated if your vision changes. Medicine review. Work with your health care provider to regularly review all of the medicines you are taking, including over-the-counter medicines. Ask your health care provider  about any side effects that may make you more likely to have a fall. Tell your health care provider if any medicines that you take make you feel dizzy or sleepy. Strength and balance checks. Your health care provider may recommend certain tests to check your strength and balance while standing, walking, or changing positions. Foot health exam. Foot pain and numbness, as well as not wearing proper footwear, can make you more likely to have a fall. Screenings, including: Osteoporosis screening. Osteoporosis is a condition that causes the bones to get weaker and break more easily. Blood pressure screening. Blood pressure changes and medicines to control blood pressure can make you feel dizzy. Depression screening. You may be more likely to have a fall if you have a fear of falling, feel depressed, or feel unable to do activities that you used to do. Alcohol use screening. Using too much alcohol can affect your balance and may make you more likely to have a fall. Follow these instructions at home: Lifestyle Do not drink alcohol if: Your health care provider tells you not to drink. If you drink alcohol: Limit how much you have to: 0-1 drink a day for women. 0-2 drinks a day for men. Know how much alcohol is in your drink. In the [place] one drink equals one 12 oz bottle of beer (355 mL), one 5 oz glass of wine (148 mL), or one 1 oz glass of hard liquor (44 mL). Do not use any products that contain nicotine or tobacco. These products include cigarettes, chewing tobacco, and vaping devices, such as e-cigarettes. If you need help quitting, ask your health care provider. Activity  Follow a regular exercise program to stay fit. This will help you maintain your balance. Ask your health care provider what types of exercise are appropriate for you. If you need a cane or walker, use it as recommended by your health care provider. Wear supportive shoes that have nonskid soles. Safety  Remove any tripping  hazards, such as rugs, cords, and clutter. Install safety equipment such as grab bars in bathrooms and safety rails on stairs. Keep rooms and walkways well-lit. General instructions Talk with your health care provider about your risks for falling. Tell your health care provider if: You fall. Be sure to tell your health care provider about all falls, even ones that seem minor. You feel dizzy, tiredness (fatigue), or off-balance. Take over-the-counter and prescription medicines only as told by your health care provider. These include supplements. Eat a healthy diet and maintain a healthy weight. A healthy diet includes low-fat dairy products, low-fat (lean) meats, and fiber from whole grains, beans, and lots of fruits and vegetables. Stay current with your vaccines. Schedule regular health, dental, and eye exams. Summary Having a healthy lifestyle and getting preventive care can help to protect your health and wellness after age 26. Screening and testing are the best way to find a health problem early and help you avoid having a fall. Early diagnosis and treatment give you the best chance for managing medical conditions that are more common for people who are older than age 56. Falls are a major cause of broken bones and head injuries in people who are older than age 76. Take precautions to prevent  a fall at home. Work with your health care provider to learn what changes you can make to improve your health and wellness and to prevent falls. This information is not intended to replace advice given to you by your health care provider. Make sure you discuss any questions you have with your health care provider. Document Revised: 05/01/2021 Document Reviewed: 05/01/2021 Elsevier Patient Education  [place], MD [facility]

## 2022-09-24 ENCOUNTER — Other Ambulatory Visit: Payer: Self-pay

## 2022-09-24 MED ORDER — AMLODIPINE BESYLATE 10 MG PO TABS: 10.0000 mg | ORAL_TABLET | Freq: Every day | ORAL | 0 refills | Status: DC

## 2022-09-24 MED ORDER — ATORVASTATIN CALCIUM 40 MG PO TABS: 40.0000 mg | ORAL_TABLET | Freq: Every day | ORAL | 0 refills | Status: DC

## 2022-09-24 NOTE — Telephone Encounter (Signed)
Pt's medication was sent to pt's pharmacy as requested. Confirmation received.  °

## 2022-10-08 ENCOUNTER — Other Ambulatory Visit: Payer: Self-pay

## 2022-10-08 MED ORDER — POTASSIUM CHLORIDE CRYS ER 20 MEQ PO TBCR: 20.0000 meq | EXTENDED_RELEASE_TABLET | Freq: Every day | ORAL | 0 refills | Status: DC

## 2022-10-28 ENCOUNTER — Encounter: Payer: Self-pay | Admitting: Cardiovascular Disease

## 2022-10-28 NOTE — Progress Notes (Unsigned)
Cardiology Office Note   Date:  10/29/2022   ID:  Xavier Bell, Xavier Bell 1941-02-17, MRN [id number]  PCP:  Xavier Pollen, MD  Cardiologist:   Xavier Moores, MD   Chief Complaint  Patient presents with   Coronary Artery Disease        Hypertension        1. Coronary artery disease-status post CABG 2. Atrial flutter- s/p cardioversion 02/08/12 3. Hypertension 4. Right carotid bruit     Has a history of atrial flutter this past spring. He status post cardioversion. [name] done very well. He continues to take Xarelto.  [name] not been exercising nearly as much as he needs to. [name] also been careless with his diet and has picked up a few pounds.  March 06, 2013:  He is doing well.  No angina.  He is continuing to have foot problems - especially of the left foot.  He has intense pain in his heel when he walks.  Sept. 12, 2014:  He has done well.  Still not exercising as much.  Takes motrin at night.  He has left plantar faciatis.    March 19, 2014:  [name] is doing ok No CP or dyspnea.  No palpitations. He has gone back into atrial flutter.   He cannot tell that his HR is irreg.   He has a hx of a-flutter in the past ( 2014) and was treated with Xarelto and was cardioverted.   May 09, 2015 Xavier Bell is a 81 y.o. male who presents for follow up of his atrial flutter , HTN, CAD No CP , not exercising much .   Nov. 14, 2016: Doing well. BP is a bit high. Not much exercise ,  Sore feet.   May 28, 2016:  Doing well.  BP is normal  No CP , no dyspnea at rest.   Slight DOE with activity. Not exercising   Jan. 8, 2018:  [name] is seen today .   Doing well.   Not exercising much .   Knows that he needs to.  Aug. 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  No CP , no dyspnea   April 21, 2018:  [name] is seen back today for follow-up of his coronary artery disease and mild to moderate carotid artery disease.  Recent carotid duplex scan reveals moderate  left internal carotid plaque and mild right internal carotid disease.  Hx of hyperlipidemia ,    Had some back issues  - has 3 bulging discs, 1 was herniated  Back has improved some with conservative management .   Does not think he will need surgery any time soon.   No CP or dyspnea   November 03, 2018: She is seen today for follow-up of his coronary artery disease and moderate carotid artery disease.  Is a moderate left internal carotid plaque.  He has mild right internal carotid disease.  Is planning on selling his [name]  No CP or dyspnea Bp is a bit elevated  Has been eating more salt than he should   Aug. 19, 2020 :  [name] is seen today for follow up visit Has a hx of atrial flutter - is s/p ablation  .   - is in NSR  Today  BP is a bit elevated.  Has gained a few lbs.   February 15, 2020:  [name] is seen today for follow-up visit.  He has a history of coronary artery disease and is  status post coronary artery bypass grafting.  He has moderate carotid artery disease.  Said a history of atrial flutter and is status post ablation. Fell through his ceiling from the attic.   Broke his left ankle ,  Had surgical repair of his ankle  Otherwise doing ok No covid vaccine yet.     Aug. 23, 2021:  [name] is seen today for follow up of his CAD .  BP has been elevated.  He bought a BP cuff. And gets variable readings.  He does not trust the cuff.  I reviewed his recordings   Admits that his diet is not what is should be.  Still eating chips,  Cheese  No angina . No dyspnea   Carotid study is unchanged.   Labs from 3 days ago are stable   Feb. 16, 2022: [name] is seen today for follow up of his CAD  HTN HLD Is exercising some .   Trying to lose some weight . 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   BP log shows BP typically in the 140-150 range.   Nov. 7, 2022 [name] is seen today for follow up  of his CAD , HTN, HLD  Has had some HTN - especially at a time when he was eating lots of salt .  ( Wife was afway so he was eating lots of chips / salsa Diet has improved.  Records his sodium intake on legal pad.  Typically eats between 1000 - 1500 mg of sodium .  BP now is great.  Gave the ok to have oyster stew at Christmas.     Nov. 6, 2023  [name] is seen today for follow up of his CAD, HTN, HLD  No CP, Has chronic dyspnea .   Exercises regularly ,  Walks 200 - 250 minutes a week  Has had Aflutter ablation , is only on ASA 81 mg now       Past Medical History:  Diagnosis Date   Atrial flutter ([place])    s/p cardioversion 02/08/12; ablation by Dr Xavier Bell [phone]   Chronic anticoagulation    Coronary artery disease    s/p remote MI in 1994 with multiple PCI and subsequent CABG x 5 in 1996   Dyslipidemia    Hypertension    MI (myocardial infarction) ([place]) 1995   Normal nuclear stress test 2006   Pseudoaphakia    both eyes   Retinal detachment     Past Surgical History:  Procedure Laterality Date   ATRIAL FLUTTER ABLATION N/A 12/08/2014   CTI by Dr Xavier Bell   CARDIAC CATHETERIZATION  1996   CARDIOVERSION  02/08/2012   Procedure: CARDIOVERSION;  Surgeon: Xavier Bell., MD;  Location: [facility];  Service: Cardiovascular;  Laterality: N/A;   CORONARY ARTERY BYPASS GRAFT  1996   LIMA to LAD, SVG to RV marginal & PD, SVG  to DX and SVG to Ramus   ORIF ANKLE FRACTURE Left 12/10/2019   Procedure: Open Reduction Internal fixation (ORIF) Left Ankle Fracture;  Surgeon: Xavier Simmer, MD;  Location: [facility];  Service: Orthopedics;  Laterality: Left;   TONSILLECTOMY AND ADENOIDECTOMY       Current Outpatient Medications  Medication Sig Dispense Refill   amLODipine (NORVASC) 10 MG tablet Take 1 tablet (10 mg total) by mouth daily. 90 tablet 0   Ascorbic Acid (VITAMIN C) 1000 MG tablet Take 1,000 mg by mouth  daily.     aspirin 81 MG tablet Take 81 mg by mouth  daily.     atorvastatin (LIPITOR) 40 MG tablet Take 1 tablet (40 mg total) by mouth daily. 90 tablet 0   BRIMONIDINE TARTRATE OP Apply 1 drop to eye 2 (two) times daily.     carvedilol (COREG) 12.5 MG tablet Take 1 tablet (12.5 mg total) by mouth 2 (two) times daily. 180 tablet 3   chlorthalidone (HYGROTON) 25 MG tablet Take 0.5 tablets (12.5 mg total) by mouth daily. 45 tablet 3   Cholecalciferol (VITAMIN D) 2000 units tablet Take 2,000 Units by mouth daily.     dorzolamide-timolol (COSOPT) 22.3-6.8 MG/ML ophthalmic solution Place 2 drops into both eyes 2 (two) times daily.     fenofibrate (TRICOR) 145 MG tablet Take 1 tablet (145 mg total) by mouth daily. 90 tablet 3   latanoprost (XALATAN) 0.005 % ophthalmic solution Place 1 drop into both eyes at bedtime.      loratadine (CLARITIN) 10 MG tablet Take 10 mg by mouth daily.     Multiple Vitamin (MULTIVITAMIN) tablet Take 1 tablet by mouth daily.     Omega-3 Fatty Acids (OMEGA 3 PO) Take 1 capsule by mouth daily.     potassium chloride SA (KLOR-CON M) 20 MEQ tablet Take 1 tablet (20 mEq total) by mouth daily. 90 tablet 0   Probiotic Product (PROBIOTIC BLEND PO) Take 1 tablet by mouth daily at 12 noon.     valsartan (DIOVAN) 320 MG tablet Take 1 tablet (320 mg total) by mouth daily. 90 tablet 3   Zinc 50 MG CAPS Take 50 mg by mouth daily.     No current facility-administered medications for this visit.    Allergies:   Niacin and related    Social History:  The patient  reports that he has never smoked. He has never used smokeless tobacco. He reports that he does not drink alcohol and does not use drugs.   Family History:  The patient's family history includes Coronary artery disease in an other family member; Heart disease in his father and mother.    ROS:  Noted in current hx , otherwise , ROS is negative    Physical Exam: Blood pressure 136/70, pulse 60, height '5\' 7"'$  (1.702 m), weight 153 lb 3.2 oz (69.5 kg), SpO2 96 %.        GEN:  Well nourished, well developed in no acute distress HEENT: Normal NECK: No JVD; No carotid bruits LYMPHATICS: No lymphadenopathy CARDIAC: RRR , no murmurs, rubs, gallops RESPIRATORY:  Clear to auscultation without rales, wheezing or rhonchi  ABDOMEN: Soft, non-tender, non-distended MUSCULOSKELETAL:  No edema; No deformity  SKIN: Warm and dry NEUROLOGIC:  Alert and oriented x 3   EKG:    .Nov. 6, 2023:   NSR at 60.  1st degree AV block  occasional PVCs  NS IVCD  No changes from previous ecg  Still eating chips  ( fritos, )    Recent Labs: 10/30/2021: ALT 24 11/30/2021: BUN 36; Creatinine, Ser 1.28; Potassium 4.6; Sodium 140    Lipid Panel    Component Value Date/Time   CHOL 107 10/30/2021 1043   TRIG 124 10/30/2021 1043   HDL 29 (L) 10/30/2021 1043   CHOLHDL 3.7 10/30/2021 1043   CHOLHDL 2.7 05/28/2016 0816   VLDL 16 05/28/2016 0816   LDLCALC 56 10/30/2021 1043      Wt Readings from Last 3 Encounters:  10/29/22 153 lb 3.2 oz (69.5  kg)  06/27/22 153 lb 8 oz (69.6 kg)  12/27/21 156 lb (70.8 kg)      Other studies Reviewed: Additional studies/ records that were reviewed today include: . Review of the above records demonstrates:    ASSESSMENT AND PLAN:  1. Coronary artery disease-      no angina ,  continue   2. 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     4. Right carotid bruit-        5. Hyperlipidemia:    -    lipids have been well controlled.   Lipids and ALT today     Current medicines are reviewed at length with the patient today.  The patient does not have concerns regarding medicines.     Labs/ tests ordered today include:   No orders of the defined types were placed in this encounter.     Disposition:          Xavier Moores, MD  10/29/2022 10:33 AM    [facility] [address], [place], [place]   [address] Phone: [phone]; Fax: [phone]

## 2022-10-29 ENCOUNTER — Encounter: Payer: Self-pay | Admitting: Cardiovascular Disease

## 2022-10-29 ENCOUNTER — Ambulatory Visit: Payer: Medicare HMO | Attending: Cardiovascular Disease | Admitting: Cardiovascular Disease

## 2022-10-29 VITALS — BP 136/70 | HR 60 | Ht 67.0 in | Wt 153.2 lb

## 2022-10-29 DIAGNOSIS — H401122 Primary open-angle glaucoma, left eye, moderate stage: Secondary | ICD-10-CM | POA: Diagnosis not present

## 2022-10-29 DIAGNOSIS — H401111 Primary open-angle glaucoma, right eye, mild stage: Secondary | ICD-10-CM | POA: Diagnosis not present

## 2022-10-29 DIAGNOSIS — E782 Mixed hyperlipidemia: Secondary | ICD-10-CM

## 2022-10-29 DIAGNOSIS — Z961 Presence of intraocular lens: Secondary | ICD-10-CM | POA: Diagnosis not present

## 2022-10-29 DIAGNOSIS — Z8669 Personal history of other diseases of the nervous system and sense organs: Secondary | ICD-10-CM | POA: Diagnosis not present

## 2022-10-29 DIAGNOSIS — I2510 Atherosclerotic heart disease of native coronary artery without angina pectoris: Secondary | ICD-10-CM | POA: Diagnosis not present

## 2022-10-29 LAB — BASIC METABOLIC PANEL
BUN/Creatinine Ratio: 25 — ABNORMAL HIGH (ref 10–24)
BUN: 28 mg/dL — ABNORMAL HIGH (ref 8–27)
CO2: 24 mmol/L (ref 20–29)
Calcium: 10.1 mg/dL (ref 8.6–10.2)
Chloride: 106 mmol/L (ref 96–106)
Creatinine, Ser: 1.12 mg/dL (ref 0.76–1.27)
Glucose: 104 mg/dL — ABNORMAL HIGH (ref 70–99)
Potassium: 4.3 mmol/L (ref 3.5–5.2)
Sodium: 142 mmol/L (ref 134–144)
eGFR: 65 mL/min/{1.73_m2} (ref >59)

## 2022-10-29 LAB — LIPID PANEL
Chol/HDL Ratio: 3.1 ratio (ref 0.0–5.0)
Cholesterol, Total: 118 mg/dL (ref 100–199)
HDL: 38 mg/dL — ABNORMAL LOW (ref >39)
LDL Chol Calc (NIH): 61 mg/dL (ref 0–99)
Triglycerides: 101 mg/dL (ref 0–149)
VLDL Cholesterol Cal: 19 mg/dL (ref 5–40)

## 2022-10-29 LAB — ALT: ALT: 28 IU/L (ref 0–44)

## 2022-10-29 NOTE — Patient Instructions (Signed)
Medication Instructions:  Your physician recommends that you continue on your current medications as directed. Please refer to the Current Medication list given to you today.  *If you need a refill on your cardiac medications before your next appointment, please call your pharmacy*   Lab Work: Lipids, ALT, BMET today If you have labs (blood work) drawn today and your tests are completely normal, you will receive your results only by: [place] (if you have MyChart) OR A paper copy in the mail If you have any lab test that is abnormal or we need to change your treatment, we will call you to review the results.   Testing/Procedures: NONE   Follow-Up: At [facility], you and your health needs are our priority.  As part of our continuing mission to provide you with exceptional heart care, we have created designated Provider Care Teams.  These Care Teams include your primary Cardiologist (physician) and Advanced Practice Providers (APPs -  Physician Assistants and Nurse Practitioners) who all work together to provide you with the care you need, when you need it.  Your next appointment:   1 year(s)  The format for your next appointment:   In Person  Provider:   Mertie Moores, MD       Important Information About Sugar

## 2022-11-12 DIAGNOSIS — H401111 Primary open-angle glaucoma, right eye, mild stage: Secondary | ICD-10-CM | POA: Diagnosis not present

## 2022-11-12 DIAGNOSIS — H401122 Primary open-angle glaucoma, left eye, moderate stage: Secondary | ICD-10-CM | POA: Diagnosis not present

## 2022-11-13 ENCOUNTER — Other Ambulatory Visit: Payer: Self-pay | Admitting: Cardiovascular Disease

## 2022-11-13 ENCOUNTER — Other Ambulatory Visit: Payer: Self-pay

## 2022-11-13 MED ORDER — VALSARTAN 320 MG PO TABS: 320.0000 mg | ORAL_TABLET | Freq: Every day | ORAL | 3 refills | Status: DC

## 2022-11-13 NOTE — Telephone Encounter (Signed)
Pt's medication was sent to pt's pharmacy as requested. Confirmation received.  °

## 2022-12-09 ENCOUNTER — Other Ambulatory Visit: Payer: Self-pay | Admitting: Cardiovascular Disease

## 2022-12-10 ENCOUNTER — Other Ambulatory Visit: Payer: Self-pay | Admitting: Cardiovascular Disease

## 2022-12-10 NOTE — Telephone Encounter (Signed)
Rx refill sent to pharmacy. 

## 2022-12-12 ENCOUNTER — Other Ambulatory Visit: Payer: Self-pay | Admitting: Cardiovascular Disease

## 2022-12-25 ENCOUNTER — Ambulatory Visit (INDEPENDENT_AMBULATORY_CARE_PROVIDER_SITE_OTHER): Payer: Medicare HMO

## 2022-12-25 VITALS — Ht 67.0 in | Wt 153.2 lb

## 2022-12-25 DIAGNOSIS — Z0000 Encounter for general adult medical examination without abnormal findings: Secondary | ICD-10-CM

## 2022-12-25 NOTE — Progress Notes (Signed)
Virtual Visit via Telephone Note  I connected with  Xavier Bell on 12/25/22 at  3:30 PM EST by telephone and verified that I am speaking with the correct person using two identifiers.  Location: Patient: Home Provider: [place] Persons participating in the virtual visit: [place]   I discussed the limitations, risks, security and privacy concerns of performing an evaluation and management service by telephone and the availability of in person appointments. The patient expressed understanding and agreed to proceed.  Interactive audio and video telecommunications were attempted between this nurse and patient, however failed, due to patient having technical difficulties OR patient did not have access to video capability.  We continued and completed visit with audio only.  Some vital signs may be absent or patient reported.   Sheral Flow, LPN  Subjective:   Xavier Bell is a 82 y.o. male who presents for an Initial Medicare Annual Wellness Visit.  Review of Systems     Cardiac Risk Factors include: advanced age (>72mn, >>2women);hypertension;family history of premature cardiovascular disease;dyslipidemia;male gender     Objective:    Today's Vitals   12/25/22 1534  Weight: 153 lb 3.2 oz (69.5 kg)  Height: '5\' 7"'$  (1.702 m)  PainSc: 0-No pain   Body mass index is 23.99 kg/m.     12/25/2022    3:43 PM 12/09/2019    3:34 PM 12/08/2014    7:30 AM  Advanced Directives  Does Patient Have a Medical Advance Directive? Yes No No  Type of AParamedicof AByersLiving will    Copy of HWilsonin Chart? No - copy requested    Would patient like information on creating a medical advance directive?  No - Patient declined No - patient declined information    Current Medications (verified) Outpatient Encounter Medications as of 12/25/2022  Medication Sig   amLODipine (NORVASC) 10 MG tablet TAKE  1 TABLET EVERY DAY (NEED MD APPOINTMENT)   Ascorbic Acid (VITAMIN C) 1000 MG tablet Take 1,000 mg by mouth daily.   aspirin 81 MG tablet Take 81 mg by mouth daily.   atorvastatin (LIPITOR) 40 MG tablet TAKE 1 TABLET EVERY DAY   BRIMONIDINE TARTRATE OP Apply 1 drop to eye 2 (two) times daily.   carvedilol (COREG) 12.5 MG tablet TAKE 1 TABLET TWICE DAILY   chlorthalidone (HYGROTON) 25 MG tablet TAKE 1/2 TABLET EVERY DAY   Cholecalciferol (VITAMIN D) 2000 units tablet Take 2,000 Units by mouth daily.   dorzolamide-timolol (COSOPT) 22.3-6.8 MG/ML ophthalmic solution Place 2 drops into both eyes 2 (two) times daily.   fenofibrate (TRICOR) 145 MG tablet TAKE 1 TABLET EVERY DAY   latanoprost (XALATAN) 0.005 % ophthalmic solution Place 1 drop into both eyes at bedtime.    loratadine (CLARITIN) 10 MG tablet Take 10 mg by mouth daily.   Multiple Vitamin (MULTIVITAMIN) tablet Take 1 tablet by mouth daily.   Omega-3 Fatty Acids (OMEGA 3 PO) Take 1 capsule by mouth daily.   potassium chloride SA (KLOR-CON M) 20 MEQ tablet Take 1 tablet (20 mEq total) by mouth daily.   Probiotic Product (PROBIOTIC BLEND PO) Take 1 tablet by mouth daily at 12 noon.   valsartan (DIOVAN) 320 MG tablet Take 1 tablet (320 mg total) by mouth daily.   Zinc 50 MG CAPS Take 50 mg by mouth daily.   No facility-administered encounter medications on file as of 12/25/2022.    Allergies (verified) Niacin and related  History: Past Medical History:  Diagnosis Date   Atrial flutter ([place])    s/p cardioversion 02/08/12; ablation by [name] [phone]   Chronic anticoagulation    Coronary artery disease    s/p remote MI in 1994 with multiple PCI and subsequent CABG x 5 in 1996   Dyslipidemia    Hypertension    MI (myocardial infarction) ([place]) 1995   Normal nuclear stress test 2006   Pseudoaphakia    both eyes   Retinal detachment    Past Surgical History:  Procedure Laterality Date   ATRIAL FLUTTER ABLATION N/A 12/08/2014    CTI by [name]   CARDIAC CATHETERIZATION  1996   CARDIOVERSION  02/08/2012   Procedure: CARDIOVERSION;  Surgeon: Darden Amber., MD;  Location: [facility];  Service: Cardiovascular;  Laterality: N/A;   CORONARY ARTERY BYPASS GRAFT  1996   LIMA to LAD, SVG to RV marginal & PD, SVG  to DX and SVG to [place]   ORIF ANKLE FRACTURE Left 12/10/2019   Procedure: Open Reduction Internal fixation (ORIF) Left Ankle Fracture;  Surgeon: [name], MD;  Location: [facility];  Service: Orthopedics;  Laterality: Left;   TONSILLECTOMY AND ADENOIDECTOMY     Family History  Problem Relation Age of Onset   Heart disease Mother    Heart disease Father    Coronary artery disease Other    Social History   Socioeconomic History   Marital status: Married    Spouse name: Not on file   Number of children: Not on file   Years of education: Not on file   Highest education level: Not on file  Occupational History   Not on file  Tobacco Use   Smoking status: Never   Smokeless tobacco: Never  Vaping Use   Vaping Use: Never used  Substance and Sexual Activity   Alcohol use: No   Drug use: No   Sexual activity: Not on file  Other Topics Concern   Not on file  Social History Narrative   Not on file   Social Determinants of Health   Financial Resource Strain: Low Risk  (12/25/2022)   Overall Financial Resource Strain (CARDIA)    Difficulty of Paying Living Expenses: Not hard at all  Food Insecurity: No Food Insecurity (12/25/2022)   Hunger Vital Sign    Worried About Running Out of Food in the Last Year: Never true    [place] in the Last Year: Never true  Transportation Needs: No Transportation Needs (12/25/2022)   PRAPARE - Hydrologist (Medical): No    Lack of Transportation (Non-Medical): No  Physical Activity: Sufficiently Active (12/25/2022)   Exercise Vital Sign    Days of Exercise per Week: 6 days    Minutes of Exercise per Session: 150+ min   Stress: No Stress Concern Present (12/25/2022)   [place]    Feeling of Stress : Not at all  Social Connections: [place] (12/25/2022)   Social Connection and Isolation Panel [NHANES]    Frequency of Communication with Friends and Family: More than three times a week    Frequency of Social Gatherings with Friends and Family: More than three times a week    Attends Religious Services: More than 4 times per year    Active Member of [employer] or Organizations: Yes    Attends Archivist Meetings: More than 4 times per year  Marital Status: Married    Tobacco Counseling Counseling given: Not Answered   Clinical Intake:  Pre-visit preparation completed: Yes  Pain : No/denies pain Pain Score: 0-No pain     BMI - recorded: 23.99 Nutritional Status: BMI of 19-24  Normal Nutritional Risks: None Diabetes: No  How often do you need to have someone help you when you read instructions, pamphlets, or other written materials from your doctor or pharmacy?: 1 - Never What is the last grade level you completed in school?: HSG; B.S in Marketing from [place].  Diabetic? no  Interpreter Needed?: No  Information entered by :: Lisette Abu, LPN.   Activities of Daily Living    12/25/2022    3:52 PM  In your present state of health, do you have any difficulty performing the following activities:  Hearing? 0  Vision? 0  Difficulty concentrating or making decisions? 0  Walking or climbing stairs? 0  Dressing or bathing? 0  Doing errands, shopping? 0  Preparing Food and eating ? N  Using the Toilet? N  In the past six months, have you accidently leaked urine? N  Do you have problems with loss of bowel control? N  Managing your Medications? N  Managing your Finances? N  Housekeeping or managing your Housekeeping? N    Patient Care Team: Horald Pollen, MD as PCP - General (Internal  Medicine) [name], [name], MD as PCP - Cardiology (Cardiology) Bond, Tracie Harrier, MD as Referring Physician (Ophthalmology) Drake Leach, [place] as Consulting Physician (Optometry)  Indicate any recent Medical Services you may have received from other than [facility] providers in the past year (date may be approximate).     Assessment:   This is a routine wellness examination for [name].  Hearing/Vision screen Hearing Screening - Comments:: Denies hearing difficulties   Vision Screening - Comments:: Wears rx glasses - up to date with routine eye exams with Dr. Raynelle Fanning   Dietary issues and exercise activities discussed: Current Exercise Habits: Home exercise routine, Type of exercise: treadmill;walking, Time (Minutes): 60, Frequency (Times/Week): 7, Weekly Exercise (Minutes/Week): 420, Intensity: Moderate, Exercise limited by: cardiac condition(s)   Goals Addressed             This Visit's Progress    To maintain my current health status by continuing to eat healthy, stay physically active and socially active.        Depression Screen    12/25/2022    3:48 PM 12/27/2021    1:21 PM 01/18/2018    1:41 PM  PHQ 2/9 Scores  PHQ - 2 Score 0 0 0    Fall Risk    12/25/2022    3:45 PM 12/27/2021    1:21 PM 01/18/2018    1:41 PM  [place] in the past year? 0 0 No  Number falls in past yr: 0 0   Injury with Fall? 0 0   Risk for fall due to : No Fall Risks    Follow up Falls prevention discussed      [place]:  Any stairs in or around the home? Yes  If so, are there any without handrails? No  Home free of loose throw rugs in walkways, pet beds, electrical cords, etc? Yes  Adequate lighting in your home to reduce risk of falls? Yes   ASSISTIVE DEVICES UTILIZED TO PREVENT FALLS:  Life alert? No  Use of a cane, walker or w/c? No  Grab bars in the bathroom? No  Shower chair or bench in shower? No  Elevated toilet seat or a handicapped  toilet? No   TIMED UP AND GO:  Was the test performed? No . Phone Visit  Cognitive Function:        12/25/2022    3:46 PM  6CIT Screen  What Year? 0 points  What month? 0 points  What time? 0 points  Count back from 20 0 points  Months in reverse 0 points  Repeat phrase 0 points  Total Score 0 points    Immunizations Immunization History  Administered Date(s) Administered   Influenza Inj Mdck Quad Pf 09/15/2017   Influenza Inj Mdck Quad With Preservative 10/11/2019   Influenza,inj,Quad PF,6+ Mos 10/05/2018   Influenza,inj,quad, With Preservative 08/24/2017   Influenza-Unspecified 08/24/2017, 10/07/2022   PFIZER(Purple Top)SARS-COV-2 Vaccination 03/06/2020, 03/27/2020, 10/06/2020, 07/07/2021, 10/15/2022   Pneumococcal Conjugate-13 12/25/2003    TDAP status: Due, Education has been provided regarding the importance of this vaccine. Advised may receive this vaccine at local pharmacy or Health Dept. Aware to provide a copy of the vaccination record if obtained from local pharmacy or Health Dept. Verbalized acceptance and understanding.  Flu Vaccine status: Due, Education has been provided regarding the importance of this vaccine. Advised may receive this vaccine at local pharmacy or Health Dept. Aware to provide a copy of the vaccination record if obtained from local pharmacy or Health Dept. Verbalized acceptance and understanding.  Pneumococcal vaccine status: Declined,  Education has been provided regarding the importance of this vaccine but patient still declined. Advised may receive this vaccine at local pharmacy or Health Dept. Aware to provide a copy of the vaccination record if obtained from local pharmacy or Health Dept. Verbalized acceptance and understanding.   Covid-19 vaccine status: Completed vaccines  Qualifies for Shingles Vaccine? Yes   Zostavax completed No   Shingrix Completed?: No.    Education has been provided regarding the importance of this vaccine.  Patient has been advised to call insurance company to determine out of pocket expense if they have not yet received this vaccine. Advised may also receive vaccine at local pharmacy or Health Dept. Verbalized acceptance and understanding.  Screening Tests Health Maintenance  Topic Date Due   DTaP/Tdap/Td (1 - Tdap) Never done   Zoster Vaccines- Shingrix (1 of 2) Never done   Pneumonia Vaccine 1+ Years old (2 - PPSV23 or PCV20) 12/24/2004   COVID-19 Vaccine (6 - 2023-24 season) 12/10/2022   Medicare Annual Wellness (AWV)  12/26/2023   INFLUENZA VACCINE  Completed   HPV VACCINES  Aged Out    Health Maintenance  Health Maintenance Due  Topic Date Due   DTaP/Tdap/Td (1 - Tdap) Never done   Zoster Vaccines- Shingrix (1 of 2) Never done   Pneumonia Vaccine 56+ Years old (2 - PPSV23 or PCV20) 12/24/2004   COVID-19 Vaccine (6 - 2023-24 season) 12/10/2022    Colorectal cancer screening: No longer required.   Lung Cancer Screening: (Low Dose CT Chest recommended if Age 70-80 years, 30 pack-year currently smoking OR have quit w/in 15years.) does not qualify.   Lung Cancer Screening Referral: no  Additional Screening:  Hepatitis C Screening: does not qualify; Completed no  Vision Screening: Recommended annual ophthalmology exams for early detection of glaucoma and other disorders of the eye. Is the patient up to date with their annual eye exam?  Yes  Who is the provider or what is the name of the office in which the  patient attends annual eye exams? Raynelle Fanning, MD. If pt is not established with a provider, would they like to be referred to a provider to establish care? No .   Dental Screening: Recommended annual dental exams for proper oral hygiene  Community Resource Referral / Chronic Care Management: CRR required this visit?  No   CCM required this visit?  No      Plan:     I have personally reviewed and noted the following in the patient's chart:   Medical and social  history Use of alcohol, tobacco or illicit drugs  Current medications and supplements including opioid prescriptions. Patient is not currently taking opioid prescriptions. Functional ability and status Nutritional status Physical activity Advanced directives List of other physicians Hospitalizations, surgeries, and ER visits in previous 12 months Vitals Screenings to include cognitive, depression, and falls Referrals and appointments  In addition, I have reviewed and discussed with patient certain preventive protocols, quality metrics, and best practice recommendations. A written personalized care plan for preventive services as well as general preventive health recommendations were provided to patient.     Sheral Flow, LPN   05/26/1307   Nurse Notes: N/A

## 2022-12-25 NOTE — Patient Instructions (Signed)
[name] , Thank you for taking time to come for your Medicare Wellness Visit. I appreciate your ongoing commitment to your health goals. Please review the following plan we discussed and let me know if I can assist you in the future.   These are the goals we discussed:  Goals      To maintain my current health status by continuing to eat healthy, stay physically active and socially active.        This is a list of the screening recommended for you and due dates:  Health Maintenance  Topic Date Due   DTaP/Tdap/Td vaccine (1 - Tdap) Never done   Zoster (Shingles) Vaccine (1 of 2) Never done   Pneumonia Vaccine (2 - PPSV23 or PCV20) 12/24/2004   COVID-19 Vaccine (6 - 2023-24 season) 12/10/2022   Medicare Annual Wellness Visit  12/26/2023   Flu Shot  Completed   HPV Vaccine  Aged Out    Advanced directives: Yes  Conditions/risks identified: Yes  Next appointment: Follow up in one year for your annual wellness visit.   Preventive Care 21 Years and Older, Male  Preventive care refers to lifestyle choices and visits with your health care provider that can promote health and wellness. What does preventive care include? A yearly physical exam. This is also called an annual well check. Dental exams once or twice a year. Routine eye exams. Ask your health care provider how often you should have your eyes checked. Personal lifestyle choices, including: Daily care of your teeth and gums. Regular physical activity. Eating a healthy diet. Avoiding tobacco and drug use. Limiting alcohol use. Practicing safe sex. Taking low doses of aspirin every day. Taking vitamin and mineral supplements as recommended by your health care provider. What happens during an annual well check? The services and screenings done by your health care provider during your annual well check will depend on your age, overall health, lifestyle risk factors, and family history of disease. Counseling  Your  health care provider may ask you questions about your: Alcohol use. Tobacco use. Drug use. Emotional well-being. Home and relationship well-being. Sexual activity. Eating habits. History of falls. Memory and ability to understand (cognition). Work and work Statistician. Screening  You may have the following tests or measurements: Height, weight, and BMI. Blood pressure. Lipid and cholesterol levels. These may be checked every 5 years, or more frequently if you are over 32 years old. Skin check. Lung cancer screening. You may have this screening every year starting at age 1 if you have a 30-pack-year history of smoking and currently smoke or have quit within the past 15 years. Fecal occult blood test (FOBT) of the stool. You may have this test every year starting at age 45. Flexible sigmoidoscopy or colonoscopy. You may have a sigmoidoscopy every 5 years or a colonoscopy every 10 years starting at age 19. Prostate cancer screening. Recommendations will vary depending on your family history and other risks. Hepatitis C blood test. Hepatitis B blood test. Sexually transmitted disease (STD) testing. Diabetes screening. This is done by checking your blood sugar (glucose) after you have not eaten for a while (fasting). You may have this done every 1-3 years. Abdominal aortic aneurysm (AAA) screening. You may need this if you are a current or former smoker. Osteoporosis. You may be screened starting at age 61 if you are at high risk. Talk with your health care provider about your test results, treatment options, and if necessary, the need for more tests.  Vaccines  Your health care provider may recommend certain vaccines, such as: Influenza vaccine. This is recommended every year. Tetanus, diphtheria, and acellular pertussis (Tdap, Td) vaccine. You may need a Td booster every 10 years. Zoster vaccine. You may need this after age 29. Pneumococcal 13-valent conjugate (PCV13) vaccine. One dose  is recommended after age 65. Pneumococcal polysaccharide (PPSV23) vaccine. One dose is recommended after age 89. Talk to your health care provider about which screenings and vaccines you need and how often you need them. This information is not intended to replace advice given to you by your health care provider. Make sure you discuss any questions you have with your health care provider. Document Released: 01/06/2016 Document Revised: 08/29/2016 Document Reviewed: 10/11/2015 Elsevier Interactive Patient Education  2017 [employer] Prevention in the Home Falls can cause injuries. They can happen to people of all ages. There are many things you can do to make your home safe and to help prevent falls. What can I do on the outside of my home? Regularly fix the edges of walkways and driveways and fix any cracks. Remove anything that might make you trip as you walk through a door, such as a raised step or threshold. Trim any bushes or trees on the path to your home. Use bright outdoor lighting. Clear any walking paths of anything that might make someone trip, such as rocks or tools. Regularly check to see if handrails are loose or broken. Make sure that both sides of any steps have handrails. Any raised decks and porches should have guardrails on the edges. Have any leaves, snow, or ice cleared regularly. Use sand or salt on walking paths during winter. Clean up any spills in your garage right away. This includes oil or grease spills. What can I do in the bathroom? Use night lights. Install grab bars by the toilet and in the tub and shower. Do not use towel bars as grab bars. Use non-skid mats or decals in the tub or shower. If you need to sit down in the shower, use a plastic, non-slip stool. Keep the floor dry. Clean up any water that spills on the floor as soon as it happens. Remove soap buildup in the tub or shower regularly. Attach bath mats securely with double-sided non-slip rug  tape. Do not have throw rugs and other things on the floor that can make you trip. What can I do in the bedroom? Use night lights. Make sure that you have a light by your bed that is easy to reach. Do not use any sheets or blankets that are too big for your bed. They should not hang down onto the floor. Have a firm chair that has side arms. You can use this for support while you get dressed. Do not have throw rugs and other things on the floor that can make you trip. What can I do in the kitchen? Clean up any spills right away. Avoid walking on wet floors. Keep items that you use a lot in easy-to-reach places. If you need to reach something above you, use a strong step stool that has a grab bar. Keep electrical cords out of the way. Do not use floor polish or wax that makes floors slippery. If you must use wax, use non-skid floor wax. Do not have throw rugs and other things on the floor that can make you trip. What can I do with my stairs? Do not leave any items on the stairs. Make sure that  there are handrails on both sides of the stairs and use them. Fix handrails that are broken or loose. Make sure that handrails are as long as the stairways. Check any carpeting to make sure that it is firmly attached to the stairs. Fix any carpet that is loose or worn. Avoid having throw rugs at the top or bottom of the stairs. If you do have throw rugs, attach them to the floor with carpet tape. Make sure that you have a light switch at the top of the stairs and the bottom of the stairs. If you do not have them, ask someone to add them for you. What else can I do to help prevent falls? Wear shoes that: Do not have high heels. Have rubber bottoms. Are comfortable and fit you well. Are closed at the toe. Do not wear sandals. If you use a stepladder: Make sure that it is fully opened. Do not climb a closed stepladder. Make sure that both sides of the stepladder are locked into place. Ask someone to  hold it for you, if possible. Clearly mark and make sure that you can see: Any grab bars or handrails. First and last steps. Where the edge of each step is. Use tools that help you move around (mobility aids) if they are needed. These include: Canes. Walkers. Scooters. Crutches. Turn on the lights when you go into a dark area. Replace any light bulbs as soon as they burn out. Set up your furniture so you have a clear path. Avoid moving your furniture around. If any of your floors are uneven, fix them. If there are any pets around you, be aware of where they are. Review your medicines with your doctor. Some medicines can make you feel dizzy. This can increase your chance of falling. Ask your doctor what other things that you can do to help prevent falls. This information is not intended to replace advice given to you by your health care provider. Make sure you discuss any questions you have with your health care provider. Document Released: 10/06/2009 Document Revised: 05/17/2016 Document Reviewed: 01/14/2015 Elsevier Interactive Patient Education  2017 [employer].

## 2022-12-31 ENCOUNTER — Ambulatory Visit (INDEPENDENT_AMBULATORY_CARE_PROVIDER_SITE_OTHER): Payer: Medicare HMO | Admitting: Emergency Medicine

## 2022-12-31 ENCOUNTER — Encounter: Payer: Self-pay | Admitting: Emergency Medicine

## 2022-12-31 VITALS — BP 132/76 | HR 59 | Temp 97.9°F | Ht 67.0 in | Wt 156.2 lb

## 2022-12-31 DIAGNOSIS — E785 Hyperlipidemia, unspecified: Secondary | ICD-10-CM

## 2022-12-31 DIAGNOSIS — I480 Paroxysmal atrial fibrillation: Secondary | ICD-10-CM | POA: Diagnosis not present

## 2022-12-31 DIAGNOSIS — R7303 Prediabetes: Secondary | ICD-10-CM | POA: Diagnosis not present

## 2022-12-31 DIAGNOSIS — N1831 Chronic kidney disease, stage 3a: Secondary | ICD-10-CM | POA: Diagnosis not present

## 2022-12-31 DIAGNOSIS — I2510 Atherosclerotic heart disease of native coronary artery without angina pectoris: Secondary | ICD-10-CM | POA: Diagnosis not present

## 2022-12-31 DIAGNOSIS — I10 Essential (primary) hypertension: Secondary | ICD-10-CM | POA: Diagnosis not present

## 2022-12-31 NOTE — Assessment & Plan Note (Signed)
Advised to stay well-hydrated and avoid NSAIDs. ?

## 2022-12-31 NOTE — Patient Instructions (Signed)
Health Maintenance After Age 84 After age 84, you are at a higher risk for certain long-term diseases and infections as well as injuries from falls. Falls are a major cause of broken bones and head injuries in people who are older than age 84. Getting regular preventive care can help to keep you healthy and well. Preventive care includes getting regular testing and making lifestyle changes as recommended by your health care provider. Talk with your health care provider about: Which screenings and tests you should have. A screening is a test that checks for a disease when you have no symptoms. A diet and exercise plan that is right for you. What should I know about screenings and tests to prevent falls? Screening and testing are the best ways to find a health problem early. Early diagnosis and treatment give you the best chance of managing medical conditions that are common after age 84. Certain conditions and lifestyle choices may make you more likely to have a fall. Your health care provider may recommend: Regular vision checks. Poor vision and conditions such as cataracts can make you more likely to have a fall. If you wear glasses, make sure to get your prescription updated if your vision changes. Medicine review. Work with your health care provider to regularly review all of the medicines you are taking, including over-the-counter medicines. Ask your health care provider about any side effects that may make you more likely to have a fall. Tell your health care provider if any medicines that you take make you feel dizzy or sleepy. Strength and balance checks. Your health care provider may recommend certain tests to check your strength and balance while standing, walking, or changing positions. Foot health exam. Foot pain and numbness, as well as not wearing proper footwear, can make you more likely to have a fall. Screenings, including: Osteoporosis screening. Osteoporosis is a condition that causes  the bones to get weaker and break more easily. Blood pressure screening. Blood pressure changes and medicines to control blood pressure can make you feel dizzy. Depression screening. You may be more likely to have a fall if you have a fear of falling, feel depressed, or feel unable to do activities that you used to do. Alcohol use screening. Using too much alcohol can affect your balance and may make you more likely to have a fall. Follow these instructions at home: Lifestyle Do not drink alcohol if: Your health care provider tells you not to drink. If you drink alcohol: Limit how much you have to: 0-1 drink a day for women. 0-2 drinks a day for men. Know how much alcohol is in your drink. In the [place] one drink equals one 12 oz bottle of beer (355 mL), one 5 oz glass of wine (148 mL), or one 1 oz glass of hard liquor (44 mL). Do not use any products that contain nicotine or tobacco. These products include cigarettes, chewing tobacco, and vaping devices, such as e-cigarettes. If you need help quitting, ask your health care provider. Activity  Follow a regular exercise program to stay fit. This will help you maintain your balance. Ask your health care provider what types of exercise are appropriate for you. If you need a cane or walker, use it as recommended by your health care provider. Wear supportive shoes that have nonskid soles. Safety  Remove any tripping hazards, such as rugs, cords, and clutter. Install safety equipment such as grab bars in bathrooms and safety rails on stairs. Keep rooms and walkways   well-lit. General instructions Talk with your health care provider about your risks for falling. Tell your health care provider if: You fall. Be sure to tell your health care provider about all falls, even ones that seem minor. You feel dizzy, tiredness (fatigue), or off-balance. Take over-the-counter and prescription medicines only as told by your health care provider. These include  supplements. Eat a healthy diet and maintain a healthy weight. A healthy diet includes low-fat dairy products, low-fat (lean) meats, and fiber from whole grains, beans, and lots of fruits and vegetables. Stay current with your vaccines. Schedule regular health, dental, and eye exams. Summary Having a healthy lifestyle and getting preventive care can help to protect your health and wellness after age 84. Screening and testing are the best way to find a health problem early and help you avoid having a fall. Early diagnosis and treatment give you the best chance for managing medical conditions that are more common for people who are older than age 84. Falls are a major cause of broken bones and head injuries in people who are older than age 84. Take precautions to prevent a fall at home. Work with your health care provider to learn what changes you can make to improve your health and wellness and to prevent falls. This information is not intended to replace advice given to you by your health care provider. Make sure you discuss any questions you have with your health care provider. Document Revised: 05/01/2021 Document Reviewed: 05/01/2021 Elsevier Patient Education  2023 Elsevier Inc.  

## 2022-12-31 NOTE — Assessment & Plan Note (Signed)
Stable.  Continue atorvastatin 40 mg and fenofibrate 145 mg daily

## 2022-12-31 NOTE — Assessment & Plan Note (Signed)
Stable.  No anginal episodes. 

## 2022-12-31 NOTE — Assessment & Plan Note (Signed)
Stable.  Well-controlled rate with carvedilol 12.5 mg twice a day. Continue baby aspirin daily

## 2022-12-31 NOTE — Progress Notes (Signed)
Xavier Bell 82 y.o.   Chief Complaint  Patient presents with   Follow-up    43mth f/u appt,no concerns     HISTORY OF PRESENT ILLNESS: This is a 82y.o. male A1A here for 686-monthollow-up of chronic medical problems Recent cardiology follow-up without any concerns. Overall doing well.  Has no complaints or medical concerns today.  HPI   Prior to Admission medications   Medication Sig Start Date End Date Taking? Authorizing Provider  amLODipine (NORVASC) 10 MG tablet TAKE 1 TABLET EVERY DAY (NEED MD APPOINTMENT) 12/10/22  Yes [name], PhWonda ChengMD  Ascorbic Acid (VITAMIN C) 1000 MG tablet Take 1,000 mg by mouth daily.   Yes [provider]  aspirin 81 MG tablet Take 81 mg by mouth daily.   Yes [provider]  atorvastatin (LIPITOR) 40 MG tablet TAKE 1 TABLET EVERY DAY 12/10/22  Yes [name], PhWonda ChengMD  BRIMONIDINE TARTRATE OP Apply 1 drop to eye 2 (two) times daily.   Yes [provider]  carvedilol (COREG) 12.5 MG tablet TAKE 1 TABLET TWICE DAILY 12/13/22  Yes [name], PhWonda ChengMD  chlorthalidone (HYGROTON) 25 MG tablet TAKE 1/2 TABLET EVERY DAY 12/10/22  Yes [name], PhWonda ChengMD  Cholecalciferol (VITAMIN D) 2000 units tablet Take 2,000 Units by mouth daily.   Yes [provider]  dorzolamide-timolol (COSOPT) 22.3-6.8 MG/ML ophthalmic solution Place 2 drops into both eyes 2 (two) times daily.   Yes [provider]  fenofibrate (TRICOR) 145 MG tablet TAKE 1 TABLET EVERY DAY 12/10/22  Yes [name], PhWonda ChengMD  latanoprost (XALATAN) 0.005 % ophthalmic solution Place 1 drop into both eyes at bedtime.  07/15/17  Yes [provider]  loratadine (CLARITIN) 10 MG tablet Take 10 mg by mouth daily.   Yes [provider]  Multiple Vitamin (MULTIVITAMIN) tablet Take 1 tablet by mouth daily.   Yes [provider]  Omega-3 Fatty Acids (OMEGA 3 PO) Take 1 capsule by mouth daily.   Yes [provider]   potassium chloride SA (KLOR-CON M) 20 MEQ tablet Take 1 tablet (20 mEq total) by mouth daily. 10/08/22  Yes [name], PhWonda ChengMD  Probiotic Product (PROBIOTIC BLEND PO) Take 1 tablet by mouth daily at 12 noon.   Yes [provider]  valsartan (DIOVAN) 320 MG tablet Take 1 tablet (320 mg total) by mouth daily. 11/13/22  Yes [name], PhWonda ChengMD  Zinc 50 MG CAPS Take 50 mg by mouth daily.   Yes [provider]    Allergies  Allergen Reactions   Niacin And Related Other (See Comments)    Stomach Pain and flushing     Patient Active Problem List   Diagnosis Date Noted   Prediabetes 01/01/2022   Stage 3a chronic kidney disease ([facility]/08/2022   Mixed hyperlipidemia 12/31/2016   Carotid artery disease ([facility]   HTN (hypertension) 12/31/2011   Vitamin D deficiency 12/31/2011   Atrial flutter ([facility]/16/2012   Coronary artery disease    Hypertension    Dyslipidemia    MI (myocardial infarction) ([facility]   Retinal detachment    Pseudoaphakia    [facility]    Past Medical History:  Diagnosis Date   Atrial flutter ([facility]   s/p cardioversion 02/08/12; ablation by [name] Chronic anticoagulation    Coronary artery disease    s/p remote MI in 1994 with multiple PCI and subsequent CABG x 5 in  1996   Dyslipidemia    Hypertension    MI (myocardial infarction) ([place]) 1995   Normal nuclear stress test 2006   Pseudoaphakia    both eyes   Retinal detachment     Past Surgical History:  Procedure Laterality Date   ATRIAL FLUTTER ABLATION N/A 12/08/2014   CTI by [name]   CARDIAC CATHETERIZATION  1996   CARDIOVERSION  02/08/2012   Procedure: CARDIOVERSION;  Surgeon: Darden Amber., MD;  Location: [place];  Service: Cardiovascular;  Laterality: N/A;   CORONARY ARTERY BYPASS GRAFT  1996   LIMA to LAD, SVG to RV marginal & PD, SVG  to DX and SVG to Ramus   ORIF ANKLE FRACTURE Left 12/10/2019   Procedure: Open Reduction Internal  fixation (ORIF) Left Ankle Fracture;  Surgeon: [name], MD;  Location: [place];  Service: Orthopedics;  Laterality: Left;   TONSILLECTOMY AND ADENOIDECTOMY      Social History   Socioeconomic History   Marital status: Married    Spouse name: Not on file   Number of children: Not on file   Years of education: Not on file   Highest education level: Not on file  Occupational History   Not on file  Tobacco Use   Smoking status: Never   Smokeless tobacco: Never  Vaping Use   Vaping Use: Never used  Substance and Sexual Activity   Alcohol use: No   Drug use: No   Sexual activity: Not on file  Other Topics Concern   Not on file  Social History Narrative   Not on file   Social Determinants of Health   Financial Resource Strain: Low Risk  (12/25/2022)   Overall Financial Resource Strain (CARDIA)    Difficulty of Paying Living Expenses: Not hard at all  Food Insecurity: No Food Insecurity (12/25/2022)   Hunger Vital Sign    Worried About Running Out of Food in the Last Year: Never true    [place] in the Last Year: Never true  Transportation Needs: No Transportation Needs (12/25/2022)   PRAPARE - Hydrologist (Medical): No    Lack of Transportation (Non-Medical): No  Physical Activity: Sufficiently Active (12/25/2022)   Exercise Vital Sign    Days of Exercise per Week: 6 days    Minutes of Exercise per Session: 150+ min  Stress: No Stress Concern Present (12/25/2022)   [place]    Feeling of Stress : Not at all  Social Connections: [place] (12/25/2022)   Social Connection and Isolation Panel [NHANES]    Frequency of Communication with Friends and Family: More than three times a week    Frequency of Social Gatherings with Friends and Family: More than three times a week    Attends Religious Services: More than 4 times per year    Active Member of  [employer] or Organizations: Yes    Attends Music therapist: More than 4 times per year    Marital Status: Married  Human resources officer Violence: Not At Risk (12/25/2022)   Humiliation, Afraid, Rape, and Kick questionnaire    Fear of Current or Ex-Partner: No    Emotionally Abused: No    Physically Abused: No    Sexually Abused: No    Family History  Problem Relation Age of Onset   Heart disease Mother    Heart disease Father    Coronary artery disease Other  Review of Systems  Constitutional: Negative.  Negative for chills and fever.  HENT: Negative.  Negative for congestion and sore throat.   Respiratory: Negative.  Negative for cough and shortness of breath.   Cardiovascular: Negative.  Negative for chest pain and palpitations.  Gastrointestinal:  Negative for abdominal pain, diarrhea, nausea and vomiting.  Genitourinary:  Negative for hematuria.  Skin: Negative.  Negative for rash.  Neurological: Negative.  Negative for dizziness and headaches.  All other systems reviewed and are negative.   Today's Vitals   12/31/22 0945  BP: 132/76  Pulse: (!) 59  Temp: 97.9 F (36.6 C)  TempSrc: Oral  SpO2: 94%  Weight: 156 lb 4 oz (70.9 kg)  Height: '5\' 7"'$  (1.702 m)   Body mass index is 24.47 kg/m. Wt Readings from Last 3 Encounters:  12/31/22 156 lb 4 oz (70.9 kg)  12/25/22 153 lb 3.2 oz (69.5 kg)  10/29/22 153 lb 3.2 oz (69.5 kg)    Physical Exam Vitals reviewed.  Constitutional:      Appearance: Normal appearance.  HENT:     Head: Normocephalic.     Mouth/Throat:     Mouth: Mucous membranes are moist.     Pharynx: Oropharynx is clear.  Eyes:     Extraocular Movements: Extraocular movements intact.     Pupils: Pupils are equal, round, and reactive to light.  Cardiovascular:     Rate and Rhythm: Normal rate and regular rhythm.     Pulses: Normal pulses.     Heart sounds: Normal heart sounds.  Pulmonary:     Effort: Pulmonary effort is normal.      Breath sounds: Normal breath sounds.  Musculoskeletal:     Cervical back: No tenderness.     Right lower leg: No edema.     Left lower leg: No edema.  Lymphadenopathy:     Cervical: No cervical adenopathy.  Neurological:     General: No focal deficit present.     Mental Status: He is alert and oriented to person, place, and time.  Psychiatric:        Mood and Affect: Mood normal.        Behavior: Behavior normal.      ASSESSMENT & PLAN: A total of 44 minutes was spent with the patient and counseling/coordination of care regarding preparing for this visit, review of most recent office visit notes, review of most recent cardiologist office visit notes, review of multiple chronic medical conditions under management, review of all medications, review of most recent blood work results, cardiovascular risks associated with hypertension, education on nutrition, prognosis, documentation, and need for follow-up.  Problem List Items Addressed This Visit       Cardiovascular and Mediastinum   Coronary artery disease    Stable.  No anginal episodes.      Hypertension - Primary    Well-controlled hypertension Continue valsartan 320 mg daily, chlorthalidone 12.5 mg daily, carvedilol 12.5 mg twice a day and amlodipine 10 mg daily Cardiovascular risks associated with hypertension discussed.      Atrial fibrillation (HCC)    Stable.  Well-controlled rate with carvedilol 12.5 mg twice a day. Continue baby aspirin daily        Genitourinary   Stage 3a chronic kidney disease ([place])    Advised to stay well-hydrated and avoid NSAIDs.        Other   Dyslipidemia    Stable.  Continue atorvastatin 40 mg and fenofibrate 145 mg daily  Prediabetes    Diet and nutrition discussed.  Advised to decrease amount of daily carbohydrate intake and increase amount of plant based protein in his diet.      Patient Instructions  Health Maintenance After Age 73 After age 107, you are at a higher  risk for certain long-term diseases and infections as well as injuries from falls. Falls are a major cause of broken bones and head injuries in people who are older than age 6. Getting regular preventive care can help to keep you healthy and well. Preventive care includes getting regular testing and making lifestyle changes as recommended by your health care provider. Talk with your health care provider about: Which screenings and tests you should have. A screening is a test that checks for a disease when you have no symptoms. A diet and exercise plan that is right for you. What should I know about screenings and tests to prevent falls? Screening and testing are the best ways to find a health problem early. Early diagnosis and treatment give you the best chance of managing medical conditions that are common after age 69. Certain conditions and lifestyle choices may make you more likely to have a fall. Your health care provider may recommend: Regular vision checks. Poor vision and conditions such as cataracts can make you more likely to have a fall. If you wear glasses, make sure to get your prescription updated if your vision changes. Medicine review. Work with your health care provider to regularly review all of the medicines you are taking, including over-the-counter medicines. Ask your health care provider about any side effects that may make you more likely to have a fall. Tell your health care provider if any medicines that you take make you feel dizzy or sleepy. Strength and balance checks. Your health care provider may recommend certain tests to check your strength and balance while standing, walking, or changing positions. Foot health exam. Foot pain and numbness, as well as not wearing proper footwear, can make you more likely to have a fall. Screenings, including: Osteoporosis screening. Osteoporosis is a condition that causes the bones to get weaker and break more easily. Blood pressure  screening. Blood pressure changes and medicines to control blood pressure can make you feel dizzy. Depression screening. You may be more likely to have a fall if you have a fear of falling, feel depressed, or feel unable to do activities that you used to do. Alcohol use screening. Using too much alcohol can affect your balance and may make you more likely to have a fall. Follow these instructions at home: Lifestyle Do not drink alcohol if: Your health care provider tells you not to drink. If you drink alcohol: Limit how much you have to: 0-1 drink a day for women. 0-2 drinks a day for men. Know how much alcohol is in your drink. In the [place] one drink equals one 12 oz bottle of beer (355 mL), one 5 oz glass of wine (148 mL), or one 1 oz glass of hard liquor (44 mL). Do not use any products that contain nicotine or tobacco. These products include cigarettes, chewing tobacco, and vaping devices, such as e-cigarettes. If you need help quitting, ask your health care provider. Activity  Follow a regular exercise program to stay fit. This will help you maintain your balance. Ask your health care provider what types of exercise are appropriate for you. If you need a cane or walker, use it as recommended by your health care provider. Wear  supportive shoes that have nonskid soles. Safety  Remove any tripping hazards, such as rugs, cords, and clutter. Install safety equipment such as grab bars in bathrooms and safety rails on stairs. Keep rooms and walkways well-lit. General instructions Talk with your health care provider about your risks for falling. Tell your health care provider if: You fall. Be sure to tell your health care provider about all falls, even ones that seem minor. You feel dizzy, tiredness (fatigue), or off-balance. Take over-the-counter and prescription medicines only as told by your health care provider. These include supplements. Eat a healthy diet and maintain a healthy  weight. A healthy diet includes low-fat dairy products, low-fat (lean) meats, and fiber from whole grains, beans, and lots of fruits and vegetables. Stay current with your vaccines. Schedule regular health, dental, and eye exams. Summary Having a healthy lifestyle and getting preventive care can help to protect your health and wellness after age 74. Screening and testing are the best way to find a health problem early and help you avoid having a fall. Early diagnosis and treatment give you the best chance for managing medical conditions that are more common for people who are older than age 59. Falls are a major cause of broken bones and head injuries in people who are older than age 36. Take precautions to prevent a fall at home. Work with your health care provider to learn what changes you can make to improve your health and wellness and to prevent falls. This information is not intended to replace advice given to you by your health care provider. Make sure you discuss any questions you have with your health care provider. Document Revised: 05/01/2021 Document Reviewed: 05/01/2021 Elsevier Patient Education  [place], MD [facility]

## 2022-12-31 NOTE — Assessment & Plan Note (Signed)
Diet and nutrition discussed.  Advised to decrease amount of daily carbohydrate intake and increase amount of plant based protein in his diet.

## 2022-12-31 NOTE — Assessment & Plan Note (Signed)
Well-controlled hypertension Continue valsartan 320 mg daily, chlorthalidone 12.5 mg daily, carvedilol 12.5 mg twice a day and amlodipine 10 mg daily Cardiovascular risks associated with hypertension discussed.

## 2023-02-09 ENCOUNTER — Other Ambulatory Visit: Payer: Self-pay | Admitting: Cardiovascular Disease

## 2023-04-01 DIAGNOSIS — H401111 Primary open-angle glaucoma, right eye, mild stage: Secondary | ICD-10-CM | POA: Diagnosis not present

## 2023-04-01 DIAGNOSIS — H401122 Primary open-angle glaucoma, left eye, moderate stage: Secondary | ICD-10-CM | POA: Diagnosis not present

## 2023-04-29 DIAGNOSIS — H401111 Primary open-angle glaucoma, right eye, mild stage: Secondary | ICD-10-CM | POA: Diagnosis not present

## 2023-04-29 DIAGNOSIS — H401122 Primary open-angle glaucoma, left eye, moderate stage: Secondary | ICD-10-CM | POA: Diagnosis not present

## 2023-05-21 ENCOUNTER — Ambulatory Visit (HOSPITAL_COMMUNITY)
Admission: RE | Admit: 2023-05-21 | Discharge: 2023-05-21 | Disposition: A | Discharge status: Home / Self Care | Payer: Medicare HMO | Source: Ambulatory Visit | Attending: Internal Medicine | Admitting: Internal Medicine

## 2023-05-21 DIAGNOSIS — I6523 Occlusion and stenosis of bilateral carotid arteries: Secondary | ICD-10-CM | POA: Diagnosis not present

## 2023-05-24 ENCOUNTER — Telehealth: Payer: Self-pay | Admitting: Cardiovascular Disease

## 2023-05-24 DIAGNOSIS — I6523 Occlusion and stenosis of bilateral carotid arteries: Secondary | ICD-10-CM

## 2023-05-24 NOTE — Telephone Encounter (Signed)
-----   Message from Vesta Mixer, MD sent at 05/22/2023  9:09 AM EDT ----- Right carotid artery:  mild disease  Left carotid artery - moderate plaque   Repeat carotid duplex in 1 year

## 2023-05-24 NOTE — Telephone Encounter (Signed)
Called and spoke with patient who verbalized understanding and repeat order has been placed at this time.

## 2023-06-02 DIAGNOSIS — R051 Acute cough: Secondary | ICD-10-CM | POA: Diagnosis not present

## 2023-06-02 DIAGNOSIS — U071 COVID-19: Secondary | ICD-10-CM | POA: Diagnosis not present

## 2023-07-01 DIAGNOSIS — H401122 Primary open-angle glaucoma, left eye, moderate stage: Secondary | ICD-10-CM | POA: Diagnosis not present

## 2023-07-01 DIAGNOSIS — H401111 Primary open-angle glaucoma, right eye, mild stage: Secondary | ICD-10-CM | POA: Diagnosis not present

## 2023-07-22 DIAGNOSIS — Z961 Presence of intraocular lens: Secondary | ICD-10-CM | POA: Diagnosis not present

## 2023-07-22 DIAGNOSIS — H401122 Primary open-angle glaucoma, left eye, moderate stage: Secondary | ICD-10-CM | POA: Diagnosis not present

## 2023-07-22 DIAGNOSIS — H401111 Primary open-angle glaucoma, right eye, mild stage: Secondary | ICD-10-CM | POA: Diagnosis not present

## 2023-07-22 DIAGNOSIS — Z8669 Personal history of other diseases of the nervous system and sense organs: Secondary | ICD-10-CM | POA: Insufficient documentation

## 2023-07-22 DIAGNOSIS — H524 Presbyopia: Secondary | ICD-10-CM | POA: Diagnosis not present

## 2023-07-22 DIAGNOSIS — H52203 Unspecified astigmatism, bilateral: Secondary | ICD-10-CM | POA: Diagnosis not present

## 2023-07-22 DIAGNOSIS — Z9889 Other specified postprocedural states: Secondary | ICD-10-CM | POA: Diagnosis not present

## 2023-10-14 ENCOUNTER — Other Ambulatory Visit: Payer: Self-pay | Admitting: Cardiovascular Disease

## 2023-10-17 ENCOUNTER — Other Ambulatory Visit: Payer: Self-pay | Admitting: Cardiovascular Disease

## 2023-10-21 ENCOUNTER — Telehealth: Payer: Self-pay | Admitting: Cardiovascular Disease

## 2023-10-21 MED ORDER — CHLORTHALIDONE 25 MG PO TABS: 12.5000 mg | ORAL_TABLET | Freq: Every day | ORAL | 0 refills | Status: DC

## 2023-10-21 NOTE — Telephone Encounter (Signed)
Pt states he needs to speak with a nurse about getting his prescriptions filled because he states it was denied through [facility]. Please advise

## 2023-10-21 NOTE — Telephone Encounter (Signed)
Patient reports that a refill on his chlorthalidone was denied so he wanted to see if he should still be taking it. He is scheduled for his yearly follow up with Dr. Elease Hashimoto on 11/12. Advised patient that I will send him in a refill and to keep appointment with Dr. Elease Hashimoto.

## 2023-10-25 ENCOUNTER — Other Ambulatory Visit: Payer: Self-pay | Admitting: Cardiovascular Disease

## 2023-11-04 ENCOUNTER — Encounter: Payer: Self-pay | Admitting: Cardiovascular Disease

## 2023-11-04 NOTE — Progress Notes (Unsigned)
Cardiology Office Note   Date:  11/05/2023   ID:  Bowden, Zills 03/08/1941, MRN [id number]  PCP:  Georgina Quint, MD  Cardiologist:   Kristeen Miss, MD   Chief Complaint  Patient presents with   Coronary Artery Disease   Hyperlipidemia   1. Coronary artery disease-status post CABG 2. Atrial flutter- s/p cardioversion 02/08/12 3. Hypertension 4. Right carotid bruit     Has a history of atrial flutter this past spring. He status post cardioversion. [name] done very well. He continues to take Xarelto.  [name] not been exercising nearly as much as he needs to. [name] also been careless with his diet and has picked up a few pounds.  March 06, 2013:  He is doing well.  No angina.  He is continuing to have foot problems - especially of the left foot.  He has intense pain in his heel when he walks.  Sept. 12, 2014:  He has done well.  Still not exercising as much.  Takes motrin at night.  He has left plantar faciatis.    March 19, 2014:  [place] is doing ok No CP or dyspnea.  No palpitations. He has gone back into atrial flutter.   He cannot tell that his HR is irreg.   He has a hx of a-flutter in the past ( 2014) and was treated with Xarelto and was cardioverted.   May 09, 2015 Kellum Adona Gallen is a 82 y.o. male who presents for follow up of his atrial flutter , HTN, CAD No CP , not exercising much .   Nov. 14, 2016: Doing well. BP is a bit high. Not much exercise ,  Sore feet.   May 28, 2016:  Doing well.  BP is normal  No CP , no dyspnea at rest.   Slight DOE with activity. Not exercising   Jan. 8, 2018:  [place] is seen today .   Doing well.   Not exercising much .   Knows that he needs to.  Aug. 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  No CP , no dyspnea  At breakfast take your medicine check your blood pressure an hour after you take your medicine about the ones first in the morning are always good to be follow-up okay will help a little bit so  blood pressure is up to 1 2130s using a wrist April 21, 2018:  [place] is seen back today for follow-up of his coronary artery disease and mild to moderate carotid artery disease.  Recent carotid duplex scan reveals moderate left internal carotid plaque and mild right internal carotid disease.  Hx of hyperlipidemia ,    Had some back issues  - has 3 bulging discs, 1 was herniated  Back has improved some with conservative management .   Does not think he will need surgery any time soon.   No CP or dyspnea   November 03, 2018: She is seen today for follow-up of his coronary artery disease and moderate carotid artery disease.  Is a moderate left internal carotid plaque.  He has mild right internal carotid disease.  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 19, 2020 :  [place] is seen today for follow up visit Has a hx of atrial flutter - is s/p ablation  .   - is in NSR  Today  BP is a bit elevated.  Has gained a few lbs.   February 15, 2020:  [place] is seen today for follow-up visit.  He has a history of coronary artery disease and is status post coronary artery bypass grafting.  He has moderate carotid artery disease.  Said a history of atrial flutter and is status post ablation. Fell through his ceiling from the attic.   Broke his left ankle ,  Had surgical repair of his ankle  Otherwise doing ok No covid vaccine yet.     Aug. 23, 2021:  [place] is seen today for follow up of his CAD .  BP has been elevated.  He bought a BP cuff. And gets variable readings.  He does not trust the cuff.  I reviewed his recordings   Admits that his diet is not what is should be.  Still eating chips,  Cheese  No angina . No dyspnea   Carotid study is unchanged.   Labs from 3 days ago are stable   Feb. 16, 2022: [place] is seen today for follow up of his CAD  HTN HLD Is exercising some .   Trying to  lose some weight . 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   BP log shows BP typically in the 140-150 range.   Nov. 7, 2022 [facility] is seen today for follow up of his CAD , HTN, HLD  Has had some HTN - especially at a time when he was eating lots of salt .  ( Wife was afway so he was eating lots of chips / salsa Diet has improved.  Records his sodium intake on legal pad.  Typically eats between 1000 - 1500 mg of sodium .  BP now is great.  Gave the ok to have oyster stew at Christmas.     Nov. 6, 2023  [place] is seen today for follow up of his CAD, HTN, HLD  No CP, Has chronic dyspnea .   Exercises regularly ,  Walks 200 - 250 minutes a week  Has had Aflutter ablation , is only on ASA 81 mg now  Nov. 12, 2024 [place] is seen for follow up of his CAD , HTN, HLD  No CP,  no dyspnea   BP is good 1-2 hours after he takes his morming meds.       Past Medical History:  Diagnosis Date   Atrial flutter (HCC)    s/p cardioversion 02/08/12; ablation by Dr Ladona Ridgel [id number]   Chronic anticoagulation    Coronary artery disease    s/p remote MI in 1994 with multiple PCI and subsequent CABG x 5 in 1996   Dyslipidemia    Hypertension    MI (myocardial infarction) (HCC) 1995   Normal nuclear stress test 2006   Pseudoaphakia    both eyes   Retinal detachment     Past Surgical History:  Procedure Laterality Date   ATRIAL FLUTTER ABLATION N/A 12/08/2014   CTI by Dr Ladona Ridgel   CARDIAC CATHETERIZATION  1996   CARDIOVERSION  02/08/2012   Procedure: CARDIOVERSION;  Surgeon: Elyn Aquas., MD;  Location: [facility] OR;  Service: Cardiovascular;  Laterality: N/A;   CORONARY ARTERY BYPASS GRAFT  1996   LIMA to LAD, SVG to RV marginal & PD, SVG  to DX and SVG to Ramus   ORIF ANKLE FRACTURE Left 12/10/2019   Procedure: Open Reduction Internal fixation (ORIF) Left Ankle  Fracture;  Surgeon: Toni Arthurs, MD;  Location: [facility];  Service: Orthopedics;  Laterality: Left;   TONSILLECTOMY AND ADENOIDECTOMY       Current Outpatient Medications  Medication Sig Dispense Refill   amLODipine (NORVASC) 10 MG tablet TAKE 1 TABLET EVERY DAY (NEED MD APPOINTMENT) 90 tablet 3   aspirin 81 MG tablet Take 81 mg by mouth daily.     atorvastatin (LIPITOR) 40 MG tablet TAKE 1 TABLET EVERY DAY 90 tablet 3   brimonidine (ALPHAGAN) 0.2 % ophthalmic solution SMARTSIG:In Eye(s)     carvedilol (COREG) 12.5 MG tablet TAKE 1 TABLET TWICE DAILY 180 tablet 3   chlorthalidone (HYGROTON) 25 MG tablet Take 0.5 tablets (12.5 mg total) by mouth daily. 45 tablet 0   Cholecalciferol (VITAMIN D) 2000 units tablet Take 2,000 Units by mouth daily.     dorzolamide-timolol (COSOPT) 22.3-6.8 MG/ML ophthalmic solution Place 2 drops into both eyes 2 (two) times daily.     fenofibrate (TRICOR) 145 MG tablet TAKE 1 TABLET EVERY DAY 90 tablet 3   latanoprost (XALATAN) 0.005 % ophthalmic solution Place 1 drop into both eyes at bedtime.      Multiple Vitamin (MULTIVITAMIN) tablet Take 1 tablet by mouth daily.     Omega-3 Fatty Acids (OMEGA 3 PO) Take 1 capsule by mouth daily.     potassium chloride SA (KLOR-CON M) 20 MEQ tablet Take 1 tablet (20 mEq total) by mouth daily. 90 tablet 3   valsartan (DIOVAN) 320 MG tablet TAKE 1 TABLET EVERY DAY 90 tablet 3   Zinc 50 MG CAPS Take 50 mg by mouth daily.     No current facility-administered medications for this visit.    Allergies:   Niacin and related    Social History:  The patient  reports that he has never smoked. He has never used smokeless tobacco. He reports that he does not drink alcohol and does not use drugs.   Family History:  The patient's family history includes Coronary artery disease in an other family member; Heart disease in his father and mother.    ROS:  Noted in current hx , otherwise , ROS is negative    Physical Exam: Blood pressure (!) 160/65, pulse 64, height 5\' 7"   (1.702 m), weight 157 lb 12.8 oz (71.6 kg), SpO2 94%.  HYPERTENSION CONTROL Vitals:   11/05/23 1455 11/05/23 1509  BP: (!) 164/62 (!) 160/65    The patient's blood pressure is elevated above target today.  In order to address the patient's elevated BP: Blood pressure will be monitored at home to determine if medication changes need to be made.       GEN:  Well nourished, well developed in no acute distress HEENT: Normal NECK: No JVD; No carotid bruits LYMPHATICS: No lymphadenopathy CARDIAC: RRR , no murmurs, rubs, gallops RESPIRATORY:  Clear to auscultation without rales, wheezing or rhonchi  ABDOMEN: Soft, non-tender, non-distended MUSCULOSKELETAL:  No edema; No deformity  SKIN: Warm and dry NEUROLOGIC:  Alert and oriented x 3   EKG:     EKG Interpretation Date/Time:  Tuesday November 05 2023 14:54:08 EST Ventricular Rate:  63 PR Interval:  308 QRS Duration:  122 QT Interval:  434 QTC Calculation: 444 R Axis:   109  Text Interpretation: Sinus rhythm with 1st degree A-V block Rightward axis Left ventricular hypertrophy with QRS widening and repolarization abnormality ( Cornell product ) When compared with ECG of 08-Dec-2014 16:44, Previous ECG has undetermined rhythm, needs review  QRS axis Shifted right Minimal criteria for Inferior infarct are no longer Present QT has shortened Confirmed by Kristeen Miss ([id number]) on 11/05/2023 3:10:42 PM        Recent Labs: No results found for requested labs within last 365 days.    Lipid Panel    Component Value Date/Time   CHOL 118 10/29/2022 1054   TRIG 101 10/29/2022 1054   HDL 38 (L) 10/29/2022 1054   CHOLHDL 3.1 10/29/2022 1054   CHOLHDL 2.7 05/28/2016 0816   VLDL 16 05/28/2016 0816   LDLCALC 61 10/29/2022 1054      Wt Readings from Last 3 Encounters:  11/05/23 157 lb 12.8 oz (71.6 kg)  12/31/22 156 lb 4 oz (70.9 kg)  12/25/22 153 lb 3.2 oz (69.5 kg)      Other studies Reviewed: Additional studies/ records  that were reviewed today include: . Review of the above records demonstrates:    ASSESSMENT AND PLAN:  1. Coronary artery disease-       no angina   2. Atrial flutter  -   remains in NSR     3. Hypertensive heart disease without CHF -       BP is well controlled at home  Is a bit elevated here .  Encouraged him to keep a BP log.   Bring with him to office visit .      4. Right carotid bruit-        5. Hyperlipidemia:    -    LDL was 61 on Nov. 2023      Current medicines are reviewed at length with the patient today.  The patient does not have concerns regarding medicines.     Labs/ tests ordered today include:   Orders Placed This Encounter  Procedures   ALT   Lipid panel   Basic metabolic panel   EKG 12-Lead      Disposition:          Kristeen Miss, MD  11/05/2023 3:21 PM    [facility] [address] [place], [place], [place]  [address] Phone: [phone]; Fax: [phone]

## 2023-11-05 ENCOUNTER — Ambulatory Visit: Payer: Medicare HMO | Attending: Cardiovascular Disease | Admitting: Cardiovascular Disease

## 2023-11-05 ENCOUNTER — Encounter: Payer: Self-pay | Admitting: Cardiovascular Disease

## 2023-11-05 VITALS — BP 160/65 | HR 64 | Ht 67.0 in | Wt 157.8 lb

## 2023-11-05 DIAGNOSIS — I2510 Atherosclerotic heart disease of native coronary artery without angina pectoris: Secondary | ICD-10-CM

## 2023-11-05 DIAGNOSIS — E782 Mixed hyperlipidemia: Secondary | ICD-10-CM | POA: Diagnosis not present

## 2023-11-05 NOTE — Patient Instructions (Signed)
Lab Work: Lipids, ALT, BMET today If you have labs (blood work) drawn today and your tests are completely normal, you will receive your results only by: MyChart Message (if you have MyChart) OR A paper copy in the mail If you have any lab test that is abnormal or we need to change your treatment, we will call you to review the results.  Follow-Up: At [facility], you and your health needs are our priority.  As part of our continuing mission to provide you with exceptional heart care, we have created designated Provider Care Teams.  These Care Teams include your primary Cardiologist (physician) and Advanced Practice Providers (APPs -  Physician Assistants and Nurse Practitioners) who all work together to provide you with the care you need, when you need it.  Your next appointment:   1 year(s)  Provider:   Kristeen Miss, MD       Other Instructions Omron BP cuff Get a large cuff that goes around your upper arm . These seem to be more accurate than wrist cuffs  **Check BP daily 1-2 hours after medications**

## 2023-11-06 LAB — LIPID PANEL
Chol/HDL Ratio: 4.1 ratio (ref 0.0–5.0)
Cholesterol, Total: 130 mg/dL (ref 100–199)
HDL: 32 mg/dL — ABNORMAL LOW (ref >39)
LDL Chol Calc (NIH): 68 mg/dL (ref 0–99)
Triglycerides: 174 mg/dL — ABNORMAL HIGH (ref 0–149)
VLDL Cholesterol Cal: 30 mg/dL (ref 5–40)

## 2023-11-06 LAB — BASIC METABOLIC PANEL
BUN/Creatinine Ratio: 26 — ABNORMAL HIGH (ref 10–24)
BUN: 30 mg/dL — ABNORMAL HIGH (ref 8–27)
CO2: 20 mmol/L (ref 20–29)
Calcium: 10.1 mg/dL (ref 8.6–10.2)
Chloride: 107 mmol/L — ABNORMAL HIGH (ref 96–106)
Creatinine, Ser: 1.15 mg/dL (ref 0.76–1.27)
Glucose: 109 mg/dL — ABNORMAL HIGH (ref 70–99)
Potassium: 4.2 mmol/L (ref 3.5–5.2)
Sodium: 140 mmol/L (ref 134–144)
eGFR: 63 mL/min/{1.73_m2} (ref >59)

## 2023-11-06 LAB — ALT: ALT: 24 [IU]/L (ref 0–44)

## 2023-11-11 ENCOUNTER — Other Ambulatory Visit: Payer: Self-pay | Admitting: Cardiovascular Disease

## 2023-11-11 DIAGNOSIS — H401122 Primary open-angle glaucoma, left eye, moderate stage: Secondary | ICD-10-CM | POA: Diagnosis not present

## 2023-11-11 DIAGNOSIS — H401111 Primary open-angle glaucoma, right eye, mild stage: Secondary | ICD-10-CM | POA: Diagnosis not present

## 2023-11-18 ENCOUNTER — Other Ambulatory Visit: Payer: Self-pay | Admitting: Cardiovascular Disease

## 2023-12-27 ENCOUNTER — Other Ambulatory Visit: Payer: Self-pay | Admitting: Cardiovascular Disease

## 2023-12-27 ENCOUNTER — Ambulatory Visit (INDEPENDENT_AMBULATORY_CARE_PROVIDER_SITE_OTHER): Payer: Medicare HMO

## 2023-12-27 VITALS — Ht 67.0 in | Wt 150.0 lb

## 2023-12-27 DIAGNOSIS — Z0000 Encounter for general adult medical examination without abnormal findings: Secondary | ICD-10-CM | POA: Diagnosis not present

## 2023-12-27 NOTE — Progress Notes (Signed)
 Subjective:   Xavier Bell is a 83 y.o. male who presents for Medicare Annual/Subsequent preventive examination.  Visit Complete: Virtual I connected with  Xavier Bell on 12/27/23 by a audio enabled telemedicine application and verified that I am speaking with the correct person using two identifiers.  Patient Location: Home  Provider Location: Office/Clinic  I discussed the limitations of evaluation and management by telemedicine. The patient expressed understanding and agreed to proceed.  Vital Signs: Because this visit was a virtual/telehealth visit, some criteria may be missing or patient reported. Any vitals not documented were not able to be obtained and vitals that have been documented are patient reported.   Cardiac Risk Factors include: advanced age (>59men, >12 women);male gender;Other (see comment);hypertension;dyslipidemia, Risk factor comments: CAD, A-Fib, MI, CKD     Objective:    Today's Vitals   12/27/23 0845  Weight: 150 lb (68 kg)  Height: 5' 7 (1.702 m)   Body mass index is 23.49 kg/m.     12/25/2022    3:43 PM 12/09/2019    3:34 PM 12/08/2014    7:30 AM  Advanced Directives  Does Patient Have a Medical Advance Directive? Yes No No  Type of Estate Agent of [place];Living will    Copy of Healthcare Power of Attorney in Chart? No - copy requested    Would patient like information on creating a medical advance directive?  No - Patient declined No - patient declined information    Current Medications (verified) Outpatient Encounter Medications as of 12/27/2023  Medication Sig   amLODipine  (NORVASC ) 10 MG tablet Take 1 tablet (10 mg total) by mouth daily.   aspirin  81 MG tablet Take 81 mg by mouth daily.   atorvastatin  (LIPITOR) 40 MG tablet TAKE 1 TABLET EVERY DAY   brimonidine  (ALPHAGAN ) 0.2 % ophthalmic solution SMARTSIG:In Eye(s)   carvedilol  (COREG ) 12.5 MG tablet TAKE 1 TABLET TWICE DAILY   chlorthalidone   (HYGROTON ) 25 MG tablet Take 0.5 tablets (12.5 mg total) by mouth daily.   Cholecalciferol (VITAMIN D ) 2000 units tablet Take 2,000 Units by mouth daily.   dorzolamide -timolol  (COSOPT ) 22.3-6.8 MG/ML ophthalmic solution Place 2 drops into both eyes 2 (two) times daily.   fenofibrate  (TRICOR ) 145 MG tablet TAKE 1 TABLET EVERY DAY   latanoprost  (XALATAN ) 0.005 % ophthalmic solution Place 1 drop into both eyes at bedtime.    Multiple Vitamin (MULTIVITAMIN) tablet Take 1 tablet by mouth daily.   Omega-3 Fatty Acids (OMEGA 3 PO) Take 1 capsule by mouth daily.   potassium chloride  SA (KLOR-CON  M) 20 MEQ tablet Take 1 tablet (20 mEq total) by mouth daily.   valsartan  (DIOVAN ) 320 MG tablet TAKE 1 TABLET EVERY DAY   Zinc 50 MG CAPS Take 50 mg by mouth daily.   No facility-administered encounter medications on file as of 12/27/2023.    Allergies (verified) Niacin  and related   History: Past Medical History:  Diagnosis Date   Atrial flutter (HCC)    s/p cardioversion 02/08/12; ablation by Dr Waddell [id number]   Chronic anticoagulation    Coronary artery disease    s/p remote MI in 1994 with multiple PCI and subsequent CABG x 5 in 1996   Dyslipidemia    Hypertension    MI (myocardial infarction) (HCC) 1995   Normal nuclear stress test 2006   Pseudoaphakia    both eyes   Retinal detachment    Past Surgical History:  Procedure Laterality Date   ATRIAL FLUTTER ABLATION N/A  12/08/2014   CTI by Dr Waddell   CARDIAC CATHETERIZATION  1996   CARDIOVERSION  02/08/2012   Procedure: CARDIOVERSION;  Surgeon: Xavier Alveta Raddle., Xavier Bell;  Location: [facility] OR;  Service: Cardiovascular;  Laterality: N/A;   CORONARY ARTERY BYPASS GRAFT  1996   LIMA to LAD, SVG to RV marginal & PD, SVG  to DX and SVG to Ramus   ORIF ANKLE FRACTURE Left 12/10/2019   Procedure: Open Reduction Internal fixation (ORIF) Left Ankle Fracture;  Surgeon: Kit Rush, Xavier Bell;  Location: [facility];  Service: Orthopedics;  Laterality:  Left;   TONSILLECTOMY AND ADENOIDECTOMY     Family History  Problem Relation Age of Onset   Heart disease Mother    Heart disease Father    Coronary artery disease Other    Social History   Socioeconomic History   Marital status: Married    Spouse name: Not on file   Number of children: Not on file   Years of education: Not on file   Highest education level: Not on file  Occupational History   Not on file  Tobacco Use   Smoking status: Never   Smokeless tobacco: Never  Vaping Use   Vaping status: Never Used  Substance and Sexual Activity   Alcohol use: No   Drug use: No   Sexual activity: Not on file  Other Topics Concern   Not on file  Social History Narrative   Lives with wife   Social Drivers of Health   Financial Resource Strain: Low Risk  (12/25/2022)   Overall Financial Resource Strain (CARDIA)    Difficulty of Paying Living Expenses: Not hard at all  Food Insecurity: No Food Insecurity (12/25/2022)   Hunger Vital Sign    Worried About Running Out of Food in the Last Year: Never true    Ran Out of Food in the Last Year: Never true  Transportation Needs: No Transportation Needs (12/25/2022)   PRAPARE - Administrator, Civil Service (Medical): No    Lack of Transportation (Non-Medical): No  Physical Activity: Sufficiently Active (12/25/2022)   Exercise Vital Sign    Days of Exercise per Week: 6 days    Minutes of Exercise per Session: 150+ min  Stress: No Stress Concern Present (12/25/2022)   [employer] of Occupational Health - Occupational Stress Questionnaire    Feeling of Stress : Not at all  Social Connections: Socially Integrated (12/25/2022)   Social Connection and Isolation Panel [NHANES]    Frequency of Communication with Friends and Family: More than three times a week    Frequency of Social Gatherings with Friends and Family: More than three times a week    Attends Religious Services: More than 4 times per year    Active Member of [employer] or  Organizations: Yes    Attends Engineer, Structural: More than 4 times per year    Marital Status: Married    Tobacco Counseling Counseling given: Not Answered   Clinical Intake:  Pre-visit preparation completed: Yes  Pain : No/denies pain     BMI - recorded: 23.49 Nutritional Status: BMI of 19-24  Normal Nutritional Risks: None Diabetes: No  How often do you need to have someone help you when you read instructions, pamphlets, or other written materials from your doctor or pharmacy?: 1 - Never  Interpreter Needed?: No  Information entered by :: Xavier Bell. RMA   Activities of Daily Living    12/27/2023    8:46  AM  In your present state of health, do you have any difficulty performing the following activities:  Hearing? 0  Vision? 0  Difficulty concentrating or making decisions? 0  Walking or climbing stairs? 0  Dressing or bathing? 0  Doing errands, shopping? 0  Preparing Food and eating ? N  Using the Toilet? N  In the past six months, have you accidently leaked urine? N  Do you have problems with loss of bowel control? N  Managing your Medications? N  Managing your Finances? N  Housekeeping or managing your Housekeeping? N    Patient Care Team: Xavier Emil Schanz, Xavier Bell as PCP - General (Internal Medicine) Xavier Bell, Xavier PARAS, Xavier Bell as PCP - Cardiology (Cardiology) Xavier Bell, Xavier Mcardle, Xavier Bell as Referring Physician (Ophthalmology) Xavier Bell, Xavier Bell as Consulting Physician (Optometry)  Indicate any recent Medical Services you may have received from other than [facility] providers in the past year (date may be approximate).     Assessment:   This is a routine wellness examination for Xavier Bell.  Hearing/Vision screen Hearing Screening - Comments:: Denies hearing difficulties   Vision Screening - Comments:: Wears eyeglasses   Goals Addressed   None   Depression Screen    12/31/2022    9:46 AM 12/25/2022    3:48 PM 12/27/2021    1:21 PM 01/18/2018    1:41 PM   PHQ 2/9 Scores  PHQ - 2 Score 0 0 0 0    Fall Risk    12/31/2022    9:46 AM 12/25/2022    3:45 PM 12/27/2021    1:21 PM 01/18/2018    1:41 PM  Fall Risk   Falls in the past year? 0 0 0 No  Number falls in past yr: 0 0 0   Injury with Fall? 0 0 0   Risk for fall due to : No Fall Risks No Fall Risks    Follow up Falls evaluation completed Falls prevention discussed      MEDICARE RISK AT HOME:    TIMED UP AND GO:  Was the test performed?  No    Cognitive Function:        12/25/2022    3:46 PM  6CIT Screen  What Year? 0 points  What month? 0 points  What time? 0 points  Count back from 20 0 points  Months in reverse 0 points  Repeat phrase 0 points  Total Score 0 points    Immunizations Immunization History  Administered Date(s) Administered   Influenza Inj Mdck Quad Pf 09/15/2017   Influenza Inj Mdck Quad With Preservative 10/11/2019   Influenza,inj,Quad PF,6+ Mos 10/05/2018   Influenza,inj,quad, With Preservative 08/24/2017   Influenza-Unspecified 08/24/2017, 10/07/2022   PFIZER(Purple Top)SARS-COV-2 Vaccination 03/06/2020, 03/27/2020, 10/06/2020, 07/07/2021, 10/15/2022   Pneumococcal Conjugate-13 12/25/2003    TDAP status: Due, Education has been provided regarding the importance of this vaccine. Advised may receive this vaccine at local pharmacy or Health Dept. Aware to provide a copy of the vaccination record if obtained from local pharmacy or Health Dept. Verbalized acceptance and understanding.  Flu Vaccine status: Due, Education has been provided regarding the importance of this vaccine. Advised may receive this vaccine at local pharmacy or Health Dept. Aware to provide a copy of the vaccination record if obtained from local pharmacy or Health Dept. Verbalized acceptance and understanding.  Pneumococcal vaccine status: Due, Education has been provided regarding the importance of this vaccine. Advised may receive this vaccine at local pharmacy or Health Dept.  Aware to provide  a copy of the vaccination record if obtained from local pharmacy or Health Dept. Verbalized acceptance and understanding.  Covid-19 vaccine status: Completed vaccines  Qualifies for Shingles Vaccine? Yes   Zostavax completed No   Shingrix Completed?: No.    Education has been provided regarding the importance of this vaccine. Patient has been advised to call insurance company to determine out of pocket expense if they have not yet received this vaccine. Advised may also receive vaccine at local pharmacy or Health Dept. Verbalized acceptance and understanding.  Screening Tests Health Maintenance  Topic Date Due   Zoster Vaccines- Shingrix (1 of 2) Never done   Pneumonia Vaccine 55+ Years old (2 of 2 - PPSV23 or PCV20) 02/19/2004   INFLUENZA VACCINE  07/25/2023   COVID-19 Vaccine (6 - 2024-25 season) 08/25/2023   Medicare Annual Wellness (AWV)  12/26/2024   HPV VACCINES  Aged Out   DTaP/Tdap/Td  Discontinued    Health Maintenance  Health Maintenance Due  Topic Date Due   Zoster Vaccines- Shingrix (1 of 2) Never done   Pneumonia Vaccine 21+ Years old (2 of 2 - PPSV23 or PCV20) 02/19/2004   INFLUENZA VACCINE  07/25/2023   COVID-19 Vaccine (6 - 2024-25 season) 08/25/2023    Colorectal cancer screening: No longer required.   Lung Cancer Screening: (Low Dose CT Chest recommended if Age 6-80 years, 20 pack-year currently smoking OR have quit w/in 15years.) does not qualify.   Lung Cancer Screening Referral: N/A  Additional Screening:  Hepatitis C Screening: does not qualify;   Vision Screening: Recommended annual ophthalmology exams for early detection of glaucoma and other disorders of the eye. Is the patient up to date with their annual eye exam?  Yes  Who is the provider or what is the name of the office in which the patient attends annual eye exams? Xavier Bell If pt is not established with a provider, would they like to be referred to a provider to establish  care? No .   Dental Screening: Recommended annual dental exams for proper oral hygiene   Community Resource Referral / Chronic Care Management: CRR required this visit?  No   CCM required this visit?  No     Plan:     I have personally reviewed and noted the following in the patient's chart:   Medical and social history Use of alcohol, tobacco or illicit drugs  Current medications and supplements including opioid prescriptions. Patient is not currently taking opioid prescriptions. Functional ability and status Nutritional status Physical activity Advanced directives List of other physicians Hospitalizations, surgeries, and ER visits in previous 12 months Vitals Screenings to include cognitive, depression, and falls Referrals and appointments  In addition, I have reviewed and discussed with patient certain preventive protocols, quality metrics, and best practice recommendations. A written personalized care plan for preventive services as well as general preventive health recommendations were provided to patient.     Xavier Bell, CMA   12/27/2023   After Visit Summary: (MyChart) Due to this being a telephonic visit, the after visit summary with patients personalized plan was offered to patient via MyChart   Nurse Notes: Patient is due for a Pneumonia vaccine but declines the Shingrix vaccine.  Patient stated that he has had a Flu shot, however, it has not been documented in [facility].  He is up to date on all other health maintenance.  Patient had no concerns to address today.

## 2023-12-27 NOTE — Patient Instructions (Signed)
 Mr. Benincasa , Thank you for taking time to come for your Medicare Wellness Visit. I appreciate your ongoing commitment to your health goals. Please review the following plan we discussed and let me know if I can assist you in the future.   Referrals/Orders/Follow-Ups/Clinician Recommendations: Keep up the good work.  This is a list of the screening recommended for you and due dates:  Health Maintenance  Topic Date Due   Flu Shot  03/23/2024*   Zoster (Shingles) Vaccine (1 of 2) 03/26/2024*   Pneumonia Vaccine (2 of 2 - PPSV23 or PCV20) 12/26/2024*   Medicare Annual Wellness Visit  12/26/2024   COVID-19 Vaccine  Completed   HPV Vaccine  Aged Out   DTaP/Tdap/Td vaccine  Discontinued  *Topic was postponed. The date shown is not the original due date.    Advanced directives: (Copy Requested) Please bring a copy of your health care power of attorney and living will to the office to be added to your chart at your convenience.  Next Medicare Annual Wellness Visit scheduled for next year: Yes

## 2023-12-31 ENCOUNTER — Other Ambulatory Visit: Payer: Self-pay | Admitting: Cardiovascular Disease

## 2024-03-28 ENCOUNTER — Other Ambulatory Visit: Payer: Self-pay

## 2024-03-28 ENCOUNTER — Encounter (HOSPITAL_COMMUNITY): Payer: Self-pay | Admitting: Emergency Medicine

## 2024-03-28 ENCOUNTER — Emergency Department (HOSPITAL_COMMUNITY)

## 2024-03-28 ENCOUNTER — Inpatient Hospital Stay (HOSPITAL_COMMUNITY)
Admission: EM | Admit: 2024-03-28 | Discharge: 2024-03-30 | DRG: 068 | Disposition: A | Discharge status: Home / Self Care | Attending: Internal Medicine | Admitting: Internal Medicine

## 2024-03-28 DIAGNOSIS — Z8249 Family history of ischemic heart disease and other diseases of the circulatory system: Secondary | ICD-10-CM

## 2024-03-28 DIAGNOSIS — M6281 Muscle weakness (generalized): Secondary | ICD-10-CM | POA: Diagnosis not present

## 2024-03-28 DIAGNOSIS — Z7902 Long term (current) use of antithrombotics/antiplatelets: Secondary | ICD-10-CM

## 2024-03-28 DIAGNOSIS — I441 Atrioventricular block, second degree: Secondary | ICD-10-CM | POA: Diagnosis not present

## 2024-03-28 DIAGNOSIS — H409 Unspecified glaucoma: Secondary | ICD-10-CM

## 2024-03-28 DIAGNOSIS — Z7901 Long term (current) use of anticoagulants: Secondary | ICD-10-CM | POA: Diagnosis not present

## 2024-03-28 DIAGNOSIS — D649 Anemia, unspecified: Secondary | ICD-10-CM | POA: Diagnosis not present

## 2024-03-28 DIAGNOSIS — Y900 Blood alcohol level of less than 20 mg/100 ml: Secondary | ICD-10-CM | POA: Diagnosis present

## 2024-03-28 DIAGNOSIS — R29700 NIHSS score 0: Secondary | ICD-10-CM | POA: Diagnosis not present

## 2024-03-28 DIAGNOSIS — I6521 Occlusion and stenosis of right carotid artery: Principal | ICD-10-CM | POA: Diagnosis present

## 2024-03-28 DIAGNOSIS — R001 Bradycardia, unspecified: Secondary | ICD-10-CM | POA: Diagnosis present

## 2024-03-28 DIAGNOSIS — I639 Cerebral infarction, unspecified: Secondary | ICD-10-CM

## 2024-03-28 DIAGNOSIS — Z7982 Long term (current) use of aspirin: Secondary | ICD-10-CM | POA: Diagnosis not present

## 2024-03-28 DIAGNOSIS — I2510 Atherosclerotic heart disease of native coronary artery without angina pectoris: Secondary | ICD-10-CM | POA: Diagnosis present

## 2024-03-28 DIAGNOSIS — I4891 Unspecified atrial fibrillation: Secondary | ICD-10-CM | POA: Diagnosis not present

## 2024-03-28 DIAGNOSIS — Z951 Presence of aortocoronary bypass graft: Secondary | ICD-10-CM | POA: Diagnosis not present

## 2024-03-28 DIAGNOSIS — E785 Hyperlipidemia, unspecified: Secondary | ICD-10-CM | POA: Diagnosis not present

## 2024-03-28 DIAGNOSIS — I4892 Unspecified atrial flutter: Secondary | ICD-10-CM | POA: Diagnosis present

## 2024-03-28 DIAGNOSIS — I63512 Cerebral infarction due to unspecified occlusion or stenosis of left middle cerebral artery: Secondary | ICD-10-CM | POA: Diagnosis not present

## 2024-03-28 DIAGNOSIS — Z888 Allergy status to other drugs, medicaments and biological substances status: Secondary | ICD-10-CM

## 2024-03-28 DIAGNOSIS — I739 Peripheral vascular disease, unspecified: Secondary | ICD-10-CM | POA: Diagnosis not present

## 2024-03-28 DIAGNOSIS — I10 Essential (primary) hypertension: Secondary | ICD-10-CM | POA: Diagnosis not present

## 2024-03-28 DIAGNOSIS — I480 Paroxysmal atrial fibrillation: Secondary | ICD-10-CM | POA: Diagnosis not present

## 2024-03-28 DIAGNOSIS — I63132 Cerebral infarction due to embolism of left carotid artery: Secondary | ICD-10-CM | POA: Diagnosis not present

## 2024-03-28 DIAGNOSIS — E119 Type 2 diabetes mellitus without complications: Secondary | ICD-10-CM | POA: Diagnosis present

## 2024-03-28 DIAGNOSIS — I6389 Other cerebral infarction: Secondary | ICD-10-CM | POA: Diagnosis not present

## 2024-03-28 DIAGNOSIS — I63212 Cerebral infarction due to unspecified occlusion or stenosis of left vertebral arteries: Secondary | ICD-10-CM | POA: Diagnosis not present

## 2024-03-28 DIAGNOSIS — R531 Weakness: Secondary | ICD-10-CM | POA: Diagnosis present

## 2024-03-28 DIAGNOSIS — R29704 NIHSS score 4: Secondary | ICD-10-CM | POA: Diagnosis present

## 2024-03-28 DIAGNOSIS — R29818 Other symptoms and signs involving the nervous system: Secondary | ICD-10-CM | POA: Diagnosis not present

## 2024-03-28 DIAGNOSIS — I63522 Cerebral infarction due to unspecified occlusion or stenosis of left anterior cerebral artery: Secondary | ICD-10-CM | POA: Diagnosis not present

## 2024-03-28 DIAGNOSIS — R4701 Aphasia: Principal | ICD-10-CM | POA: Diagnosis present

## 2024-03-28 DIAGNOSIS — I252 Old myocardial infarction: Secondary | ICD-10-CM

## 2024-03-28 DIAGNOSIS — I493 Ventricular premature depolarization: Secondary | ICD-10-CM | POA: Diagnosis present

## 2024-03-28 DIAGNOSIS — I6503 Occlusion and stenosis of bilateral vertebral arteries: Secondary | ICD-10-CM | POA: Diagnosis not present

## 2024-03-28 DIAGNOSIS — I6522 Occlusion and stenosis of left carotid artery: Secondary | ICD-10-CM

## 2024-03-28 DIAGNOSIS — Z79899 Other long term (current) drug therapy: Secondary | ICD-10-CM | POA: Diagnosis not present

## 2024-03-28 DIAGNOSIS — R4182 Altered mental status, unspecified: Secondary | ICD-10-CM | POA: Diagnosis not present

## 2024-03-28 DIAGNOSIS — I6523 Occlusion and stenosis of bilateral carotid arteries: Secondary | ICD-10-CM | POA: Diagnosis not present

## 2024-03-28 HISTORY — DX: Unspecified glaucoma: H40.9

## 2024-03-28 HISTORY — DX: Cerebral infarction, unspecified: I63.9

## 2024-03-28 LAB — CBC
HCT: 37.3 % — ABNORMAL LOW (ref 39.0–52.0)
Hemoglobin: 12.3 g/dL — ABNORMAL LOW (ref 13.0–17.0)
MCH: 31.9 pg (ref 26.0–34.0)
MCHC: 33 g/dL (ref 30.0–36.0)
MCV: 96.6 fL (ref 80.0–100.0)
Platelets: 295 10*3/uL (ref 150–400)
RBC: 3.86 MIL/uL — ABNORMAL LOW (ref 4.22–5.81)
RDW: 12.8 % (ref 11.5–15.5)
WBC: 6.2 10*3/uL (ref 4.0–10.5)
nRBC: 0 % (ref 0.0–0.2)

## 2024-03-28 LAB — COMPREHENSIVE METABOLIC PANEL WITH GFR
ALT: 26 U/L (ref 0–44)
AST: 39 U/L (ref 15–41)
Albumin: 3.8 g/dL (ref 3.5–5.0)
Alkaline Phosphatase: 67 U/L (ref 38–126)
Anion gap: 13 (ref 5–15)
BUN: 33 mg/dL — ABNORMAL HIGH (ref 8–23)
CO2: 20 mmol/L — ABNORMAL LOW (ref 22–32)
Calcium: 9.6 mg/dL (ref 8.9–10.3)
Chloride: 104 mmol/L (ref 98–111)
Creatinine, Ser: 1.37 mg/dL — ABNORMAL HIGH (ref 0.61–1.24)
GFR, Estimated: 51 mL/min — ABNORMAL LOW (ref >60)
Glucose, Bld: 113 mg/dL — ABNORMAL HIGH (ref 70–99)
Potassium: 3.6 mmol/L (ref 3.5–5.1)
Sodium: 137 mmol/L (ref 135–145)
Total Bilirubin: 1 mg/dL (ref 0.0–1.2)
Total Protein: 6.4 g/dL — ABNORMAL LOW (ref 6.5–8.1)

## 2024-03-28 LAB — I-STAT CHEM 8, ED
BUN: 32 mg/dL — ABNORMAL HIGH (ref 8–23)
Calcium, Ion: 1.22 mmol/L (ref 1.15–1.40)
Chloride: 106 mmol/L (ref 98–111)
Creatinine, Ser: 1.4 mg/dL — ABNORMAL HIGH (ref 0.61–1.24)
Glucose, Bld: 111 mg/dL — ABNORMAL HIGH (ref 70–99)
HCT: 35 % — ABNORMAL LOW (ref 39.0–52.0)
Hemoglobin: 11.9 g/dL — ABNORMAL LOW (ref 13.0–17.0)
Potassium: 4 mmol/L (ref 3.5–5.1)
Sodium: 140 mmol/L (ref 135–145)
TCO2: 21 mmol/L — ABNORMAL LOW (ref 22–32)

## 2024-03-28 LAB — DIFFERENTIAL
Abs Immature Granulocytes: 0.04 10*3/uL (ref 0.00–0.07)
Basophils Absolute: 0 10*3/uL (ref 0.0–0.1)
Basophils Relative: 1 %
Eosinophils Absolute: 0 10*3/uL (ref 0.0–0.5)
Eosinophils Relative: 0 %
Immature Granulocytes: 1 %
Lymphocytes Relative: 18 %
Lymphs Abs: 1.1 10*3/uL (ref 0.7–4.0)
Monocytes Absolute: 0.7 10*3/uL (ref 0.1–1.0)
Monocytes Relative: 12 %
Neutro Abs: 4.3 10*3/uL (ref 1.7–7.7)
Neutrophils Relative %: 68 %

## 2024-03-28 LAB — PROTIME-INR
INR: 1.2 (ref 0.8–1.2)
Prothrombin Time: 15.1 s (ref 11.4–15.2)

## 2024-03-28 LAB — RAPID URINE DRUG SCREEN, HOSP PERFORMED
Amphetamines: NOT DETECTED
Barbiturates: NOT DETECTED
Benzodiazepines: NOT DETECTED
Cocaine: NOT DETECTED
Opiates: NOT DETECTED
Tetrahydrocannabinol: NOT DETECTED

## 2024-03-28 LAB — PHOSPHORUS: Phosphorus: 3.3 mg/dL (ref 2.5–4.6)

## 2024-03-28 LAB — URINALYSIS, ROUTINE W REFLEX MICROSCOPIC
Bacteria, UA: NONE SEEN
Bilirubin Urine: NEGATIVE
Glucose, UA: NEGATIVE mg/dL
Ketones, ur: NEGATIVE mg/dL
Leukocytes,Ua: NEGATIVE
Nitrite: NEGATIVE
Protein, ur: NEGATIVE mg/dL
Specific Gravity, Urine: 1.019 (ref 1.005–1.030)
pH: 7 (ref 5.0–8.0)

## 2024-03-28 LAB — APTT: aPTT: 29 s (ref 24–36)

## 2024-03-28 LAB — ETHANOL: Alcohol, Ethyl (B): <10 mg/dL (ref <10)

## 2024-03-28 LAB — HEMOGLOBIN A1C
Hgb A1c MFr Bld: 5.8 % — ABNORMAL HIGH (ref 4.8–5.6)
Mean Plasma Glucose: 119.76 mg/dL

## 2024-03-28 LAB — TROPONIN I (HIGH SENSITIVITY): Troponin I (High Sensitivity): 32 ng/L — ABNORMAL HIGH (ref <18)

## 2024-03-28 LAB — CBG MONITORING, ED: Glucose-Capillary: 120 mg/dL — ABNORMAL HIGH (ref 70–99)

## 2024-03-28 MED ORDER — ATORVASTATIN CALCIUM 40 MG PO TABS
40.0000 mg | ORAL_TABLET | Freq: Every day | ORAL | Status: DC
  Administered 2024-03-29 – 2024-03-30 (×2): 40 mg via ORAL
  Filled 2024-03-28 (×3): qty 1

## 2024-03-28 MED ORDER — CLOPIDOGREL BISULFATE 75 MG PO TABS
75.0000 mg | ORAL_TABLET | Freq: Every day | ORAL | Status: DC
  Administered 2024-03-29: 75 mg via ORAL
  Filled 2024-03-28: qty 1

## 2024-03-28 MED ORDER — BRIMONIDINE TARTRATE 0.2 % OP SOLN
1.0000 [drp] | Freq: Two times a day (BID) | OPHTHALMIC | Status: DC
  Administered 2024-03-28 – 2024-03-30 (×4): 1 [drp] via OPHTHALMIC
  Filled 2024-03-28: qty 5

## 2024-03-28 MED ORDER — ACETAMINOPHEN 650 MG RE SUPP: 650.0000 mg | RECTAL | Status: DC | PRN

## 2024-03-28 MED ORDER — ASPIRIN 81 MG PO CHEW
81.0000 mg | CHEWABLE_TABLET | Freq: Every day | ORAL | Status: DC
  Administered 2024-03-29: 81 mg via ORAL
  Filled 2024-03-28 (×2): qty 1

## 2024-03-28 MED ORDER — FENOFIBRATE 160 MG PO TABS
160.0000 mg | ORAL_TABLET | Freq: Every day | ORAL | Status: DC
  Administered 2024-03-29 – 2024-03-30 (×2): 160 mg via ORAL
  Filled 2024-03-28 (×4): qty 1

## 2024-03-28 MED ORDER — CLOPIDOGREL BISULFATE 300 MG PO TABS
300.0000 mg | ORAL_TABLET | Freq: Once | ORAL | Status: AC
  Administered 2024-03-28: 300 mg via ORAL
  Filled 2024-03-28: qty 1

## 2024-03-28 MED ORDER — ACETAMINOPHEN 160 MG/5ML PO SOLN: 650.0000 mg | ORAL | Status: DC | PRN

## 2024-03-28 MED ORDER — DORZOLAMIDE HCL-TIMOLOL MAL 2-0.5 % OP SOLN
2.0000 [drp] | Freq: Two times a day (BID) | OPHTHALMIC | Status: DC
  Administered 2024-03-28 – 2024-03-30 (×4): 2 [drp] via OPHTHALMIC
  Filled 2024-03-28: qty 10

## 2024-03-28 MED ORDER — ACETAMINOPHEN 325 MG PO TABS: 650.0000 mg | ORAL_TABLET | ORAL | Status: DC | PRN

## 2024-03-28 MED ORDER — IOHEXOL 350 MG/ML SOLN
75.0000 mL | Freq: Once | INTRAVENOUS | Status: AC | PRN
  Administered 2024-03-28: 75 mL via INTRAVENOUS

## 2024-03-28 MED ORDER — SENNOSIDES-DOCUSATE SODIUM 8.6-50 MG PO TABS: 1.0000 | ORAL_TABLET | Freq: Every evening | ORAL | Status: DC | PRN

## 2024-03-28 MED ORDER — LATANOPROST 0.005 % OP SOLN
1.0000 [drp] | Freq: Every day | OPHTHALMIC | Status: DC
  Administered 2024-03-28 – 2024-03-29 (×2): 1 [drp] via OPHTHALMIC
  Filled 2024-03-28: qty 2.5

## 2024-03-28 MED ORDER — STROKE: EARLY STAGES OF RECOVERY BOOK
Freq: Once | Status: AC
  Filled 2024-03-28: qty 1

## 2024-03-28 MED ORDER — ENOXAPARIN SODIUM 40 MG/0.4ML IJ SOSY
40.0000 mg | PREFILLED_SYRINGE | INTRAMUSCULAR | Status: DC
  Administered 2024-03-28 – 2024-03-29 (×2): 40 mg via SUBCUTANEOUS
  Filled 2024-03-28 (×2): qty 0.4

## 2024-03-28 NOTE — ED Notes (Signed)
 The nurse went to nurse sec, to page on call provider about pt night time BP meds

## 2024-03-28 NOTE — ED Notes (Signed)
 NT called CCMD@10 :15am

## 2024-03-28 NOTE — ED Notes (Signed)
 ED TO INPATIENT HANDOFF REPORT  ED Nurse Name and Phone #: Alvina Chou [phone]  S Name/Age/Gender Xavier Bell 83 y.o. male Room/Bed: 042C/042C  Code Status   Code Status: Full Code  Home/SNF/Other Home Patient oriented to: self, place, time, and situation Is this baseline? Yes   Triage Complete: Triage complete  Chief Complaint Stroke (cerebrum) [facility]) [I63.9]  Triage Note Pt BIB [facility] from home due to having difficulty speaking around 0830 this morning per family.  Symptoms had resolved by time EMS showed up at home.  Pt does have cardiac history.  Family gave 2 aspirin at home at 0820 this morning. 20 g right hand.   VS BP 168/84, SpO2 95%RA CBG 172   Allergies Allergies  Allergen Reactions   Niacin And Related Other (See Comments)    Stomach Pain and flushing     Level of Care/Admitting Diagnosis ED Disposition     ED Disposition  Admit   Condition  --   Comment  Hospital Area: [facility] [[id number]]  Level of Care: Telemetry Medical [104]  May admit patient to [facility] or [facility] if equivalent level of care is available:: No  Covid Evaluation: Asymptomatic - no recent exposure (last 10 days) testing not required  Diagnosis: Stroke (cerebrum) [facility]) [[id number]]  Admitting Physician: Nolberto Hanlon [[id number]]  Attending Physician: Nolberto Hanlon [[id number]]  Certification:: I certify this patient will need inpatient services for at least 2 midnights  Expected Medical Readiness: 03/30/2024          B Medical/Surgery History Past Medical History:  Diagnosis Date   Atrial flutter (HCC)    s/p cardioversion 02/08/12; ablation by Dr Ladona Ridgel [phone]   Chronic anticoagulation    Coronary artery disease    s/p remote MI in 1994 with multiple PCI and subsequent CABG x 5 in 1996   Dyslipidemia    Hypertension    MI (myocardial infarction) (HCC) 1995   Normal nuclear stress test 2006   Pseudoaphakia    both eyes   Retinal detachment     Past Surgical History:  Procedure Laterality Date   ATRIAL FLUTTER ABLATION N/A 12/08/2014   CTI by Dr Ladona Ridgel   CARDIAC CATHETERIZATION  1996   CARDIOVERSION  02/08/2012   Procedure: CARDIOVERSION;  Surgeon: Elyn Aquas., MD;  Location: [facility] OR;  Service: Cardiovascular;  Laterality: N/A;   CORONARY ARTERY BYPASS GRAFT  1996   LIMA to LAD, SVG to RV marginal & PD, SVG  to DX and SVG to Ramus   ORIF ANKLE FRACTURE Left 12/10/2019   Procedure: Open Reduction Internal fixation (ORIF) Left Ankle Fracture;  Surgeon: Toni Arthurs, MD;  Location: [facility];  Service: Orthopedics;  Laterality: Left;   TONSILLECTOMY AND ADENOIDECTOMY       A IV Location/Drains/Wounds Patient Lines/Drains/Airways Status     Active Line/Drains/Airways     Name Placement date Placement time Site Days   Peripheral IV 03/28/24 20 G Left;Posterior Forearm 03/28/24  0943  Forearm  less than 1            Intake/Output Last 24 hours No intake or output data in the 24 hours ending 03/28/24 1929  Labs/Imaging Results for orders placed or performed during the hospital encounter of 03/28/24 (from the past 48 hours)  CBG monitoring, ED     Status: Abnormal   Collection Time: 03/28/24  9:42 AM  Result Value Ref Range   Glucose-Capillary 120 (H) 70 - 99 mg/dL  Comment: Glucose reference range applies only to samples taken after fasting for at least 8 hours.  Comprehensive metabolic panel     Status: Abnormal   Collection Time: 03/28/24  9:44 AM  Result Value Ref Range   Sodium 137 135 - 145 mmol/L   Potassium 3.6 3.5 - 5.1 mmol/L   Chloride 104 98 - 111 mmol/L   CO2 20 (L) 22 - 32 mmol/L   Glucose, Bld 113 (H) 70 - 99 mg/dL    Comment: Glucose reference range applies only to samples taken after fasting for at least 8 hours.   BUN 33 (H) 8 - 23 mg/dL   Creatinine, Ser 0.98 (H) 0.61 - 1.24 mg/dL   Calcium 9.6 8.9 - 11.9 mg/dL   Total Protein 6.4 (L) 6.5 - 8.1 g/dL   Albumin 3.8 3.5 -  5.0 g/dL   AST 39 15 - 41 U/L   ALT 26 0 - 44 U/L   Alkaline Phosphatase 67 38 - 126 U/L   Total Bilirubin 1.0 0.0 - 1.2 mg/dL   GFR, Estimated 51 (L) >60 mL/min    Comment: (NOTE) Calculated using the CKD-EPI Creatinine Equation (2021)    Anion gap 13 5 - 15    Comment: Performed at [facility], [address], [place], [place] [address]  CBC     Status: Abnormal   Collection Time: 03/28/24  9:44 AM  Result Value Ref Range   WBC 6.2 4.0 - 10.5 K/uL   RBC 3.86 (L) 4.22 - 5.81 MIL/uL   Hemoglobin 12.3 (L) 13.0 - 17.0 g/dL   HCT 95.6 (L) 21.3 - 08.6 %   MCV 96.6 80.0 - 100.0 fL   MCH 31.9 26.0 - 34.0 pg   MCHC 33.0 30.0 - 36.0 g/dL   RDW 57.8 46.9 - 62.9 %   Platelets 295 150 - 400 K/uL   nRBC 0.0 0.0 - 0.2 %    Comment: Performed at [facility], [address], [place], [place] [address]  Ethanol     Status: None   Collection Time: 03/28/24  9:44 AM  Result Value Ref Range   Alcohol, Ethyl (B) <10 <10 mg/dL    Comment: (NOTE) Lowest detectable limit for serum alcohol is 10 mg/dL.  For medical purposes only. Performed at [facility], [address], [place], [place] [address]   Protime-INR     Status: None   Collection Time: 03/28/24  9:44 AM  Result Value Ref Range   Prothrombin Time 15.1 11.4 - 15.2 seconds   INR 1.2 0.8 - 1.2    Comment: (NOTE) INR goal varies based on device and disease states. Performed at [facility], [address], [place], [place] [address]   APTT     Status: None   Collection Time: 03/28/24  9:44 AM  Result Value Ref Range   aPTT 29 24 - 36 seconds    Comment: Performed at [facility], [address], [place], [place] [address]  Differential     Status: None   Collection Time: 03/28/24  9:44 AM  Result Value Ref Range   Neutrophils Relative % 68 %   Neutro Abs 4.3 1.7 - 7.7 K/uL   Lymphocytes Relative 18 %   Lymphs Abs 1.1 0.7 - 4.0 K/uL   Monocytes Relative 12 %   Monocytes Absolute 0.7 0.1 - 1.0  K/uL   Eosinophils Relative 0 %   Eosinophils Absolute 0.0  0.0 - 0.5 K/uL   Basophils Relative 1 %   Basophils Absolute 0.0 0.0 - 0.1 K/uL   Immature Granulocytes 1 %   Abs Immature Granulocytes 0.04 0.00 - 0.07 K/uL    Comment: Performed at [facility], [address], [place], [place] [address]  Troponin I (High Sensitivity)     Status: Abnormal   Collection Time: 03/28/24  9:44 AM  Result Value Ref Range   Troponin I (High Sensitivity) 32 (H) <18 ng/L    Comment: (NOTE) Elevated high sensitivity troponin I (hsTnI) values and significant  changes across serial measurements may suggest ACS but many other  chronic and acute conditions are known to elevate hsTnI results.  Refer to the "Links" section for chest pain algorithms and additional  guidance. Performed at [facility], [address] [place], [place] [address]   Phosphorus     Status: None   Collection Time: 03/28/24  9:44 AM  Result Value Ref Range   Phosphorus 3.3 2.5 - 4.6 mg/dL    Comment: Performed at [facility], [address] [place], [place] [address]  I-stat chem 8, ED     Status: Abnormal   Collection Time: 03/28/24 10:07 AM  Result Value Ref Range   Sodium 140 135 - 145 mmol/L   Potassium 4.0 3.5 - 5.1 mmol/L   Chloride 106 98 - 111 mmol/L   BUN 32 (H) 8 - 23 mg/dL   Creatinine, Ser 8.29 (H) 0.61 - 1.24 mg/dL   Glucose, Bld 562 (H) 70 - 99 mg/dL    Comment: Glucose reference range applies only to samples taken after fasting for at least 8 hours.   Calcium, Ion 1.22 1.15 - 1.40 mmol/L   TCO2 21 (L) 22 - 32 mmol/L   Hemoglobin 11.9 (L) 13.0 - 17.0 g/dL   HCT 13.0 (L) 86.5 - 78.4 %  Hemoglobin A1c     Status: Abnormal   Collection Time: 03/28/24 10:51 AM  Result Value Ref Range   Hgb A1c MFr Bld 5.8 (H) 4.8 - 5.6 %    Comment: (NOTE) Pre diabetes:          5.7%-6.4%  Diabetes:              >6.4%  Glycemic control for   <7.0% adults with diabetes    Mean Plasma Glucose 119.76  mg/dL    Comment: Performed at [facility], [address] [place], [place] [address]  Urine rapid drug screen (hosp performed)     Status: None   Collection Time: 03/28/24 12:48 PM  Result Value Ref Range   Opiates NONE DETECTED NONE DETECTED   Cocaine NONE DETECTED NONE DETECTED   Benzodiazepines NONE DETECTED NONE DETECTED   Amphetamines NONE DETECTED NONE DETECTED   Tetrahydrocannabinol NONE DETECTED NONE DETECTED   Barbiturates NONE DETECTED NONE DETECTED    Comment: (NOTE) DRUG SCREEN FOR MEDICAL PURPOSES ONLY.  IF CONFIRMATION IS NEEDED FOR ANY PURPOSE, NOTIFY LAB WITHIN 5 DAYS.  LOWEST DETECTABLE LIMITS FOR URINE DRUG SCREEN Drug Class                     Cutoff (ng/mL) Amphetamine and metabolites    1000 Barbiturate and metabolites    200 Benzodiazepine                 200 Opiates and metabolites        300 Cocaine and metabolites  300 THC                            50 Performed at [facility], [address] [place], [place] [address]   Urinalysis, Routine w reflex microscopic -Urine, Unspecified Source     Status: Abnormal   Collection Time: 03/28/24 12:48 PM  Result Value Ref Range   Color, Urine YELLOW YELLOW   APPearance CLEAR CLEAR   Specific Gravity, Urine 1.019 1.005 - 1.030   pH 7.0 5.0 - 8.0   Glucose, UA NEGATIVE NEGATIVE mg/dL   Hgb urine dipstick SMALL (A) NEGATIVE   Bilirubin Urine NEGATIVE NEGATIVE   Ketones, ur NEGATIVE NEGATIVE mg/dL   Protein, ur NEGATIVE NEGATIVE mg/dL   Nitrite NEGATIVE NEGATIVE   Leukocytes,Ua NEGATIVE NEGATIVE   RBC / HPF 0-5 0 - 5 RBC/hpf   WBC, UA 0-5 0 - 5 WBC/hpf   Bacteria, UA NONE SEEN NONE SEEN   Squamous Epithelial / HPF 0-5 0 - 5 /HPF    Comment: Performed at [facility], [address] [place], [place] [address]   MR BRAIN WO CONTRAST Result Date: 03/28/2024 CLINICAL DATA:  Stroke, follow-up. EXAM: MRI HEAD WITHOUT CONTRAST TECHNIQUE: Multiplanar, multiecho pulse sequences of the  brain and surrounding structures were obtained without intravenous contrast. COMPARISON:  None Available. FINDINGS: Brain: Restricted diffusion is present along the posterior aspect of the left sylvian fissure extending to the atrium of the left lateral ventricle. A 6 mm cortical infarct present in the posterior left temporal lobe. A 5 mm cortical infarct is present the anterior inferior left frontal lobe. T2 and FLAIR hyperintensities are associated with several of the infarcts. The changes are more subtle in the posterior left sylvian fissure infarct. No acute hemorrhage or mass lesion is present. The ventricles are of normal size. No significant extraaxial fluid collection is present. The brainstem and cerebellum are within normal limits. Midline structures are within normal limits. Vascular: Flow is present in the major intracranial arteries. Skull and upper cervical spine: The craniocervical junction is normal. Upper cervical spine is within normal limits. Marrow signal is unremarkable. Sinuses/Orbits: The paranasal sinuses and mastoid air cells are clear. Bilateral lens replacements are noted. Globes and orbits are otherwise unremarkable. IMPRESSION: 1. Restricted diffusion along the posterior aspect of the left Sylvian fissure extending to the atrium of the left lateral ventricle consistent with acute infarct. 2. Additional small infarcts involving the posterior left temporal lobe, left parietal lobe and anterior inferior left frontal lobe. 3. No acute hemorrhage or mass lesion. Electronically Signed   By: Marin Roberts M.D.   On: 03/28/2024 13:03   CT Angio Head Neck W WO CM (CODE STROKE) Result Date: 03/28/2024 CLINICAL DATA:  Neuro deficit, acute, stroke suspected.  Aphasia. EXAM: CT ANGIOGRAPHY HEAD AND NECK WITH AND WITHOUT CONTRAST TECHNIQUE: Multidetector CT imaging of the head and neck was performed using the standard protocol during bolus administration of intravenous contrast. Multiplanar  CT image reconstructions and MIPs were obtained to evaluate the vascular anatomy. Carotid stenosis measurements (when applicable) are obtained utilizing NASCET criteria, using the distal internal carotid diameter as the denominator. RADIATION DOSE REDUCTION: This exam was performed according to the departmental dose-optimization program which includes automated exposure control, adjustment of the mA and/or kV according to patient size and/or use of iterative reconstruction technique. CONTRAST:  75mL OMNIPAQUE IOHEXOL 350 MG/ML SOLN COMPARISON:  CT head without contrast 03/28/2024. FINDINGS:  CTA NECK FINDINGS Aortic arch: Atherosclerotic calcifications are present at the aortic arch. Calcifications are present at the origin of the left subclavian artery and left common carotid artery without significant stenosis. No aneurysm is present. Right carotid system: The right common carotid artery is within normal limits. Dense calcifications are present at the proximal right ICA lumen is narrowed to 2 mm. This compares to more normal distal lumen of 5 mm. Left carotid system: The left common carotid artery is within normal limits. Dense calcifications are present at the left carotid bifurcation without a significant stenosis relative to the more distal vessel. The cervical left ICA is otherwise within normal limits. Vertebral arteries: The right vertebral artery is dominant vessel. Dense calcifications are present at the origins of both vertebral arteries. 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 The lumen is narrowed to 1.4 mm. Severe left scratched at severe stenosis is present at the origin of the left vertebral artery. No tandem stenoses are present in either vertebral artery in the neck. Skeleton: Multilevel degenerative changes are present within the cervical spine. Grade 1 degenerative anterolisthesis is present C4-5. Chronic endplate degenerative changes present C5-6 6 and C6-7. No focal  osseous lesions are present. Other neck: The soft tissues of the neck are otherwise unremarkable. Salivary glands are within normal limits. Thyroid is normal. No significant adenopathy is present. No focal mucosal or submucosal lesions are present. Upper chest: The lung apices are clear. The thoracic inlet is within normal limits. Review of the MIP images confirms the above findings CTA HEAD FINDINGS Anterior circulation: Atherosclerotic calcifications are present within the cavernous internal carotid arteries without significant stenosis through the ICA termini. The A1 and M1 segments are normal. The anterior communicating artery is patent. The MCA bifurcations are within normal limits. The ACA and MCA branch vessels are normal. No aneurysm is present. Posterior circulation: The right vertebral artery is dominant. Atherosclerotic calcifications are present at the dural margin of the right vertebral artery without a significant stenosis relative to the more distal vessel. The PICA origins are visualized and normal. Vertebrobasilar junction and basilar artery normal. The superior cerebellar arteries are patent. The right posterior cerebral artery originates from basilar tip. The left posterior cerebral artery is of fetal type. Small left P1 segment is noted. The PCA branch vessels are within normal limits bilaterally. No aneurysm is present. Venous sinuses: The dural sinuses are patent. The straight sinus and deep cerebral veins are intact. Cortical veins are within normal limits. No significant vascular malformation is evident. Anatomic variants: Fetal type left posterior cerebral artery. Review of the MIP images confirms the above findings IMPRESSION: 1. 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 2. 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 3. Severe stenosis at the origin of the left vertebral artery. 4. Dense calcifications at the left carotid bifurcation without a significant  stenosis relative to the more distal vessel. 5. Atherosclerotic calcifications within the cavernous internal carotid arteries without significant stenosis through the ICA termini. 6. No significant proximal stenosis, aneurysm, or branch vessel occlusion within the Circle of Willis. 7. Multilevel degenerative changes of the cervical spine. Electronically Signed   By: Marin Roberts M.D.   On: 03/28/2024 10:42   CT HEAD CODE STROKE WO CONTRAST Result Date: 03/28/2024 CLINICAL DATA:  Code stroke.  Aphasia. EXAM: CT HEAD WITHOUT CONTRAST TECHNIQUE: Contiguous axial images were obtained from the base of the skull through the vertex without intravenous contrast. RADIATION DOSE REDUCTION:  This exam was performed according to the departmental dose-optimization program which includes automated exposure control, adjustment of the mA and/or kV according to patient size and/or use of iterative reconstruction technique. COMPARISON:  No acute infarct, hemorrhage, FINDINGS: Brain: No acute infarct, hemorrhage, or mass lesion is present. Mild atrophy and white matter changes are noted bilaterally. Deep brain nuclei are within normal limits. The ventricles are of normal size. No significant extraaxial fluid collection is present. The brainstem and cerebellum are within normal limits. Midline structures are within normal limits. Vascular: Atherosclerotic calcifications are present within the cavernous internal carotid arteries bilaterally. Calcifications are present at the dural margin both vertebral arteries, right greater than left. No hyperdense vessel is present. Skull: Calvarium is intact. No focal lytic or blastic lesions are present. No significant extracranial soft tissue lesion is present. Sinuses/Orbits: Chronic right maxillary sinusitis is present. Diffuse wall thickening is present. Less prominent wall thickening is present on the left without active sinus disease. Mild mucosal thickening is present in the ethmoid  air cells bilaterally. ASPECTS [facility] Stroke Program Early CT Score) - Ganglionic level infarction (caudate, lentiform nuclei, internal capsule, insula, M1-M3 cortex): 7/7 - Supraganglionic infarction (M4-M6 cortex): 3/3 Total score (0-10 with 10 being normal): 10/10 IMPRESSION: 1. No acute intracranial abnormality or significant interval change. 2. Aspects is 10/10. 3. Mild atrophy and white matter disease likely reflects the sequela of chronic microvascular ischemia. 4. Chronic right maxillary sinusitis. The above was relayed via text pager to Dr. Tollie Eth on 03/28/2024 at 10:22 . Electronically Signed   By: Marin Roberts M.D.   On: 03/28/2024 10:23    Pending Labs Unresulted Labs (From admission, onward)     Start     Ordered   04/04/24 0500  Creatinine, serum  (enoxaparin (LOVENOX)    CrCl >/= 30 ml/min)  Weekly,   R     Comments: while on enoxaparin therapy    03/28/24 1331   03/29/24 0500  Lipid panel  (Labs)  Tomorrow morning,   R       Comments: Fasting    03/28/24 1050   03/29/24 0500  CBC  (Labs)  Tomorrow morning,   R        03/28/24 1331            Vitals/Pain Today's Vitals   03/28/24 1700 03/28/24 1800 03/28/24 1802 03/28/24 1928  BP: (!) 130/58 129/70    Pulse: (!) 59 (!) 58    Resp: 15 (!) 21    Temp:   98.1 F (36.7 C)   TempSrc:   Oral   SpO2: 97% 97%    PainSc:    0-No pain    Isolation Precautions No active isolations  Medications Medications  clopidogrel (PLAVIX) tablet 300 mg (300 mg Oral Given 03/28/24 1142)    Followed by  clopidogrel (PLAVIX) tablet 75 mg (has no administration in time range)  aspirin chewable tablet 81 mg (81 mg Oral Not Given 03/28/24 1142)   stroke: early stages of recovery book (has no administration in time range)  acetaminophen (TYLENOL) tablet 650 mg (has no administration in time range)    Or  acetaminophen (TYLENOL) 160 MG/5ML solution 650 mg (has no administration in time range)    Or  acetaminophen  (TYLENOL) suppository 650 mg (has no administration in time range)  senna-docusate (Senokot-S) tablet 1 tablet (has no administration in time range)  enoxaparin (LOVENOX) injection 40 mg (40 mg Subcutaneous Given 03/28/24 1444)  atorvastatin (LIPITOR) tablet  40 mg (40 mg Oral Not Given 03/28/24 1442)  fenofibrate tablet 160 mg (160 mg Oral Not Given 03/28/24 1443)  brimonidine (ALPHAGAN) 0.2 % ophthalmic solution 1 drop (1 drop Both Eyes Not Given 03/28/24 1443)  dorzolamide-timolol (COSOPT) 2-0.5 % ophthalmic solution 2 drop (2 drops Both Eyes Not Given 03/28/24 1443)  latanoprost (XALATAN) 0.005 % ophthalmic solution 1 drop (has no administration in time range)  iohexol (OMNIPAQUE) 350 MG/ML injection 75 mL (75 mLs Intravenous Contrast Given 03/28/24 1030)    Mobility This nurse has not witnessed patient attempt to ambulate     Focused Assessments Neuro Assessment Handoff:  Swallow screen pass? Yes  Cardiac Rhythm: Heart block, Bundle branch block NIH Stroke Scale  Dizziness Present: No Headache Present: No Interval: Shift assessment Level of Consciousness (1a.)   : Alert, keenly responsive LOC Questions (1b. )   : Answers both questions correctly LOC Commands (1c. )   : Performs both tasks correctly Best Gaze (2. )  : Normal Visual (3. )  : No visual loss Facial Palsy (4. )    : Normal symmetrical movements Motor Arm, Left (5a. )   : No drift Motor Arm, Right (5b. ) : No drift Motor Leg, Left (6a. )  : No drift Motor Leg, Right (6b. ) : No drift Limb Ataxia (7. ): Absent Sensory (8. )  : Normal, no sensory loss Best Language (9. )  : Mild-to-moderate aphasia Dysarthria (10. ): Normal Extinction/Inattention (11.)   : No Abnormality Complete NIHSS TOTAL: 1 Last date known well: 03/27/24 Last time known well: 1500 Neuro Assessment: Within Defined Limits Neuro Checks:   Initial (03/28/24 1000)  Has TPA been given? No If patient is a Neuro Trauma and patient is going to OR before  floor call report to 4N Charge nurse: [phone] or [phone]   R Recommendations: See Admitting Provider Note  Report given to:   Additional Notes:

## 2024-03-28 NOTE — H&P (Addendum)
 History and Physical    Patient: Xavier Bell RUE:[id number] DOB: Oct 16, 1941 DOA: 03/28/2024 DOS: the patient was seen and examined on 03/28/2024 PCP: Georgina Quint, MD  Patient coming from: Home  Chief Complaint: No chief complaint on file.  HPI: Xavier Bell is a 83 y.o. male with medical history significant of medical issues as listed below.  Patient was in his usual state of health until he went to bed last evening.  Further patient seems to have gotten up several times overnight at different times approximated at 2 am and 5 AM and had no trouble walking at that time.  Patient subsequently woke up at approximately 8 AM and noticed marked right lower extremity weakness with marked difficulty maintaining gait.  However denies any fall or trauma.  Subsequently patient's wife called the patient over phone at approximately 8:30 AM.  Wife noted that the patient was having difficulty speaking in terms of finding words.  Therefore EMS was called.  Patient is brought to [facility], ER where patient's aphasia was corroborated by ER attending.  Patient is s/p code stroke protocol.  Patient's other family members have since arrived.  Patient aphasia has markedly improved.  Although patient occasionally still has word finding difficulty when talking.  Patient also reports that his right lower extremity weakness has improved.  See workup below.  Medical evaluation is sought Review of Systems: As mentioned in the history of present illness. All other systems reviewed and are negative. Past Medical History:  Diagnosis Date   Atrial flutter (HCC)    s/p cardioversion 02/08/12; ablation by Dr Ladona Ridgel [phone]   Chronic anticoagulation    Coronary artery disease    s/p remote MI in 1994 with multiple PCI and subsequent CABG x 5 in 1996   Dyslipidemia    Hypertension    MI (myocardial infarction) (HCC) 1995   Normal nuclear stress test 2006   Pseudoaphakia    both eyes   Retinal  detachment    Past Surgical History:  Procedure Laterality Date   ATRIAL FLUTTER ABLATION N/A 12/08/2014   CTI by Dr Ladona Ridgel   CARDIAC CATHETERIZATION  1996   CARDIOVERSION  02/08/2012   Procedure: CARDIOVERSION;  Surgeon: Elyn Aquas., MD;  Location: [facility] OR;  Service: Cardiovascular;  Laterality: N/A;   CORONARY ARTERY BYPASS GRAFT  1996   LIMA to LAD, SVG to RV marginal & PD, SVG  to DX and SVG to Ramus   ORIF ANKLE FRACTURE Left 12/10/2019   Procedure: Open Reduction Internal fixation (ORIF) Left Ankle Fracture;  Surgeon: Toni Arthurs, MD;  Location: [facility];  Service: Orthopedics;  Laterality: Left;   TONSILLECTOMY AND ADENOIDECTOMY     Social History:  reports that he has never smoked. He has never used smokeless tobacco. He reports that he does not drink alcohol and does not use drugs.  Allergies  Allergen Reactions   Niacin And Related Other (See Comments)    Stomach Pain and flushing     Family History  Problem Relation Age of Onset   Heart disease Mother    Heart disease Father    Coronary artery disease Other     Prior to Admission medications   Medication Sig Start Date End Date Taking? Authorizing Provider  amLODipine (NORVASC) 10 MG tablet Take 1 tablet (10 mg total) by mouth daily. 11/14/23   [name], Deloris Ping, MD  aspirin 81 MG tablet Take 81 mg by mouth daily.    [provider]  atorvastatin (LIPITOR) 40 MG tablet TAKE 1 TABLET EVERY DAY 10/25/23   [name], Deloris Ping, MD  brimonidine (ALPHAGAN) 0.2 % ophthalmic solution Place 1 drop into both eyes in the morning and at bedtime. 07/17/23   [provider]  carvedilol (COREG) 12.5 MG tablet TAKE 1 TABLET TWICE DAILY 11/18/23   [name], Deloris Ping, MD  chlorthalidone (HYGROTON) 25 MG tablet TAKE 1/2 TABLET EVERY DAY 12/31/23   [name], Deloris Ping, MD  Cholecalciferol (VITAMIN D) 2000 units tablet Take 2,000 Units by mouth daily.    [provider]  dorzolamide-timolol (COSOPT)  22.3-6.8 MG/ML ophthalmic solution Place 2 drops into both eyes 2 (two) times daily.    [provider]  fenofibrate (TRICOR) 145 MG tablet TAKE 1 TABLET EVERY DAY 12/27/23   [name], Deloris Ping, MD  latanoprost (XALATAN) 0.005 % ophthalmic solution Place 1 drop into both eyes at bedtime.  07/15/17   [provider]  Multiple Vitamin (MULTIVITAMIN) tablet Take 1 tablet by mouth daily.    [provider]  Omega-3 Fatty Acids (OMEGA 3 PO) Take 1 capsule by mouth daily.    [provider]  potassium chloride SA (KLOR-CON M) 20 MEQ tablet Take 1 tablet (20 mEq total) by mouth daily. 02/11/23   [name], Deloris Ping, MD  valsartan (DIOVAN) 320 MG tablet TAKE 1 TABLET EVERY DAY 10/18/23   [name], Deloris Ping, MD  Zinc 50 MG CAPS Take 50 mg by mouth daily.    [provider]    Physical Exam: Vitals:   03/28/24 0941 03/28/24 1015 03/28/24 1030  BP: (!) 151/77 (!) 185/82 (!) 148/85  Pulse: 68  68  Resp: 18 18 13   Temp: 97.7 F (36.5 C)    TempSrc: Oral    SpO2: 100% 99% 91%   General: Patient is alert and awake, fully oriented.  And for the most part there is no apparent aphasia, although after more than 5 minutes of talking there was 1 instance where patient had to pause then could not find a word when he was trying to explain his ER course to me. Respiratory exam: Bilateral intravesicular Cardiovascular exam S1-S2 normal Abdomen all quadrants soft nontender Extremities warm without edema No focal motor deficit is appreciated. Data Reviewed:  Labs on Admission:  Results for orders placed or performed during the hospital encounter of 03/28/24 (from the past 24 hours)  CBG monitoring, ED     Status: Abnormal   Collection Time: 03/28/24  9:42 AM  Result Value Ref Range   Glucose-Capillary 120 (H) 70 - 99 mg/dL  Comprehensive metabolic panel     Status: Abnormal   Collection Time: 03/28/24  9:44 AM  Result Value Ref Range   Sodium 137 135 - 145 mmol/L    Potassium 3.6 3.5 - 5.1 mmol/L   Chloride 104 98 - 111 mmol/L   CO2 20 (L) 22 - 32 mmol/L   Glucose, Bld 113 (H) 70 - 99 mg/dL   BUN 33 (H) 8 - 23 mg/dL   Creatinine, Ser 5.78 (H) 0.61 - 1.24 mg/dL   Calcium 9.6 8.9 - 46.9 mg/dL   Total Protein 6.4 (L) 6.5 - 8.1 g/dL   Albumin 3.8 3.5 - 5.0 g/dL   AST 39 15 - 41 U/L   ALT 26 0 - 44 U/L   Alkaline Phosphatase 67 38 - 126 U/L   Total Bilirubin 1.0 0.0 - 1.2 mg/dL   GFR, Estimated 51 (L) >60 mL/min   Anion  gap 13 5 - 15  CBC     Status: Abnormal   Collection Time: 03/28/24  9:44 AM  Result Value Ref Range   WBC 6.2 4.0 - 10.5 K/uL   RBC 3.86 (L) 4.22 - 5.81 MIL/uL   Hemoglobin 12.3 (L) 13.0 - 17.0 g/dL   HCT 29.5 (L) 62.1 - 30.8 %   MCV 96.6 80.0 - 100.0 fL   MCH 31.9 26.0 - 34.0 pg   MCHC 33.0 30.0 - 36.0 g/dL   RDW 65.7 84.6 - 96.2 %   Platelets 295 150 - 400 K/uL   nRBC 0.0 0.0 - 0.2 %  Ethanol     Status: None   Collection Time: 03/28/24  9:44 AM  Result Value Ref Range   Alcohol, Ethyl (B) <10 <10 mg/dL  Protime-INR     Status: None   Collection Time: 03/28/24  9:44 AM  Result Value Ref Range   Prothrombin Time 15.1 11.4 - 15.2 seconds   INR 1.2 0.8 - 1.2  APTT     Status: None   Collection Time: 03/28/24  9:44 AM  Result Value Ref Range   aPTT 29 24 - 36 seconds  Differential     Status: None   Collection Time: 03/28/24  9:44 AM  Result Value Ref Range   Neutrophils Relative % 68 %   Neutro Abs 4.3 1.7 - 7.7 K/uL   Lymphocytes Relative 18 %   Lymphs Abs 1.1 0.7 - 4.0 K/uL   Monocytes Relative 12 %   Monocytes Absolute 0.7 0.1 - 1.0 K/uL   Eosinophils Relative 0 %   Eosinophils Absolute 0.0 0.0 - 0.5 K/uL   Basophils Relative 1 %   Basophils Absolute 0.0 0.0 - 0.1 K/uL   Immature Granulocytes 1 %   Abs Immature Granulocytes 0.04 0.00 - 0.07 K/uL  I-stat chem 8, ED     Status: Abnormal   Collection Time: 03/28/24 10:07 AM  Result Value Ref Range   Sodium 140 135 - 145 mmol/L   Potassium 4.0 3.5 - 5.1  mmol/L   Chloride 106 98 - 111 mmol/L   BUN 32 (H) 8 - 23 mg/dL   Creatinine, Ser 9.52 (H) 0.61 - 1.24 mg/dL   Glucose, Bld 841 (H) 70 - 99 mg/dL   Calcium, Ion 3.24 4.01 - 1.40 mmol/L   TCO2 21 (L) 22 - 32 mmol/L   Hemoglobin 11.9 (L) 13.0 - 17.0 g/dL   HCT 02.7 (L) 25.3 - 66.4 %  Urine rapid drug screen (hosp performed)     Status: None   Collection Time: 03/28/24 12:48 PM  Result Value Ref Range   Opiates NONE DETECTED NONE DETECTED   Cocaine NONE DETECTED NONE DETECTED   Benzodiazepines NONE DETECTED NONE DETECTED   Amphetamines NONE DETECTED NONE DETECTED   Tetrahydrocannabinol NONE DETECTED NONE DETECTED   Barbiturates NONE DETECTED NONE DETECTED  Urinalysis, Routine w reflex microscopic -Urine, Unspecified Source     Status: Abnormal   Collection Time: 03/28/24 12:48 PM  Result Value Ref Range   Color, Urine YELLOW YELLOW   APPearance CLEAR CLEAR   Specific Gravity, Urine 1.019 1.005 - 1.030   pH 7.0 5.0 - 8.0   Glucose, UA NEGATIVE NEGATIVE mg/dL   Hgb urine dipstick SMALL (A) NEGATIVE   Bilirubin Urine NEGATIVE NEGATIVE   Ketones, ur NEGATIVE NEGATIVE mg/dL   Protein, ur NEGATIVE NEGATIVE mg/dL   Nitrite NEGATIVE NEGATIVE   Leukocytes,Ua NEGATIVE NEGATIVE   RBC /  HPF 0-5 0 - 5 RBC/hpf   WBC, UA 0-5 0 - 5 WBC/hpf   Bacteria, UA NONE SEEN NONE SEEN   Squamous Epithelial / HPF 0-5 0 - 5 /HPF   Basic Metabolic Panel: Recent Labs  Lab 03/28/24 0944 03/28/24 1007  NA 137 140  K 3.6 4.0  CL 104 106  CO2 20*  --   GLUCOSE 113* 111*  BUN 33* 32*  CREATININE 1.37* 1.40*  CALCIUM 9.6  --    Liver Function Tests: Recent Labs  Lab 03/28/24 0944  AST 39  ALT 26  ALKPHOS 67  BILITOT 1.0  PROT 6.4*  ALBUMIN 3.8   No results for input(s): "LIPASE", "AMYLASE" in the last 168 hours. No results for input(s): "AMMONIA" in the last 168 hours. CBC: Recent Labs  Lab 03/28/24 0944 03/28/24 1007  WBC 6.2  --   NEUTROABS 4.3  --   HGB 12.3* 11.9*  HCT 37.3* 35.0*   MCV 96.6  --   PLT 295  --    Cardiac Enzymes: No results for input(s): "CKTOTAL", "CKMB", "CKMBINDEX", "TROPONINIHS" in the last 168 hours.  BNP (last 3 results) No results for input(s): "PROBNP" in the last 8760 hours. CBG: Recent Labs  Lab 03/28/24 0942  GLUCAP 120*    Radiological Exams on Admission:  MR BRAIN WO CONTRAST Result Date: 03/28/2024 CLINICAL DATA:  Stroke, follow-up. EXAM: MRI HEAD WITHOUT CONTRAST TECHNIQUE: Multiplanar, multiecho pulse sequences of the brain and surrounding structures were obtained without intravenous contrast. COMPARISON:  None Available. FINDINGS: Brain: Restricted diffusion is present along the posterior aspect of the left sylvian fissure extending to the atrium of the left lateral ventricle. A 6 mm cortical infarct present in the posterior left temporal lobe. A 5 mm cortical infarct is present the anterior inferior left frontal lobe. T2 and FLAIR hyperintensities are associated with several of the infarcts. The changes are more subtle in the posterior left sylvian fissure infarct. No acute hemorrhage or mass lesion is present. The ventricles are of normal size. No significant extraaxial fluid collection is present. The brainstem and cerebellum are within normal limits. Midline structures are within normal limits. Vascular: Flow is present in the major intracranial arteries. Skull and upper cervical spine: The craniocervical junction is normal. Upper cervical spine is within normal limits. Marrow signal is unremarkable. Sinuses/Orbits: The paranasal sinuses and mastoid air cells are clear. Bilateral lens replacements are noted. Globes and orbits are otherwise unremarkable. IMPRESSION: 1. Restricted diffusion along the posterior aspect of the left Sylvian fissure extending to the atrium of the left lateral ventricle consistent with acute infarct. 2. Additional small infarcts involving the posterior left temporal lobe, left parietal lobe and anterior inferior  left frontal lobe. 3. No acute hemorrhage or mass lesion. Electronically Signed   By: Marin Roberts M.D.   On: 03/28/2024 13:03   CT Angio Head Neck W WO CM (CODE STROKE) Result Date: 03/28/2024 CLINICAL DATA:  Neuro deficit, acute, stroke suspected.  Aphasia. EXAM: CT ANGIOGRAPHY HEAD AND NECK WITH AND WITHOUT CONTRAST TECHNIQUE: Multidetector CT imaging of the head and neck was performed using the standard protocol during bolus administration of intravenous contrast. Multiplanar CT image reconstructions and MIPs were obtained to evaluate the vascular anatomy. Carotid stenosis measurements (when applicable) are obtained utilizing NASCET criteria, using the distal internal carotid diameter as the denominator. RADIATION DOSE REDUCTION: This exam was performed according to the departmental dose-optimization program which includes automated exposure control, adjustment of the mA and/or kV  according to patient size and/or use of iterative reconstruction technique. CONTRAST:  75mL OMNIPAQUE IOHEXOL 350 MG/ML SOLN COMPARISON:  CT head without contrast 03/28/2024. FINDINGS: CTA NECK FINDINGS Aortic arch: Atherosclerotic calcifications are present at the aortic arch. Calcifications are present at the origin of the left subclavian artery and left common carotid artery without significant stenosis. No aneurysm is present. Right carotid system: The right common carotid artery is within normal limits. Dense calcifications are present at the proximal right ICA lumen is narrowed to 2 mm. This compares to more normal distal lumen of 5 mm. Left carotid system: The left common carotid artery is within normal limits. Dense calcifications are present at the left carotid bifurcation without a significant stenosis relative to the more distal vessel. The cervical left ICA is otherwise within normal limits. Vertebral arteries: The right vertebral artery is dominant vessel. Dense calcifications are present at the origins of both  vertebral arteries. 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 The lumen is narrowed to 1.4 mm. Severe left scratched at severe stenosis is present at the origin of the left vertebral artery. No tandem stenoses are present in either vertebral artery in the neck. Skeleton: Multilevel degenerative changes are present within the cervical spine. Grade 1 degenerative anterolisthesis is present C4-5. Chronic endplate degenerative changes present C5-6 6 and C6-7. No focal osseous lesions are present. Other neck: The soft tissues of the neck are otherwise unremarkable. Salivary glands are within normal limits. Thyroid is normal. No significant adenopathy is present. No focal mucosal or submucosal lesions are present. Upper chest: The lung apices are clear. The thoracic inlet is within normal limits. Review of the MIP images confirms the above findings CTA HEAD FINDINGS Anterior circulation: Atherosclerotic calcifications are present within the cavernous internal carotid arteries without significant stenosis through the ICA termini. The A1 and M1 segments are normal. The anterior communicating artery is patent. The MCA bifurcations are within normal limits. The ACA and MCA branch vessels are normal. No aneurysm is present. Posterior circulation: The right vertebral artery is dominant. Atherosclerotic calcifications are present at the dural margin of the right vertebral artery without a significant stenosis relative to the more distal vessel. The PICA origins are visualized and normal. Vertebrobasilar junction and basilar artery normal. The superior cerebellar arteries are patent. The right posterior cerebral artery originates from basilar tip. The left posterior cerebral artery is of fetal type. Small left P1 segment is noted. The PCA branch vessels are within normal limits bilaterally. No aneurysm is present. Venous sinuses: The dural sinuses are patent. The straight sinus and deep cerebral veins  are intact. Cortical veins are within normal limits. No significant vascular malformation is evident. Anatomic variants: Fetal type left posterior cerebral artery. Review of the MIP images confirms the above findings IMPRESSION: 1. 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 2. 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 3. Severe stenosis at the origin of the left vertebral artery. 4. Dense calcifications at the left carotid bifurcation without a significant stenosis relative to the more distal vessel. 5. Atherosclerotic calcifications within the cavernous internal carotid arteries without significant stenosis through the ICA termini. 6. No significant proximal stenosis, aneurysm, or branch vessel occlusion within the Circle of Willis. 7. Multilevel degenerative changes of the cervical spine. Electronically Signed   By: Marin Roberts M.D.   On: 03/28/2024 10:42   CT HEAD CODE STROKE WO CONTRAST Result Date: 03/28/2024 CLINICAL DATA:  Code stroke.  Aphasia.  EXAM: CT HEAD WITHOUT CONTRAST TECHNIQUE: Contiguous axial images were obtained from the base of the skull through the vertex without intravenous contrast. RADIATION DOSE REDUCTION: This exam was performed according to the departmental dose-optimization program which includes automated exposure control, adjustment of the mA and/or kV according to patient size and/or use of iterative reconstruction technique. COMPARISON:  No acute infarct, hemorrhage, FINDINGS: Brain: No acute infarct, hemorrhage, or mass lesion is present. Mild atrophy and white matter changes are noted bilaterally. Deep brain nuclei are within normal limits. The ventricles are of normal size. No significant extraaxial fluid collection is present. The brainstem and cerebellum are within normal limits. Midline structures are within normal limits. Vascular: Atherosclerotic calcifications are present within the cavernous internal carotid arteries bilaterally.  Calcifications are present at the dural margin both vertebral arteries, right greater than left. No hyperdense vessel is present. Skull: Calvarium is intact. No focal lytic or blastic lesions are present. No significant extracranial soft tissue lesion is present. Sinuses/Orbits: Chronic right maxillary sinusitis is present. Diffuse wall thickening is present. Less prominent wall thickening is present on the left without active sinus disease. Mild mucosal thickening is present in the ethmoid air cells bilaterally. ASPECTS [facility] Stroke Program Early CT Score) - Ganglionic level infarction (caudate, lentiform nuclei, internal capsule, insula, M1-M3 cortex): 7/7 - Supraganglionic infarction (M4-M6 cortex): 3/3 Total score (0-10 with 10 being normal): 10/10 IMPRESSION: 1. No acute intracranial abnormality or significant interval change. 2. Aspects is 10/10. 3. Mild atrophy and white matter disease likely reflects the sequela of chronic microvascular ischemia. 4. Chronic right maxillary sinusitis. The above was relayed via text pager to Dr. Tollie Eth on 03/28/2024 at 10:22 . Electronically Signed   By: Marin Roberts M.D.   On: 03/28/2024 10:23    chest X-ray  EKG: Independently reviewed. NSR  No intake/output data recorded. No intake/output data recorded.       Assessment and Plan: * Stroke (cerebrum) (HCC) See MRI report, patient seems to have multifocal infarction of the left side suggestive of embolic phenomenon.  Involving left temporal left parietal and left frontal lobe as as well as the left sylvian fissure.  Patient's aphasia and right lower extremity weakness seem to be markedly improving.  Patient's angiography is not demonstrating more than 70% stenosis of the left carotid artery.  We will follow-up with neurology regarding this, however patient's carotid artery disease does not seem amenable to intervention at this time.  Started on aspirin Plavix I will add statin.  Maintain on  telemetry, echo pending PT OT evaluation.  Hold antihypertensive regimen for 48 hours. Check phos level given calcificatins  Glaucoma Continue with Alphagan, Cosopt, xalatan  Hypertension Hold regimen. Restart in 24-48 hours      Advance Care Planning:   Code Status: Full Code   Consults: neurology seen note thank you.  Family Communication: wife at bedside. Mri and ct scan report shared per request.  Severity of Illness: The appropriate patient status for this patient is INPATIENT. Inpatient status is judged to be reasonable and necessary in order to provide the required intensity of service to ensure the patient's safety. The patient's presenting symptoms, physical exam findings, and initial radiographic and laboratory data in the context of their chronic comorbidities is felt to place them at high risk for further clinical deterioration. Furthermore, it is not anticipated that the patient will be medically stable for discharge from the hospital within 2 midnights of admission.   * I certify that at the  point of admission it is my clinical judgment that the patient will require inpatient hospital care spanning beyond 2 midnights from the point of admission due to high intensity of service, high risk for further deterioration and high frequency of surveillance required.*  Author: Nolberto Hanlon, MD 03/28/2024 1:57 PM  For on call review [email or web address].

## 2024-03-28 NOTE — ED Notes (Addendum)
 Patient having periods of bradycardia while sleeping. HR as low as 27 on tele. Attempting to capture on EKG. HR returns to normal when pt awake, pt remains asymptomatic.

## 2024-03-28 NOTE — Assessment & Plan Note (Addendum)
 See MRI report, patient seems to have multifocal infarction of the left side suggestive of embolic phenomenon.  Involving left temporal left parietal and left frontal lobe as as well as the left sylvian fissure.  Patient's aphasia and right lower extremity weakness seem to be markedly improving.  Patient's angiography is not demonstrating more than 70% stenosis of the left carotid artery.  We will follow-up with neurology regarding this, however patient's carotid artery disease does not seem amenable to intervention at this time.  Started on aspirin Plavix I will add statin.  Maintain on telemetry, echo pending PT OT evaluation.  Hold antihypertensive regimen for 48 hours. Check phos level given calcificatins

## 2024-03-28 NOTE — ED Notes (Signed)
 Pt is in hospital bed and not ED stretcher due to long admit hold time. PT was able to get up and move to the chair with the nurse and family for standby help. PT was able to stand up out the chair and move back to the bed on his own. While standing up pt was able to use the urinal. PT denies any other needs at this time.

## 2024-03-28 NOTE — Assessment & Plan Note (Signed)
 Continue with Alphagan, Cosopt, xalatan

## 2024-03-28 NOTE — Code Documentation (Addendum)
 Stroke Response Nurse Documentation Code Documentation  Sabian BRALLAN DENIO is a 83 y.o. male arriving to [facility]  via Home [facility] on 03/28/2024 with past medical hx of atrial flutter s/p cardioversion in 2013, CAD, HTN, MI. On No antithrombotic. Code stroke was activated by ED.   Patient from home where he was LKW at yesterday (03/27/2024) at 1500 and now complaining of aphasia and right leg weakness. Pt spoke to his wife on the phone yesterday at 1500 and sounded normal. This morning when he spoke to her on the phone at 0730, she noticed he was having trouble getting his words out.   Stroke team at the bedside on patient arrival. Labs drawn and patient cleared for CT by Dr. Fredderick Phenix. Patient to CT with team.   NIHSS 4, see documentation for details and code stroke times. Patient with disoriented to month, right arm ataxia, global aphasia, and right sided sensory neglect on exam.   The following imaging was completed:  CT Head and CTA.   Patient is not a candidate for IV Thrombolytic due to LKW yesterday. Patient is not a candidate for IR due to imaging negative for LVO.   Care Plan: q2h NIHSS and VS.   Process Delays Noted: n/a  Bedside handoff with ED RN Berna Spare.    Ghazal Pevey L Mariadel Mruk  Rapid Response RN

## 2024-03-28 NOTE — ED Triage Notes (Signed)
 Pt BIB [facility] from home due to having difficulty speaking around 0830 this morning per family.  Symptoms had resolved by time EMS showed up at home.  Pt does have cardiac history.  Family gave 2 aspirin at home at 0820 this morning. 20 g right hand.   VS BP 168/84, SpO2 95%RA CBG 172

## 2024-03-28 NOTE — Consult Note (Signed)
 NEUROLOGY CONSULT NOTE   Date of service: March 28, 2024 Patient Name: Xavier Bell MRN:  [id number] DOB:  July 16, 1941 Chief Complaint: "difficulty speaking " Requesting Provider: Rolan Bucco, MD  History of Present Illness  Xavier Bell is a 83 y.o. male with hx of a flutter not on anticoagulation, hypertension, hyperlipidemia, CAD s/p CABG who presents to [facility] ED via EMS for evaluation of difficulty speaking.  Per RN patient has been home alone as [place] is away at the beach and she was talking to patient this morning on the phone and noticed some speaking difficulties and she called EMS.  Code stroke was activated by EDP for aphasia.  NIH 4 Head CT with no acute process, aspects 10.  CTA head and neck with no LVO  LKW: 1500 on 4/4 Modified rankin score: 0-Completely asymptomatic and back to baseline post- stroke IV Thrombolysis:  No outside window EVT:  No LVO  NIHSS components Score: Comment  1a Level of Conscious 0[x]  1[]  2[]  3[]      1b LOC Questions 0[]  1[x]  2[]       1c LOC Commands 0[x]  1[]  2[]       2 Best Gaze 0[x]  1[]  2[]       3 Visual 0[x]  1[]  2[]  3[]      4 Facial Palsy 0[x]  1[]  2[]  3[]      5a Motor Arm - left 0[x]  1[]  2[]  3[]  4[]  UN[]    5b Motor Arm - Right 0[x]  1[]  2[]  3[]  4[]  UN[]    6a Motor Leg - Left 0[x]  1[]  2[]  3[]  4[]  UN[]    6b Motor Leg - Right 0[x]  1[]  2[]  3[]  4[]  UN[]    7 Limb Ataxia 0[]  1[x]  2[]  3[]  UN[]     8 Sensory 0[x]  1[]  2[]  UN[]      9 Best Language 0[]  1[x]  2[]  3[]      10 Dysarthria 0[x]  1[]  2[]  UN[]      11 Extinct. and Inattention 0[]  1[x]  2[]       TOTAL: 4      ROS   Comprehensive ROS performed and pertinent positives documented in HPI   Past History   Past Medical History:  Diagnosis Date   Atrial flutter (HCC)    s/p cardioversion 02/08/12; ablation by Dr Ladona Ridgel [phone]   Chronic anticoagulation    Coronary artery disease    s/p remote MI in 1994 with multiple PCI and subsequent CABG x 5 in 1996   Dyslipidemia     Hypertension    MI (myocardial infarction) (HCC) 1995   Normal nuclear stress test 2006   Pseudoaphakia    both eyes   Retinal detachment     Past Surgical History:  Procedure Laterality Date   ATRIAL FLUTTER ABLATION N/A 12/08/2014   CTI by Dr Ladona Ridgel   CARDIAC CATHETERIZATION  1996   CARDIOVERSION  02/08/2012   Procedure: CARDIOVERSION;  Surgeon: Elyn Aquas., MD;  Location: [facility] OR;  Service: Cardiovascular;  Laterality: N/A;   CORONARY ARTERY BYPASS GRAFT  1996   LIMA to LAD, SVG to RV marginal & PD, SVG  to DX and SVG to Ramus   ORIF ANKLE FRACTURE Left 12/10/2019   Procedure: Open Reduction Internal fixation (ORIF) Left Ankle Fracture;  Surgeon: Toni Arthurs, MD;  Location: [facility];  Service: Orthopedics;  Laterality: Left;   TONSILLECTOMY AND ADENOIDECTOMY      Family History: Family History  Problem Relation Age of Onset   Heart disease Mother    Heart disease Father  Coronary artery disease Other     Social History  reports that he has never smoked. He has never used smokeless tobacco. He reports that he does not drink alcohol and does not use drugs.  Allergies  Allergen Reactions   Niacin And Related Other (See Comments)    Stomach Pain and flushing     Medications  No current facility-administered medications for this encounter.  Current Outpatient Medications:    amLODipine (NORVASC) 10 MG tablet, Take 1 tablet (10 mg total) by mouth daily., Disp: 90 tablet, Rfl: 3   aspirin 81 MG tablet, Take 81 mg by mouth daily., Disp: , Rfl:    atorvastatin (LIPITOR) 40 MG tablet, TAKE 1 TABLET EVERY DAY, Disp: 90 tablet, Rfl: 3   brimonidine (ALPHAGAN) 0.2 % ophthalmic solution, SMARTSIG:In Eye(s), Disp: , Rfl:    carvedilol (COREG) 12.5 MG tablet, TAKE 1 TABLET TWICE DAILY, Disp: 180 tablet, Rfl: 3   chlorthalidone (HYGROTON) 25 MG tablet, TAKE 1/2 TABLET EVERY DAY, Disp: 45 tablet, Rfl: 3   Cholecalciferol (VITAMIN D) 2000 units tablet, Take  2,000 Units by mouth daily., Disp: , Rfl:    dorzolamide-timolol (COSOPT) 22.3-6.8 MG/ML ophthalmic solution, Place 2 drops into both eyes 2 (two) times daily., Disp: , Rfl:    fenofibrate (TRICOR) 145 MG tablet, TAKE 1 TABLET EVERY DAY, Disp: 90 tablet, Rfl: 3   latanoprost (XALATAN) 0.005 % ophthalmic solution, Place 1 drop into both eyes at bedtime. , Disp: , Rfl:    Multiple Vitamin (MULTIVITAMIN) tablet, Take 1 tablet by mouth daily., Disp: , Rfl:    Omega-3 Fatty Acids (OMEGA 3 PO), Take 1 capsule by mouth daily., Disp: , Rfl:    potassium chloride SA (KLOR-CON M) 20 MEQ tablet, Take 1 tablet (20 mEq total) by mouth daily., Disp: 90 tablet, Rfl: 3   valsartan (DIOVAN) 320 MG tablet, TAKE 1 TABLET EVERY DAY, Disp: 90 tablet, Rfl: 3   Zinc 50 MG CAPS, Take 50 mg by mouth daily., Disp: , Rfl:   Vitals   Vitals:   03/28/24 0941  BP: (!) 151/77  Pulse: 68  Resp: 18  Temp: 97.7 F (36.5 C)  TempSrc: Oral  SpO2: 100%    There is no height or weight on file to calculate BMI.  Physical Exam   Constitutional: Appears well-developed and well-nourished.  Psych: Affect appropriate to situation.  Eyes: No scleral injection.  HENT: No OP obstruction.  Head: Normocephalic.  Cardiovascular: Normal rate and regular rhythm.  Respiratory: Effort normal, non-labored breathing.  GI: Soft.  No distension. There is no tenderness.  Skin: WDI.   Neurologic Examination   Mental Status -  Level of arousal and orientation to time, place, and person were intact He does have some expressive and receptive aphasia, no dysarthria.  Can name some objects, and can follow simple commands, trouble following some complex commands  Cranial Nerves II - XII - II - Visual field intact OU. III, IV, VI - Extraocular movements intact. V - Facial sensation intact bilaterally. VII - Facial movement intact bilaterally. VIII - Hearing & vestibular intact bilaterally. X - Palate elevates symmetrically. XI - Chin  turning & shoulder shrug intact bilaterally. XII - Tongue protrusion intact.  Motor Strength - The patient's strength was normal in all extremities and pronator drift was absent.  Bulk was normal and fasciculations were absent.   Motor Tone - Muscle tone was assessed at the neck and appendages and was normal. Sensory - Light touch, temperature/pinprick were  assessed and were symmetrical.   Coordination -no ataxia on right arm Gait and Station - deferred.  Labs/Imaging/Neurodiagnostic studies   CBC:  Recent Labs  Lab 04-19-24 1007  HGB 11.9*  HCT 35.0*   Basic Metabolic Panel:  Lab Results  Component Value Date   NA 140 04/19/2024   K 4.0 April 19, 2024   CO2 20 11/05/2023   GLUCOSE 111 (H) Apr 19, 2024   BUN 32 (H) Apr 19, 2024   CREATININE 1.40 (H) 2024/04/19   CALCIUM 10.1 11/05/2023   GFRNONAA 55 (L) 08/12/2020   GFRAA 63 08/12/2020   Lipid Panel:  Lab Results  Component Value Date   LDLCALC 68 11/05/2023   HgbA1c:  Lab Results  Component Value Date   HGBA1C 5.9 (H) 11/30/2021   Urine Drug Screen: No results found for: "LABOPIA", "COCAINSCRNUR", "LABBENZ", "AMPHETMU", "THCU", "LABBARB"  Alcohol Level No results found for: "ETH" INR No results found for: "INR" APTT No results found for: "APTT" AED levels: No results found for: "PHENYTOIN", "ZONISAMIDE", "LAMOTRIGINE", "LEVETIRACETA"  CT Head without contrast(Personally reviewed): No acute process, aspects 10  CT angio Head and Neck with contrast(Personally reviewed): 1.60% 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 2. 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 3. Severe stenosis at the origin of the left vertebral artery. 4. Dense calcifications at the left carotid bifurcation without a significant stenosis    ASSESSMENT   Xavier Bell is a 83 y.o. male hx of a flutter not on anticoagulation, hypertension, hyperlipidemia, CAD s/p CABG who presents to [facility] ED via EMS for  evaluation of difficulty speaking.  Presentation is clinically consistent with stroke. He is outside window for tnkase and therefore not a candidate. He does not have an LVO and therefore not a candidate for thrombectomy.  Suspect etiology of stroke is either cardioembolic in the setting of afibb not on AC or atheroembol from dense calcification of the L ICA at the origin with no significant stenosis.  RECOMMENDATIONS  - admit for stroke workup  - HgbA1c, fasting lipid panel - MRI of the brain without contrast - Frequent neuro checks - Echocardiogram - CTA head and neck - Prophylactic therapy-Antiplatelet med: Aspirin - dose 81mg  and plavix 75mg  daily  after 300mg  load  - Risk factor modification - Telemetry monitoring - PT consult, OT consult, Speech consult - Stroke team to follow  ______________________________________________________________________   Signed, Mathews Argyle, NP [facility]  NEUROHOSPITALIST ADDENDUM Performed a face to face diagnostic evaluation.   I have reviewed the contents of history and physical exam as documented by PA/ARNP/Resident and agree with above documentation.  I have discussed and formulated the above plan as documented. Edits to the note have been made as needed.  This patient is critically ill and at significant risk of neurological worsening, death and care requires constant monitoring of vital signs, hemodynamics,respiratory and cardiac monitoring, neurological assessment, discussion with family, other specialists and medical decision making of high complexity. I spent 40 minutes of neurocritical care time  in the care of  this patient. This was time spent independent of any time provided by nurse practitioner or PA.  Erick Blinks [facility] 04-19-2024  1:32 PM  Erick Blinks, MD [facility] [phone]   If 7pm to 7am, please call on call as listed on AMION.

## 2024-03-28 NOTE — Assessment & Plan Note (Signed)
 Hold regimen. Restart in 24-48 hours

## 2024-03-28 NOTE — ED Notes (Signed)
 Sent a rx request for pt eye drops

## 2024-03-28 NOTE — ED Provider Notes (Signed)
 [place] EMERGENCY DEPARTMENT AT [facility] Provider Note   CSN: [id number] Arrival date & time: 03/28/24  0865  An emergency department physician performed an initial assessment on this suspected stroke patient at 73.  History  No chief complaint on file.   Xavier Bell is a 83 y.o. male.  Patient is a 83 year old male with a history of coronary artery disease, hypertension, atrial flutter, not on anticoagulants who presents with difficulty with his speech.  His wife is out of town and called in this morning and noticed that he was having trouble getting words out.  He reports that he noticed some difficulty moving his right leg this morning.  The last time that his wife spoke to him was 3 PM yesterday.  However he states he was not having any of the leg weakness when he went to bed last night which was around 1030.  He also did not notice it during the night when he went to the bathroom.       Home Medications Prior to Admission medications   Medication Sig Start Date End Date Taking? Authorizing Provider  amLODipine (NORVASC) 10 MG tablet Take 1 tablet (10 mg total) by mouth daily. 11/14/23   [name], Deloris Ping, MD  aspirin 81 MG tablet Take 81 mg by mouth daily.    [provider]  atorvastatin (LIPITOR) 40 MG tablet TAKE 1 TABLET EVERY DAY 10/25/23   [name], Deloris Ping, MD  brimonidine (ALPHAGAN) 0.2 % ophthalmic solution Place 1 drop into both eyes in the morning and at bedtime. 07/17/23   [provider]  carvedilol (COREG) 12.5 MG tablet TAKE 1 TABLET TWICE DAILY 11/18/23   [name], Deloris Ping, MD  chlorthalidone (HYGROTON) 25 MG tablet TAKE 1/2 TABLET EVERY DAY 12/31/23   [name], Deloris Ping, MD  Cholecalciferol (VITAMIN D) 2000 units tablet Take 2,000 Units by mouth daily.    [provider]  dorzolamide-timolol (COSOPT) 22.3-6.8 MG/ML ophthalmic solution Place 2 drops into both eyes 2 (two) times daily.    [provider]   fenofibrate (TRICOR) 145 MG tablet TAKE 1 TABLET EVERY DAY 12/27/23   [name], Deloris Ping, MD  latanoprost (XALATAN) 0.005 % ophthalmic solution Place 1 drop into both eyes at bedtime.  07/15/17   [provider]  Multiple Vitamin (MULTIVITAMIN) tablet Take 1 tablet by mouth daily.    [provider]  Omega-3 Fatty Acids (OMEGA 3 PO) Take 1 capsule by mouth daily.    [provider]  potassium chloride SA (KLOR-CON M) 20 MEQ tablet Take 1 tablet (20 mEq total) by mouth daily. 02/11/23   [name], Deloris Ping, MD  valsartan (DIOVAN) 320 MG tablet TAKE 1 TABLET EVERY DAY 10/18/23   [name], Deloris Ping, MD  Zinc 50 MG CAPS Take 50 mg by mouth daily.    [provider]      Allergies    Niacin and related    Review of Systems   Review of Systems  Unable to perform ROS: Mental status change    Physical Exam Updated Vital Signs BP (!) 148/85   Pulse 68   Temp 97.7 F (36.5 C) (Oral)   Resp 13   SpO2 91%  Physical Exam Constitutional:      Appearance: He is well-developed.  HENT:     Head: Normocephalic and atraumatic.  Eyes:     Pupils: Pupils are equal, round, and reactive to light.  Cardiovascular:     Rate and Rhythm:  Normal rate and regular rhythm.     Heart sounds: Normal heart sounds.  Pulmonary:     Effort: Pulmonary effort is normal. No respiratory distress.     Breath sounds: Normal breath sounds. No wheezing or rales.  Chest:     Chest wall: No tenderness.  Abdominal:     General: Bowel sounds are normal.     Palpations: Abdomen is soft.     Tenderness: There is no abdominal tenderness. There is no guarding or rebound.  Musculoskeletal:        General: Normal range of motion.     Cervical back: Normal range of motion and neck supple.  Lymphadenopathy:     Cervical: No cervical adenopathy.  Skin:    General: Skin is warm and dry.     Findings: No rash.  Neurological:     Mental Status: He is alert and oriented to person, place, and time.      Comments: Patient is oriented to person place and month but does seem to have aphasia.  He has difficulty with naming objects.  He reports some subjective weakness in his right arm and right leg although he does not have a drift.  No obvious facial drooping or slurred speech.     ED Results / Procedures / Treatments   Labs (all labs ordered are listed, but only abnormal results are displayed) Labs Reviewed  COMPREHENSIVE METABOLIC PANEL WITH GFR - Abnormal; Notable for the following components:      Result Value   CO2 20 (*)    Glucose, Bld 113 (*)    BUN 33 (*)    Creatinine, Ser 1.37 (*)    Total Protein 6.4 (*)    GFR, Estimated 51 (*)    All other components within normal limits  CBC - Abnormal; Notable for the following components:   RBC 3.86 (*)    Hemoglobin 12.3 (*)    HCT 37.3 (*)    All other components within normal limits  CBG MONITORING, ED - Abnormal; Notable for the following components:   Glucose-Capillary 120 (*)    All other components within normal limits  I-STAT CHEM 8, ED - Abnormal; Notable for the following components:   BUN 32 (*)    Creatinine, Ser 1.40 (*)    Glucose, Bld 111 (*)    TCO2 21 (*)    Hemoglobin 11.9 (*)    HCT 35.0 (*)    All other components within normal limits  ETHANOL  PROTIME-INR  APTT  DIFFERENTIAL  RAPID URINE DRUG SCREEN, HOSP PERFORMED  URINALYSIS, ROUTINE W REFLEX MICROSCOPIC  HEMOGLOBIN A1C  CBG MONITORING, ED    EKG EKG Interpretation Date/Time:  Saturday March 28 2024 09:45:04 EDT Ventricular Rate:  61 PR Interval:  301 QRS Duration:  129 QT Interval:  440 QTC Calculation: 444 R Axis:   103  Text Interpretation: Sinus rhythm Prolonged PR interval Nonspecific intraventricular conduction delay Repol abnrm suggests ischemia, lateral leads since last tracing no significant change Confirmed by Rolan Bucco [phone]) on 03/28/2024 11:40:44 AM  Radiology CT Angio Head Neck W WO CM (CODE STROKE) Result Date:  03/28/2024 CLINICAL DATA:  Neuro deficit, acute, stroke suspected.  Aphasia. EXAM: CT ANGIOGRAPHY HEAD AND NECK WITH AND WITHOUT CONTRAST TECHNIQUE: Multidetector CT imaging of the head and neck was performed using the standard protocol during bolus administration of intravenous contrast. Multiplanar CT image reconstructions and MIPs were obtained to evaluate the vascular anatomy. Carotid stenosis measurements (when applicable) are  obtained utilizing NASCET criteria, using the distal internal carotid diameter as the denominator. RADIATION DOSE REDUCTION: This exam was performed according to the departmental dose-optimization program which includes automated exposure control, adjustment of the mA and/or kV according to patient size and/or use of iterative reconstruction technique. CONTRAST:  75mL OMNIPAQUE IOHEXOL 350 MG/ML SOLN COMPARISON:  CT head without contrast 03/28/2024. FINDINGS: CTA NECK FINDINGS Aortic arch: Atherosclerotic calcifications are present at the aortic arch. Calcifications are present at the origin of the left subclavian artery and left common carotid artery without significant stenosis. No aneurysm is present. Right carotid system: The right common carotid artery is within normal limits. Dense calcifications are present at the proximal right ICA lumen is narrowed to 2 mm. This compares to more normal distal lumen of 5 mm. Left carotid system: The left common carotid artery is within normal limits. Dense calcifications are present at the left carotid bifurcation without a significant stenosis relative to the more distal vessel. The cervical left ICA is otherwise within normal limits. Vertebral arteries: The right vertebral artery is dominant vessel. Dense calcifications are present at the origins of both vertebral arteries. 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 The lumen is narrowed to 1.4 mm. Severe left scratched at severe stenosis is present at the origin of the  left vertebral artery. No tandem stenoses are present in either vertebral artery in the neck. Skeleton: Multilevel degenerative changes are present within the cervical spine. Grade 1 degenerative anterolisthesis is present C4-5. Chronic endplate degenerative changes present C5-6 6 and C6-7. No focal osseous lesions are present. Other neck: The soft tissues of the neck are otherwise unremarkable. Salivary glands are within normal limits. Thyroid is normal. No significant adenopathy is present. No focal mucosal or submucosal lesions are present. Upper chest: The lung apices are clear. The thoracic inlet is within normal limits. Review of the MIP images confirms the above findings CTA HEAD FINDINGS Anterior circulation: Atherosclerotic calcifications are present within the cavernous internal carotid arteries without significant stenosis through the ICA termini. The A1 and M1 segments are normal. The anterior communicating artery is patent. The MCA bifurcations are within normal limits. The ACA and MCA branch vessels are normal. No aneurysm is present. Posterior circulation: The right vertebral artery is dominant. Atherosclerotic calcifications are present at the dural margin of the right vertebral artery without a significant stenosis relative to the more distal vessel. The PICA origins are visualized and normal. Vertebrobasilar junction and basilar artery normal. The superior cerebellar arteries are patent. The right posterior cerebral artery originates from basilar tip. The left posterior cerebral artery is of fetal type. Small left P1 segment is noted. The PCA branch vessels are within normal limits bilaterally. No aneurysm is present. Venous sinuses: The dural sinuses are patent. The straight sinus and deep cerebral veins are intact. Cortical veins are within normal limits. No significant vascular malformation is evident. Anatomic variants: Fetal type left posterior cerebral artery. Review of the MIP images  confirms the above findings IMPRESSION: 1. 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 2. 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 3. Severe stenosis at the origin of the left vertebral artery. 4. Dense calcifications at the left carotid bifurcation without a significant stenosis relative to the more distal vessel. 5. Atherosclerotic calcifications within the cavernous internal carotid arteries without significant stenosis through the ICA termini. 6. No significant proximal stenosis, aneurysm, or branch vessel occlusion within the Circle of Willis. 7. Multilevel degenerative  changes of the cervical spine. Electronically Signed   By: Marin Roberts M.D.   On: 03/28/2024 10:42   CT HEAD CODE STROKE WO CONTRAST Result Date: 03/28/2024 CLINICAL DATA:  Code stroke.  Aphasia. EXAM: CT HEAD WITHOUT CONTRAST TECHNIQUE: Contiguous axial images were obtained from the base of the skull through the vertex without intravenous contrast. RADIATION DOSE REDUCTION: This exam was performed according to the departmental dose-optimization program which includes automated exposure control, adjustment of the mA and/or kV according to patient size and/or use of iterative reconstruction technique. COMPARISON:  No acute infarct, hemorrhage, FINDINGS: Brain: No acute infarct, hemorrhage, or mass lesion is present. Mild atrophy and white matter changes are noted bilaterally. Deep brain nuclei are within normal limits. The ventricles are of normal size. No significant extraaxial fluid collection is present. The brainstem and cerebellum are within normal limits. Midline structures are within normal limits. Vascular: Atherosclerotic calcifications are present within the cavernous internal carotid arteries bilaterally. Calcifications are present at the dural margin both vertebral arteries, right greater than left. No hyperdense vessel is present. Skull: Calvarium is intact. No focal lytic or blastic  lesions are present. No significant extracranial soft tissue lesion is present. Sinuses/Orbits: Chronic right maxillary sinusitis is present. Diffuse wall thickening is present. Less prominent wall thickening is present on the left without active sinus disease. Mild mucosal thickening is present in the ethmoid air cells bilaterally. ASPECTS [facility] Stroke Program Early CT Score) - Ganglionic level infarction (caudate, lentiform nuclei, internal capsule, insula, M1-M3 cortex): 7/7 - Supraganglionic infarction (M4-M6 cortex): 3/3 Total score (0-10 with 10 being normal): 10/10 IMPRESSION: 1. No acute intracranial abnormality or significant interval change. 2. Aspects is 10/10. 3. Mild atrophy and white matter disease likely reflects the sequela of chronic microvascular ischemia. 4. Chronic right maxillary sinusitis. The above was relayed via text pager to Dr. Tollie Eth on 03/28/2024 at 10:22 . Electronically Signed   By: Marin Roberts M.D.   On: 03/28/2024 10:23    Procedures Procedures    Medications Ordered in ED Medications  clopidogrel (PLAVIX) tablet 300 mg (has no administration in time range)    Followed by  clopidogrel (PLAVIX) tablet 75 mg (has no administration in time range)  aspirin chewable tablet 81 mg (has no administration in time range)   stroke: early stages of recovery book (has no administration in time range)  iohexol (OMNIPAQUE) 350 MG/ML injection 75 mL (75 mLs Intravenous Contrast Given 03/28/24 1030)    ED Course/ Medical Decision Making/ A&P                                 Medical Decision Making Amount and/or Complexity of Data Reviewed Labs: ordered. Radiology: ordered.  Risk Prescription drug management.   Patient is 83 year old who presents with difficulty with his speech.  He has noted to have some aphasia on my evaluation and reported right sided weakness although he does not seem to have a drift or significant weakness on my exam.  However given the  symptoms and the fact that he was Zenaida Niece positive, code stroke was activated here in the ED.  He had a CT of his head did not show any acute abnormality.  CT a of his head and neck showed some stenosis of the vertebral and right ICA arteries.  He was seen by neurology.  He was out of the window for thrombolytics.  Given his mild symptoms, it  was not felt to be a candidate for IR intervention.  Labs reviewed and are nonconcerning.  EKG shows a sinus rhythm.  He does have a history of A-fib but is not currently on anticoagulants.  He has been seen by neurology.  Will admit for further stroke evaluation.  Discussed with the hospitalist.  Final Clinical Impression(s) / ED Diagnoses Final diagnoses:  Aphasia  Right sided weakness    Rx / DC Orders ED Discharge Orders     None         Rolan Bucco, MD 03/28/24 1149

## 2024-03-28 NOTE — ED Notes (Signed)
 Provider called gave update about BP meds and POC. PT informed about BP hold and POC

## 2024-03-29 DIAGNOSIS — I739 Peripheral vascular disease, unspecified: Secondary | ICD-10-CM

## 2024-03-29 DIAGNOSIS — I63132 Cerebral infarction due to embolism of left carotid artery: Secondary | ICD-10-CM | POA: Diagnosis not present

## 2024-03-29 DIAGNOSIS — I63212 Cerebral infarction due to unspecified occlusion or stenosis of left vertebral arteries: Secondary | ICD-10-CM

## 2024-03-29 DIAGNOSIS — I441 Atrioventricular block, second degree: Secondary | ICD-10-CM

## 2024-03-29 DIAGNOSIS — R001 Bradycardia, unspecified: Secondary | ICD-10-CM | POA: Diagnosis not present

## 2024-03-29 DIAGNOSIS — E785 Hyperlipidemia, unspecified: Secondary | ICD-10-CM

## 2024-03-29 DIAGNOSIS — R29700 NIHSS score 0: Secondary | ICD-10-CM

## 2024-03-29 DIAGNOSIS — Z7902 Long term (current) use of antithrombotics/antiplatelets: Secondary | ICD-10-CM

## 2024-03-29 HISTORY — DX: Atrioventricular block, second degree: I44.1

## 2024-03-29 LAB — CBC
HCT: 32.6 % — ABNORMAL LOW (ref 39.0–52.0)
Hemoglobin: 11.1 g/dL — ABNORMAL LOW (ref 13.0–17.0)
MCH: 32.5 pg (ref 26.0–34.0)
MCHC: 34 g/dL (ref 30.0–36.0)
MCV: 95.3 fL (ref 80.0–100.0)
Platelets: 267 10*3/uL (ref 150–400)
RBC: 3.42 MIL/uL — ABNORMAL LOW (ref 4.22–5.81)
RDW: 12.9 % (ref 11.5–15.5)
WBC: 7.4 10*3/uL (ref 4.0–10.5)
nRBC: 0 % (ref 0.0–0.2)

## 2024-03-29 LAB — BASIC METABOLIC PANEL WITH GFR
Anion gap: 11 (ref 5–15)
BUN: 28 mg/dL — ABNORMAL HIGH (ref 8–23)
CO2: 21 mmol/L — ABNORMAL LOW (ref 22–32)
Calcium: 8.9 mg/dL (ref 8.9–10.3)
Chloride: 107 mmol/L (ref 98–111)
Creatinine, Ser: 1.27 mg/dL — ABNORMAL HIGH (ref 0.61–1.24)
GFR, Estimated: 55 mL/min — ABNORMAL LOW (ref >60)
Glucose, Bld: 105 mg/dL — ABNORMAL HIGH (ref 70–99)
Potassium: 3.6 mmol/L (ref 3.5–5.1)
Sodium: 139 mmol/L (ref 135–145)

## 2024-03-29 LAB — LIPID PANEL
Cholesterol: 92 mg/dL (ref 0–200)
HDL: 25 mg/dL — ABNORMAL LOW (ref >40)
LDL Cholesterol: 46 mg/dL (ref 0–99)
Total CHOL/HDL Ratio: 3.7 ratio
Triglycerides: 107 mg/dL (ref <150)
VLDL: 21 mg/dL (ref 0–40)

## 2024-03-29 NOTE — Evaluation (Signed)
 Speech Language Pathology Evaluation Patient Details Name: Xavier Bell MRN: [id number] DOB: Aug 25, 1941 Today's Date: 03/29/2024 Time:  -     Problem List:  Patient Active Problem List   Diagnosis Date Noted   Second degree AV block, Mobitz type II 03/29/2024   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   Glaucoma 03/28/2024   Prediabetes 01/01/2022   Stage 3a chronic kidney disease (HCC) 01/01/2022   Mixed hyperlipidemia 12/31/2016   Carotid artery disease (HCC) 09/04/2013   HTN (hypertension) 12/31/2011   Vitamin D deficiency 12/31/2011   Atrial flutter (HCC) 11/09/2011   Coronary artery disease    Hypertension    Dyslipidemia    MI (myocardial infarction) (HCC)    Retinal detachment    Pseudophakia    Atrial fibrillation (HCC)    Past Medical History:  Past Medical History:  Diagnosis Date   Atrial flutter (HCC)    s/p cardioversion 02/08/12; ablation by Dr Ladona Ridgel [phone]   Chronic anticoagulation    Coronary artery disease    s/p remote MI in 1994 with multiple PCI and subsequent CABG x 5 in 1996   Dyslipidemia    Hypertension    MI (myocardial infarction) (HCC) 1995   Normal nuclear stress test 2006   Pseudoaphakia    both eyes   Retinal detachment    Past Surgical History:  Past Surgical History:  Procedure Laterality Date   ATRIAL FLUTTER ABLATION N/A 12/08/2014   CTI by Dr Ladona Ridgel   CARDIAC CATHETERIZATION  1996   CARDIOVERSION  02/08/2012   Procedure: CARDIOVERSION;  Surgeon: Elyn Aquas., MD;  Location: [facility] OR;  Service: Cardiovascular;  Laterality: N/A;   CORONARY ARTERY BYPASS GRAFT  1996   LIMA to LAD, SVG to RV marginal & PD, SVG  to DX and SVG to Ramus   ORIF ANKLE FRACTURE Left 12/10/2019   Procedure: Open Reduction Internal fixation (ORIF) Left Ankle Fracture;  Surgeon: Toni Arthurs, MD;  Location: [facility];  Service: Orthopedics;  Laterality: Left;   TONSILLECTOMY AND ADENOIDECTOMY     HPI:  Patient was in his usual state of  health until he went to bed last evening.  Further patient seems to have gotten up several times overnight at different times approximated at 2 am and 5 AM and had no trouble walking at that time.  Patient subsequently woke up at approximately 8 AM and noticed marked right lower extremity weakness with marked difficulty maintaining gait.  However denies any fall or trauma.  Subsequently patient's wife called the patient over phone at approximately 8:30 AM.  Wife noted that the patient was having difficulty speaking in terms of finding words.  patient seems to have multifocal infarction of the left side suggestive of embolic phenomenon.  Involving left temporal left parietal and left frontal lobe as as well as the left sylvian fissure.  Patient's aphasia and right lower extremity weakness seem to be markedly improving.   Assessment / Plan / Recommendation Clinical Impression  Pt demonstrates very mild language impairment; pt has rare instances of circumlocutions when searching for a specific word in conversation. He is fluent, able to repeat, read and write. comprehension is good. He feels his writing is a bit more effortful. He confirms it is very small, possible micrographia explained. Suggested OP SLP if pt wants to further address word finding strategies after d/c. Could also improve handwriting with OT as he reports this is very important to him. Will sign off in acute care. F/u in OP  per patient and family wishes.    SLP Assessment  SLP Recommendation/Assessment: All further Speech Lanaguage Pathology  needs can be addressed in the next venue of care SLP Visit Diagnosis: Aphasia (R47.01)    Recommendations for follow up therapy are one component of a multi-disciplinary discharge planning process, led by the attending physician.  Recommendations may be updated based on patient status, additional functional criteria and insurance authorization.    Follow Up Recommendations  Outpatient SLP     Assistance Recommended at Discharge     Functional Status Assessment    Frequency and Duration           SLP Evaluation Cognition  Overall Cognitive Status: Within Functional Limits for tasks assessed Orientation Level: Oriented X4       Comprehension  Auditory Comprehension Overall Auditory Comprehension: Appears within functional limits for tasks assessed Visual Recognition/Discrimination Discrimination: Within Function Limits Reading Comprehension Reading Status: Within funtional limits    Expression Verbal Expression Overall Verbal Expression: Appears within functional limits for tasks assessed Initiation: No impairment Automatic Speech: Name;Social Response Level of Generative/Spontaneous Verbalization: Conversation Repetition: No impairment Naming: Impairment Confrontation: Impaired Verbal Errors: Other (comment) (mild circumlocutions) Pragmatics: No impairment   Oral / Motor  Oral Motor/Sensory Function Overall Oral Motor/Sensory Function: Within functional limits Motor Speech Overall Motor Speech: Appears within functional limits for tasks assessed            Allycia Pitz, Riley Nearing 03/29/2024, 1:34 PM

## 2024-03-29 NOTE — Progress Notes (Signed)
 [facility] PROGRESS NOTE  Xavier Bell (DOB: 12-Nov-1941) QQV:[id number] PCP: Georgina Quint, MD  Brief Narrative: HOA DERISO is an 83 y.o. male with a history of atrial flutter s/p ablation 2015 not on anticoagulation, CAD s/p CABG, HTN, glaucoma who presented to the ED on 03/28/2024 due to speech abnormalities and right leg weakness impairing gait. Symptoms improved in the ED but work up with brain MRI showed acute infarct in the posterior aspect of the left Sylvian fissure extending to the atrium of the left ventricle and additional infarcts in the left posterior temporal, parietal and anterior inferior frontal lobes. CTA showed high grade stenoses in the proximal right and left vertebral arteries, 60% proximal right ICA stenosis, and left carotid calcification without stenosis. He was admitted with neurology consultation and overnight, despite holding beta blocker had bradycardia associated with 2nd degree type 2 AV block for which cardiology was consulted.  Subjective: In good spirits, speech has improved though speech content is not back to normal. Spouse and daughter at bedside. Pt denies lightheadedness, palpitations, chest pain or dyspnea. His right leg still feels heavy.   Objective: BP (!) 129/43 (BP Location: Right Arm)   Pulse 61   Temp 98.4 F (36.9 C) (Oral)   Resp 16   Ht 5\' 7"  (1.702 m)   Wt 70.8 kg   SpO2 99%   BMI 24.45 kg/m   Gen: Elderly male in no distress Pulm: Clear, nonlabored  CV: RRR, soft II/VI systolic murmur at RUSB, no edema GI: Soft, NT, ND, +BS  Neuro: Alert and oriented with expressive aphasia, incorrect word content and stuttering speech pattern, subtle RLE weakness. No new focal deficits. Ext: Warm, no deformities Skin: No rashes, lesions or ulcers on visualized skin   Assessment & Plan: Acute left sided ischemic CVAs: Cardioembolic vs. atheroembolic from densely calcified left ICA.  - Echo pending - Continue DAPT x3 weeks  with subsequent plavix monotherapy per Dr. Pearlean Brownie.  - Continue atorvastatin 40mg , LDL at goal of 46.  - PT/OT pending, anticipate outpatient vs. HH therapy.   2nd degree AV block, Mobitz II:  - Improving overall with beta blocker washout. This appeared asymptomatic. Cardiology consulted, will arrange 30 day outpatient cardiac monitor after discharge unless telemetry during hospitalization indicates need for more urgent intervention.  - Continue holding coreg - Echo as above  CAD s/p CABG, HTN, HLD:  - Antiplatelet plan as above - Hold carvedilol - Continue statin  Glaucoma: Continue gtt's  Prediabetes: HbA1c 5.8%.  - Dietary modification  Tyrone Nine, MD [facility] [email or web address] 03/29/2024, 4:44 PM

## 2024-03-29 NOTE — Progress Notes (Signed)
 STROKE TEAM PROGRESS NOTE   BRIEF HPI [name] is a 83 y.o. male with history of atrial flutter not on anticoagulation (no recurrence in > 10 years s/p ablations), HTN, HLD, MI, CAD s/p CABG presenting with difficulty speaking noticed by his wife on the phone the morning of 4/5.  Patient was not offered TNK as he presented to the ED outside of the thrombolytic therapy time window.    SIGNIFICANT HOSPITAL EVENTS 4/5:  - Presentation to ED for difficulty speaking. CODE STROKE activated by EDP. NIH: 4  - Head CT with no acute process, aspects 10. - MRI brain with acute infarct of the posterior aspect of the left Sylvian fissure extending to the atrium of the left lateral ventricle with additional small infarcts involving the posterior left temporal, left parietal lobe, and anterior inferior left frontal lobe.  - CTA with 60% stenosis of the proximal right ICA, high-grade stenosis at the origin of the proximal right vertebral artery, severe stenosis at the origin of the left vertebral artery.  Dense calcifications at the left carotid bifurcation without a significant stenosis relative to the more distal vessel.  Atherosclerotic calcifications within the cavernous ICAs without significant stenosis through the ICA termini.  No significant proximal stenosis, aneurysm, or branch vessel occlusion within the circle of Willis.   INTERIM HISTORY/SUBJECTIVE  Wife and daughter are at bedside during exam this morning.  Patient with bradycardia and a second-degree AV block overnight with rate to the 20s and 30s.  No evidence of atrial flutter.  Cardiology followed patient this morning, carvedilol held with improvement in heart rate.  Recommending 30-day event monitor after discharge for recurrence of AV block off of carvedilol potentially needing PPM placement.  Patient states that he woke up on Saturday morning at around 06:30 and wanted to go take a shower but when he got into the shower he noticed  that his right leg felt weak and "not working like it should".  He then spoke to his wife on the phone who was out of town and she noticed that he was not finding words like he should be.  She called him again 20 minutes later and he was still having trouble speaking comprehensible sentences and she contacted their daughter to bring him to the ED.    Discussed clinical trial at length with patient, wife, and daughter at bedside and provided pamphlet for [place] stroke trial. Will discuss this afternoon for potential enrollment.   OBJECTIVE CBC    Component Value Date/Time   WBC 7.4 03/29/2024 0513   RBC 3.42 (L) 03/29/2024 0513   HGB 11.1 (L) 03/29/2024 0513   HGB 12.4 (L) 08/09/2017 1630   HCT 32.6 (L) 03/29/2024 0513   HCT 37.5 08/09/2017 1630   PLT 267 03/29/2024 0513   PLT 296 08/09/2017 1630   MCV 95.3 03/29/2024 0513   MCV 90 08/09/2017 1630   MCH 32.5 03/29/2024 0513   MCHC 34.0 03/29/2024 0513   RDW 12.9 03/29/2024 0513   RDW 13.9 08/09/2017 1630   LYMPHSABS 1.1 03/28/2024 0944   MONOABS 0.7 03/28/2024 0944   EOSABS 0.0 03/28/2024 0944   BASOSABS 0.0 03/28/2024 0944   BMET    Component Value Date/Time   NA 139 03/29/2024 0513   NA 140 11/05/2023 1538   K 3.6 03/29/2024 0513   CL 107 03/29/2024 0513   CO2 21 (L) 03/29/2024 0513   GLUCOSE 105 (H) 03/29/2024 0513   BUN 28 (H) 03/29/2024 1610  BUN 30 (H) 11/05/2023 1538   CREATININE 1.27 (H) 03/29/2024 0513   CREATININE 1.11 05/28/2016 0816   CALCIUM 8.9 03/29/2024 0513   EGFR 63 11/05/2023 1538   GFRNONAA 55 (L) 03/29/2024 0513   Lab Results  Component Value Date   HGBA1C 5.8 (H) 03/28/2024   Lab Results  Component Value Date   CHOL 92 03/29/2024   HDL 25 (L) 03/29/2024   LDLCALC 46 03/29/2024   TRIG 107 03/29/2024   CHOLHDL 3.7 03/29/2024   Drugs of Abuse     Component Value Date/Time   LABOPIA NONE DETECTED 03/28/2024 1248   COCAINSCRNUR NONE DETECTED 03/28/2024 1248   LABBENZ NONE DETECTED  03/28/2024 1248   AMPHETMU NONE DETECTED 03/28/2024 1248   THCU NONE DETECTED 03/28/2024 1248   LABBARB NONE DETECTED 03/28/2024 1248    IMAGING past 24 hours No results found.  Vitals:   03/29/24 0711 03/29/24 0900 03/29/24 1101 03/29/24 1200  BP:  (!) 147/59  (!) 154/80  Pulse:  (!) 56  65  Resp:  16  14  Temp: 98.3 F (36.8 C)  98.2 F (36.8 C)   TempSrc: Oral  Oral   SpO2:  95%  97%   PHYSICAL EXAM General:  Alert, well-nourished, well-developed Caucasian male in no acute distress Psych:  Mood and affect appropriate for situation. Patient is calm and cooperative with exam CV: Bradycardia with secondary AV block, rate in the 50s Respiratory:  Regular, unlabored respirations on room air GI: Abdomen soft and nontender  NEURO:   Mental Status: AA&Ox3, patient is able to give clear and coherent history Speech/Language: speech is without dysarthria or aphasia.  Naming, repetition, fluency, and comprehension intact.  Cranial Nerves:  II: PERRL. Visual fields full.  III, IV, VI: EOMI. Eyelids elevate symmetrically.  V: Sensation is intact to light touch and symmetrical to face.  VII: Face is symmetrical resting and smiling VIII: Hearing is intact to voice. IX, X: Palate elevates symmetrically. Phonation is normal.  ZO:[id number] shrug 5/5. XII: Tongue protrudes midline Motor: 5/5 strength to all muscle groups tested.  Tone: is normal and bulk is normal Sensation: Intact to light touch bilaterally. Extinction absent to light touch to DSS.   Coordination: FTN intact bilaterally, HKS: no ataxia in BLE.No drift.  Gait: deferred  Most Recent NIH 0    ASSESSMENT/PLAN  [name] is a 83 y.o. male with history of a flutter not on anticoagulation, hypertension, hyperlipidemia, CAD s/p CABG who presents to [facility] ED via EMS for evaluation of difficulty speaking, admitted for stroke workup.  NIH on Admission: 4.  Acute Ischemic Infarcts Posterior Left Sylvian  fissure area, posterior left temporal lobe, left parietal lobe, anterior inferior left frontal lobe Etiology:  Suspect etiology of stroke is either cardioembolic in the setting of afibb not on AC or atheroembol from dense calcification of the L ICA at the origin with no significant stenosis  Code Stroke CT head No acute abnormality. Small vessel disease. Atrophy. ASPECTS 10.    CTA head & neck  60% stenosis right ICA High-grade stenosis R vertebral Severe stenosis origin of Left Vertebral Dense calcifications of left carotid bifurcation MRI    Restricted diffusion along the posterior aspect of the left Sylvian fissure extending to the atrium of the left lateral ventricle consistent with acute infarct. Additional small infarcts involving the posterior left temporal lobe, left parietal lobe and anterior inferior left frontal lobe. No acute hemorrhage or mass lesion. 2D Echo: PENDING Carotid  US 04/2023: Mild Disease Right Carotid Moderate Plaque Left Carotid LDL 46 HgbA1c 5.8 VTE prophylaxis - lovenox aspirin 81 mg daily prior to admission, now on aspirin 81 mg daily and clopidogrel 75 mg daily for 3 weeks  and then Plavix alone. Therapy recommendations:  Outpatient SLP, pending OT and PT evaluation and recommendations Disposition:  pending  Hx of Atrial flutter S.p cardioversion 2013 S/p ablation 2015 Home Meds: carvedilol 12.25mg  BID, held in the setting of significant bradycardia and second-degree AV block overnight Continue telemetry monitoring Noted to have bradycardia with 2nd degree AVB overnight with rate to the 20s and 30s No evidence of Atrial Flutter on monitor this admission or since ablation >10 years ago Cardiology consult recommends 30-day monitor on discharge to evaluate for further secondary AV block off of AV node blockers potentially necessitating PPM placement  Hypertension Home meds:  Hygroton 12.5mg  daily, valsartan 320mg , Coreg--held Stable Blood pressure goal:  gradual normotension. Hold Coreg in the setting of degree AV block with bradycardia overnight Avoid hypotension.   Hyperlipidemia Home meds:  Lipitor 40mg , resumed in hospital LDL 46, goal < 70 Continue statin at discharge  Diabetes type II, Pre-diabetic Home meds:  none HgbA1c 5.8, goal < 7.0 CBGs SSI Recommend close follow-up with PCP  Other Stroke Risk Factors Coronary artery disease S/p remote MI 1993  Other Active Problems   Hospital day # 1  Lanae Boast, AGACNP-BC [facility] Pager: [phone]  STROKE MD NOTE :  I have personally obtained history,examined this patient, reviewed notes, independently viewed imaging studies, participated in medical decision making and plan of care.ROS completed by me personally and pertinent positives fully documented  I have made any additions or clarifications directly to the above note. Agree with note above.  Patient presented with transient episode of expressive aphasia and speech difficulties with MRI scan showing left insular cortical and subcortical infarct.  He does have a remote history of atrial flutter in 2013 in 2015 treated with ablation and has been off anticoagulation without any recurrence since then.  CT angiogram showed calcification in the left carotid bifurcation without significant stenosis.  Recommend aspirin and Plavix for 3 weeks followed by Plavix alone.  Aggressive risk factor modification.  Check 30-day heart monitor for paroxysmal A-fib.  Patient initially expressed interest to consider participation in [place] Stroke trial and was given information to review but subsequently decided against participation.  Long discussion with patient and his wife and family at the bedside and answered questions.  Discussed with Dr. Alycia Rossetti.  Discussed with Dr. Rennis Golden cardiology.  Greater than 50% time during this 50-minute visit was spent on counseling and coordination of care and discussion patient and family and care  team and answering questions.  Delia Heady, MD Medical Director [facility] Pager: [phone] 03/29/2024 3:05 PM  To contact Stroke Continuity provider, please refer to [email or web address]. After hours, contact General Neurology

## 2024-03-29 NOTE — Plan of Care (Signed)
 Problem: Education: Goal: Knowledge of disease or condition will improve 03/29/2024 1520 by Waynette Buttery, RN Outcome: Progressing 03/29/2024 1519 by Waynette Buttery, RN Outcome: Progressing Goal: Knowledge of secondary prevention will improve (MUST DOCUMENT ALL) 03/29/2024 1520 by Waynette Buttery, RN Outcome: Progressing 03/29/2024 1519 by Waynette Buttery, RN Outcome: Progressing Goal: Knowledge of patient specific risk factors will improve (DELETE if not current risk factor) 03/29/2024 1520 by Waynette Buttery, RN Outcome: Progressing 03/29/2024 1519 by Waynette Buttery, RN Outcome: Progressing   Problem: Ischemic Stroke/TIA Tissue Perfusion: Goal: Complications of ischemic stroke/TIA will be minimized 03/29/2024 1520 by Waynette Buttery, RN Outcome: Progressing 03/29/2024 1519 by Waynette Buttery, RN Outcome: Progressing   Problem: Coping: Goal: Will verbalize positive feelings about self 03/29/2024 1520 by Waynette Buttery, RN Outcome: Progressing 03/29/2024 1519 by Waynette Buttery, RN Outcome: Progressing Goal: Will identify appropriate support needs 03/29/2024 1520 by Waynette Buttery, RN Outcome: Progressing 03/29/2024 1519 by Waynette Buttery, RN Outcome: Progressing   Problem: Health Behavior/Discharge Planning: Goal: Ability to manage health-related needs will improve 03/29/2024 1520 by Waynette Buttery, RN Outcome: Progressing 03/29/2024 1519 by Waynette Buttery, RN Outcome: Progressing Goal: Goals will be collaboratively established with patient/family 03/29/2024 1520 by Waynette Buttery, RN Outcome: Progressing 03/29/2024 1519 by Waynette Buttery, RN Outcome: Progressing   Problem: Self-Care: Goal: Ability to participate in self-care as condition permits will improve 03/29/2024 1520 by Waynette Buttery, RN Outcome: Progressing 03/29/2024 1519 by Waynette Buttery, RN Outcome: Progressing Goal: Verbalization of feelings and concerns over difficulty with self-care will improve 03/29/2024 1520 by Waynette Buttery, RN Outcome:  Progressing 03/29/2024 1519 by Waynette Buttery, RN Outcome: Progressing Goal: Ability to communicate needs accurately will improve 03/29/2024 1520 by Waynette Buttery, RN Outcome: Progressing 03/29/2024 1519 by Waynette Buttery, RN Outcome: Progressing   Problem: Nutrition: Goal: Risk of aspiration will decrease 03/29/2024 1520 by Waynette Buttery, RN Outcome: Progressing 03/29/2024 1519 by Waynette Buttery, RN Outcome: Progressing Goal: Dietary intake will improve 03/29/2024 1520 by Waynette Buttery, RN Outcome: Progressing 03/29/2024 1519 by Waynette Buttery, RN Outcome: Progressing   Problem: Education: Goal: Knowledge of General Education information will improve Description: Including pain rating scale, medication(s)/side effects and non-pharmacologic comfort measures 03/29/2024 1520 by Waynette Buttery, RN Outcome: Progressing 03/29/2024 1519 by Waynette Buttery, RN Outcome: Progressing   Problem: Health Behavior/Discharge Planning: Goal: Ability to manage health-related needs will improve 03/29/2024 1520 by Waynette Buttery, RN Outcome: Progressing 03/29/2024 1519 by Waynette Buttery, RN Outcome: Progressing   Problem: Clinical Measurements: Goal: Ability to maintain clinical measurements within normal limits will improve 03/29/2024 1520 by Waynette Buttery, RN Outcome: Progressing 03/29/2024 1519 by Waynette Buttery, RN Outcome: Progressing Goal: Will remain free from infection 03/29/2024 1520 by Waynette Buttery, RN Outcome: Progressing 03/29/2024 1519 by Waynette Buttery, RN Outcome: Progressing Goal: Diagnostic test results will improve 03/29/2024 1520 by Waynette Buttery, RN Outcome: Progressing 03/29/2024 1519 by Waynette Buttery, RN Outcome: Progressing Goal: Respiratory complications will improve 03/29/2024 1520 by Waynette Buttery, RN Outcome: Progressing 03/29/2024 1519 by Waynette Buttery, RN Outcome: Progressing Goal: Cardiovascular complication will be avoided 03/29/2024 1520 by Waynette Buttery, RN Outcome: Progressing 03/29/2024 1519  by Waynette Buttery, RN Outcome: Progressing   Problem: Activity: Goal: Risk for activity intolerance will decrease 03/29/2024 1520 by Waynette Buttery, RN Outcome: Progressing 03/29/2024 1519 by  Marasia Newhall A, RN Outcome: Progressing   Problem: Nutrition: Goal: Adequate nutrition will be maintained 03/29/2024 1520 by Waynette Buttery, RN Outcome: Progressing 03/29/2024 1519 by Waynette Buttery, RN Outcome: Progressing   Problem: Coping: Goal: Level of anxiety will decrease 03/29/2024 1520 by Temara Lanum A, RN Outcome: Progressing 03/29/2024 1519 by Waynette Buttery, RN Outcome: Progressing   Problem: Elimination: Goal: Will not experience complications related to bowel motility 03/29/2024 1520 by Waynette Buttery, RN Outcome: Progressing 03/29/2024 1519 by Waynette Buttery, RN Outcome: Progressing Goal: Will not experience complications related to urinary retention 03/29/2024 1520 by Waynette Buttery, RN Outcome: Progressing 03/29/2024 1519 by Waynette Buttery, RN Outcome: Progressing   Problem: Pain Managment: Goal: General experience of comfort will improve and/or be controlled 03/29/2024 1520 by Jaedah Lords A, RN Outcome: Progressing 03/29/2024 1519 by Waynette Buttery, RN Outcome: Progressing   Problem: Safety: Goal: Ability to remain free from injury will improve 03/29/2024 1520 by Waynette Buttery, RN Outcome: Progressing 03/29/2024 1519 by Waynette Buttery, RN Outcome: Progressing   Problem: Skin Integrity: Goal: Risk for impaired skin integrity will decrease 03/29/2024 1520 by Waynette Buttery, RN Outcome: Progressing 03/29/2024 1519 by Waynette Buttery, RN Outcome: Progressing

## 2024-03-29 NOTE — ED Notes (Signed)
 Talked with provider about pt HR. Provider would like to keep pt on the CCM, and to reach out if his BP drops, or if pt mental status gets worse.

## 2024-03-29 NOTE — ED Notes (Signed)
 Nurse noted pt HR was in the low 30's. Nurse did a EKG and got the NS on the unit to page the provider. PT family member said his HR has been dropping like that all day when he is resting. PT and family say the lowest it has gotten is in the 20's. PT GCS still 15 at this time.

## 2024-03-29 NOTE — Plan of Care (Signed)

## 2024-03-29 NOTE — Consult Note (Signed)
 CONSULTATION NOTE   Patient Name: Xavier Bell Date of Encounter: 03/29/2024 Cardiologist: Kristeen Miss, MD Electrophysiologist: None Advanced Heart Failure: None   Chief Complaint   Difficulty speaking  Patient Profile   83 yo male with a history of coronary artery disease status post CABG and remote atrial flutter with prior ablation in 2015, no longer on anticoagulation, who presented with difficulty speaking and found to have an acute stroke.  Overnight he was noted to have bradycardia with second-degree type II AV block.  Cardiology has been consulted.  HPI   Xavier Bell is a 83 y.o. male who is being seen today for the evaluation of second-degree AV block at the request of Xavier Junker, MD.  This is a pleasant 83 year old male patient of Dr. Elease Hashimoto with a history of coronary artery disease status post CABG in 2013 and atrial flutter status post ablation in 2015.  He was anticoagulated on Xarelto up until that time but has not been on anticoagulation afterwards.  He presented the emergency department with difficulty speaking and a code stroke was activated for aphasia.  Fortunately he had acute recovery.  Imaging suggested restricted diffusion defect along the posterior aspect of the left sylvian fissure extending to the atrium of the left lateral ventricle consistent with an acute infarct.  Additional small infarction were noted in the posterior left temporal lobe, left parietal lobe and anterior inferior left frontal lobe.  Additionally he was noted to be bradycardic overnight into the 20s and 30s.  Telemetry was personally reviewed and shows second-degree type II AV block with frequent dropped beats.  He has been on carvedilol long-term which has been held.  Today his heart rate has improved up into the 60s.  He is asymptomatic with this.  Denies any chest pain or shortness of breath.  PMHx   Past Medical History:  Diagnosis Date   Atrial flutter (HCC)    s/p  cardioversion 02/08/12; ablation by Dr Ladona Ridgel [phone]   Chronic anticoagulation    Coronary artery disease    s/p remote MI in 1994 with multiple PCI and subsequent CABG x 5 in 1996   Dyslipidemia    Hypertension    MI (myocardial infarction) (HCC) 1995   Normal nuclear stress test 2006   Pseudoaphakia    both eyes   Retinal detachment     Past Surgical History:  Procedure Laterality Date   ATRIAL FLUTTER ABLATION N/A 12/08/2014   CTI by Dr Ladona Ridgel   CARDIAC CATHETERIZATION  1996   CARDIOVERSION  02/08/2012   Procedure: CARDIOVERSION;  Surgeon: Elyn Aquas., MD;  Location: [facility] OR;  Service: Cardiovascular;  Laterality: N/A;   CORONARY ARTERY BYPASS GRAFT  1996   LIMA to LAD, SVG to RV marginal & PD, SVG  to DX and SVG to Ramus   ORIF ANKLE FRACTURE Left 12/10/2019   Procedure: Open Reduction Internal fixation (ORIF) Left Ankle Fracture;  Surgeon: Toni Arthurs, MD;  Location: [facility];  Service: Orthopedics;  Laterality: Left;   TONSILLECTOMY AND ADENOIDECTOMY      FAMHx   Family History  Problem Relation Age of Onset   Heart disease Mother    Heart disease Father    Coronary artery disease Other     SOCHx    reports that he has never smoked. He has never used smokeless tobacco. He reports that he does not drink alcohol and does not use drugs.  Outpatient Medications   No current facility-administered  medications on file prior to encounter.   Current Outpatient Medications on File Prior to Encounter  Medication Sig Dispense Refill   amLODipine (NORVASC) 10 MG tablet Take 1 tablet (10 mg total) by mouth daily. 90 tablet 3   aspirin 81 MG tablet Take 81 mg by mouth daily.     atorvastatin (LIPITOR) 40 MG tablet TAKE 1 TABLET EVERY DAY 90 tablet 3   brimonidine (ALPHAGAN) 0.2 % ophthalmic solution Place 1 drop into both eyes in the morning and at bedtime.     carvedilol (COREG) 12.5 MG tablet TAKE 1 TABLET TWICE DAILY 180 tablet 3   chlorthalidone  (HYGROTON) 25 MG tablet TAKE 1/2 TABLET EVERY DAY 45 tablet 3   Cholecalciferol (VITAMIN D) 2000 units tablet Take 2,000 Units by mouth daily.     dorzolamide-timolol (COSOPT) 22.3-6.8 MG/ML ophthalmic solution Place 2 drops into both eyes 2 (two) times daily.     fenofibrate (TRICOR) 145 MG tablet TAKE 1 TABLET EVERY DAY 90 tablet 3   latanoprost (XALATAN) 0.005 % ophthalmic solution Place 1 drop into both eyes at bedtime.      Multiple Vitamin (MULTIVITAMIN) tablet Take 1 tablet by mouth daily.     Omega-3 Fatty Acids (OMEGA 3 PO) Take 1 capsule by mouth daily.     potassium chloride SA (KLOR-CON M) 20 MEQ tablet Take 1 tablet (20 mEq total) by mouth daily. 90 tablet 3   valsartan (DIOVAN) 320 MG tablet TAKE 1 TABLET EVERY DAY 90 tablet 3   Zinc 50 MG CAPS Take 50 mg by mouth daily.      Inpatient Medications    Scheduled Meds:   stroke: early stages of recovery book   Does not apply Once   aspirin  81 mg Oral Daily   atorvastatin  40 mg Oral Daily   brimonidine  1 drop Both Eyes BID   clopidogrel  75 mg Oral Daily   dorzolamide-timolol  2 drop Both Eyes BID   enoxaparin (LOVENOX) injection  40 mg Subcutaneous Q24H   fenofibrate  160 mg Oral Daily   latanoprost  1 drop Both Eyes QHS    Continuous Infusions:   PRN Meds: acetaminophen **OR** acetaminophen (TYLENOL) oral liquid 160 mg/5 mL **OR** acetaminophen, senna-docusate   ALLERGIES   Allergies  Allergen Reactions   Niacin And Related Other (See Comments)    Stomach Pain and flushing     ROS   Pertinent items noted in HPI and remainder of comprehensive ROS otherwise negative.  Vitals   Vitals:   03/29/24 0305 03/29/24 0500 03/29/24 0600 03/29/24 0711  BP: 95/63 (!) 140/80 120/68   Pulse: (!) 56 (!) 53 (!) 34   Resp: 18 18 16    Temp:    98.3 F (36.8 C)  TempSrc:    Oral  SpO2: 98% 96% 98%    No intake or output data in the 24 hours ending 03/29/24 0821 There were no vitals filed for this  visit.  Physical Exam   General appearance: alert and no distress Neck: no carotid bruit, no JVD, and thyroid not enlarged, symmetric, no tenderness/mass/nodules Lungs: clear to auscultation bilaterally Heart: regular rate and rhythm, S1, S2 normal, no murmur, click, rub or gallop Abdomen: soft, non-tender; bowel sounds normal; no masses,  no organomegaly Extremities: extremities normal, atraumatic, no cyanosis or edema Pulses: 2+ and symmetric Skin: Skin color, texture, turgor normal. No rashes or lesions Neurologic: Alert and oriented X 3, normal strength and tone. Normal symmetric reflexes.  Normal coordination and gait Psych: Pleasant, speech normal  Labs   Results for orders placed or performed during the hospital encounter of 03/28/24 (from the past 48 hours)  CBG monitoring, ED     Status: Abnormal   Collection Time: 03/28/24  9:42 AM  Result Value Ref Range   Glucose-Capillary 120 (H) 70 - 99 mg/dL    Comment: Glucose reference range applies only to samples taken after fasting for at least 8 hours.  Comprehensive metabolic panel     Status: Abnormal   Collection Time: 03/28/24  9:44 AM  Result Value Ref Range   Sodium 137 135 - 145 mmol/L   Potassium 3.6 3.5 - 5.1 mmol/L   Chloride 104 98 - 111 mmol/L   CO2 20 (L) 22 - 32 mmol/L   Glucose, Bld 113 (H) 70 - 99 mg/dL    Comment: Glucose reference range applies only to samples taken after fasting for at least 8 hours.   BUN 33 (H) 8 - 23 mg/dL   Creatinine, Ser 5.28 (H) 0.61 - 1.24 mg/dL   Calcium 9.6 8.9 - 41.3 mg/dL   Total Protein 6.4 (L) 6.5 - 8.1 g/dL   Albumin 3.8 3.5 - 5.0 g/dL   AST 39 15 - 41 U/L   ALT 26 0 - 44 U/L   Alkaline Phosphatase 67 38 - 126 U/L   Total Bilirubin 1.0 0.0 - 1.2 mg/dL   GFR, Estimated 51 (L) >60 mL/min    Comment: (NOTE) Calculated using the CKD-EPI Creatinine Equation (2021)    Anion gap 13 5 - 15    Comment: Performed at [facility], [address], [place], [place] [address]   CBC     Status: Abnormal   Collection Time: 03/28/24  9:44 AM  Result Value Ref Range   WBC 6.2 4.0 - 10.5 K/uL   RBC 3.86 (L) 4.22 - 5.81 MIL/uL   Hemoglobin 12.3 (L) 13.0 - 17.0 g/dL   HCT 02.7 (L) 25.3 - 66.4 %   MCV 96.6 80.0 - 100.0 fL   MCH 31.9 26.0 - 34.0 pg   MCHC 33.0 30.0 - 36.0 g/dL   RDW 40.3 47.4 - 25.9 %   Platelets 295 150 - 400 K/uL   nRBC 0.0 0.0 - 0.2 %    Comment: Performed at [facility], [address] [place], [place] [address]  Ethanol     Status: None   Collection Time: 03/28/24  9:44 AM  Result Value Ref Range   Alcohol, Ethyl (B) <10 <10 mg/dL    Comment: (NOTE) Lowest detectable limit for serum alcohol is 10 mg/dL.  For medical purposes only. Performed at [facility], [address], [place], [place] [address]   Protime-INR     Status: None   Collection Time: 03/28/24  9:44 AM  Result Value Ref Range   Prothrombin Time 15.1 11.4 - 15.2 seconds   INR 1.2 0.8 - 1.2    Comment: (NOTE) INR goal varies based on device and disease states. Performed at [facility], [address], [place], [place] [address]   APTT     Status: None   Collection Time: 03/28/24  9:44 AM  Result Value Ref Range   aPTT 29 24 - 36 seconds    Comment: Performed at [facility], [address] [place], [place] [address]  Differential     Status: None   Collection Time: 03/28/24  9:44  AM  Result Value Ref Range   Neutrophils Relative % 68 %   Neutro Abs 4.3 1.7 - 7.7 K/uL   Lymphocytes Relative 18 %   Lymphs Abs 1.1 0.7 - 4.0 K/uL   Monocytes Relative 12 %   Monocytes Absolute 0.7 0.1 - 1.0 K/uL   Eosinophils Relative 0 %   Eosinophils Absolute 0.0 0.0 - 0.5 K/uL   Basophils Relative 1 %   Basophils Absolute 0.0 0.0 - 0.1 K/uL   Immature Granulocytes 1 %   Abs Immature Granulocytes 0.04 0.00 - 0.07 K/uL    Comment: Performed at [facility], [address] [place], [place] [address]  Troponin I (High Sensitivity)     Status:  Abnormal   Collection Time: 03/28/24  9:44 AM  Result Value Ref Range   Troponin I (High Sensitivity) 32 (H) <18 ng/L    Comment: (NOTE) Elevated high sensitivity troponin I (hsTnI) values and significant  changes across serial measurements may suggest ACS but many other  chronic and acute conditions are known to elevate hsTnI results.  Refer to the "Links" section for chest pain algorithms and additional  guidance. Performed at [facility], [address], [place], [place] [address]   Phosphorus     Status: None   Collection Time: 03/28/24  9:44 AM  Result Value Ref Range   Phosphorus 3.3 2.5 - 4.6 mg/dL    Comment: Performed at [facility], [address], [place], [place] [address]  I-stat chem 8, ED     Status: Abnormal   Collection Time: 03/28/24 10:07 AM  Result Value Ref Range   Sodium 140 135 - 145 mmol/L   Potassium 4.0 3.5 - 5.1 mmol/L   Chloride 106 98 - 111 mmol/L   BUN 32 (H) 8 - 23 mg/dL   Creatinine, Ser 9.14 (H) 0.61 - 1.24 mg/dL   Glucose, Bld 782 (H) 70 - 99 mg/dL    Comment: Glucose reference range applies only to samples taken after fasting for at least 8 hours.   Calcium, Ion 1.22 1.15 - 1.40 mmol/L   TCO2 21 (L) 22 - 32 mmol/L   Hemoglobin 11.9 (L) 13.0 - 17.0 g/dL   HCT 95.6 (L) 21.3 - 08.6 %  Hemoglobin A1c     Status: Abnormal   Collection Time: 03/28/24 10:51 AM  Result Value Ref Range   Hgb A1c MFr Bld 5.8 (H) 4.8 - 5.6 %    Comment: (NOTE) Pre diabetes:          5.7%-6.4%  Diabetes:              >6.4%  Glycemic control for   <7.0% adults with diabetes    Mean Plasma Glucose 119.76 mg/dL    Comment: Performed at [facility], [address] [place], [place] [address]  Urine rapid drug screen (hosp performed)     Status: None   Collection Time: 03/28/24 12:48 PM  Result Value Ref Range   Opiates NONE DETECTED NONE DETECTED   Cocaine NONE DETECTED NONE DETECTED   Benzodiazepines NONE DETECTED NONE DETECTED    Amphetamines NONE DETECTED NONE DETECTED   Tetrahydrocannabinol NONE DETECTED NONE DETECTED   Barbiturates NONE DETECTED NONE DETECTED    Comment: (NOTE) DRUG SCREEN FOR MEDICAL PURPOSES ONLY.  IF CONFIRMATION IS NEEDED FOR ANY PURPOSE, NOTIFY LAB WITHIN 5 DAYS.  LOWEST DETECTABLE LIMITS FOR URINE DRUG SCREEN Drug Class  Cutoff (ng/mL) Amphetamine and metabolites    1000 Barbiturate and metabolites    200 Benzodiazepine                 200 Opiates and metabolites        300 Cocaine and metabolites        300 THC                            50 Performed at [facility], [address], [place], [place] [address]   Urinalysis, Routine w reflex microscopic -Urine, Unspecified Source     Status: Abnormal   Collection Time: 03/28/24 12:48 PM  Result Value Ref Range   Color, Urine YELLOW YELLOW   APPearance CLEAR CLEAR   Specific Gravity, Urine 1.019 1.005 - 1.030   pH 7.0 5.0 - 8.0   Glucose, UA NEGATIVE NEGATIVE mg/dL   Hgb urine dipstick SMALL (A) NEGATIVE   Bilirubin Urine NEGATIVE NEGATIVE   Ketones, ur NEGATIVE NEGATIVE mg/dL   Protein, ur NEGATIVE NEGATIVE mg/dL   Nitrite NEGATIVE NEGATIVE   Leukocytes,Ua NEGATIVE NEGATIVE   RBC / HPF 0-5 0 - 5 RBC/hpf   WBC, UA 0-5 0 - 5 WBC/hpf   Bacteria, UA NONE SEEN NONE SEEN   Squamous Epithelial / HPF 0-5 0 - 5 /HPF    Comment: Performed at [facility], [address], [place], [place] [address]  Lipid panel     Status: Abnormal   Collection Time: 03/29/24  5:13 AM  Result Value Ref Range   Cholesterol 92 0 - 200 mg/dL   Triglycerides 621 <308 mg/dL   HDL 25 (L) >65 mg/dL   Total CHOL/HDL Ratio 3.7 RATIO   VLDL 21 0 - 40 mg/dL   LDL Cholesterol 46 0 - 99 mg/dL    Comment:        Total Cholesterol/HDL:CHD Risk Coronary Heart Disease Risk Table                     Men   Women  1/2 Average Risk   3.4   3.3  Average Risk       5.0   4.4  2 X Average Risk   9.6   7.1  3 X Average Risk  23.4    11.0        Use the calculated Patient Ratio above and the CHD Risk Table to determine the patient's CHD Risk.        ATP III CLASSIFICATION (LDL):  <100     mg/dL   Optimal  784-696  mg/dL   Near or Above                    Optimal  130-159  mg/dL   Borderline  295-284  mg/dL   High  >132     mg/dL   Very High Performed at [facility], [address], [place], [place] [address]   CBC     Status: Abnormal   Collection Time: 03/29/24  5:13 AM  Result Value Ref Range   WBC 7.4 4.0 - 10.5 K/uL   RBC 3.42 (L) 4.22 - 5.81 MIL/uL   Hemoglobin 11.1 (L) 13.0 - 17.0 g/dL   HCT 27.2 (L) 53.6 - 64.4 %   MCV 95.3 80.0 - 100.0 fL   MCH 32.5 26.0 - 34.0 pg   MCHC 34.0 30.0 - 36.0  g/dL   RDW 16.1 09.6 - 04.5 %   Platelets 267 150 - 400 K/uL   nRBC 0.0 0.0 - 0.2 %    Comment: Performed at [facility], [address], [place], [place] [address]  Basic metabolic panel     Status: Abnormal   Collection Time: 03/29/24  5:13 AM  Result Value Ref Range   Sodium 139 135 - 145 mmol/L   Potassium 3.6 3.5 - 5.1 mmol/L   Chloride 107 98 - 111 mmol/L   CO2 21 (L) 22 - 32 mmol/L   Glucose, Bld 105 (H) 70 - 99 mg/dL    Comment: Glucose reference range applies only to samples taken after fasting for at least 8 hours.   BUN 28 (H) 8 - 23 mg/dL   Creatinine, Ser 1.91 (H) 0.61 - 1.24 mg/dL   Calcium 8.9 8.9 - 47.8 mg/dL   GFR, Estimated 55 (L) >60 mL/min    Comment: (NOTE) Calculated using the CKD-EPI Creatinine Equation (2021)    Anion gap 11 5 - 15    Comment: Performed at [facility], [address] [place], [place] [address]    ECG   Second-degree type II AV block- Personally Reviewed  Telemetry   Sinus rhythm with periods of second-degree type II AV block- Personally Reviewed  Radiology   MR BRAIN WO CONTRAST Result Date: 03/28/2024 CLINICAL DATA:  Stroke, follow-up. EXAM: MRI HEAD WITHOUT CONTRAST TECHNIQUE: Multiplanar, multiecho pulse sequences of the brain and  surrounding structures were obtained without intravenous contrast. COMPARISON:  None Available. FINDINGS: Brain: Restricted diffusion is present along the posterior aspect of the left sylvian fissure extending to the atrium of the left lateral ventricle. A 6 mm cortical infarct present in the posterior left temporal lobe. A 5 mm cortical infarct is present the anterior inferior left frontal lobe. T2 and FLAIR hyperintensities are associated with several of the infarcts. The changes are more subtle in the posterior left sylvian fissure infarct. No acute hemorrhage or mass lesion is present. The ventricles are of normal size. No significant extraaxial fluid collection is present. The brainstem and cerebellum are within normal limits. Midline structures are within normal limits. Vascular: Flow is present in the major intracranial arteries. Skull and upper cervical spine: The craniocervical junction is normal. Upper cervical spine is within normal limits. Marrow signal is unremarkable. Sinuses/Orbits: The paranasal sinuses and mastoid air cells are clear. Bilateral lens replacements are noted. Globes and orbits are otherwise unremarkable. IMPRESSION: 1. Restricted diffusion along the posterior aspect of the left Sylvian fissure extending to the atrium of the left lateral ventricle consistent with acute infarct. 2. Additional small infarcts involving the posterior left temporal lobe, left parietal lobe and anterior inferior left frontal lobe. 3. No acute hemorrhage or mass lesion. Electronically Signed   By: Marin Roberts M.D.   On: 03/28/2024 13:03   CT Angio Head Neck W WO CM (CODE STROKE) Result Date: 03/28/2024 CLINICAL DATA:  Neuro deficit, acute, stroke suspected.  Aphasia. EXAM: CT ANGIOGRAPHY HEAD AND NECK WITH AND WITHOUT CONTRAST TECHNIQUE: Multidetector CT imaging of the head and neck was performed using the standard protocol during bolus administration of intravenous contrast. Multiplanar CT image  reconstructions and MIPs were obtained to evaluate the vascular anatomy. Carotid stenosis measurements (when applicable) are obtained utilizing NASCET criteria, using the distal internal carotid diameter as the denominator. RADIATION DOSE REDUCTION: This exam was performed according to the departmental dose-optimization program which includes automated exposure  control, adjustment of the mA and/or kV according to patient size and/or use of iterative reconstruction technique. CONTRAST:  75mL OMNIPAQUE IOHEXOL 350 MG/ML SOLN COMPARISON:  CT head without contrast 03/28/2024. FINDINGS: CTA NECK FINDINGS Aortic arch: Atherosclerotic calcifications are present at the aortic arch. Calcifications are present at the origin of the left subclavian artery and left common carotid artery without significant stenosis. No aneurysm is present. Right carotid system: The right common carotid artery is within normal limits. Dense calcifications are present at the proximal right ICA lumen is narrowed to 2 mm. This compares to more normal distal lumen of 5 mm. Left carotid system: The left common carotid artery is within normal limits. Dense calcifications are present at the left carotid bifurcation without a significant stenosis relative to the more distal vessel. The cervical left ICA is otherwise within normal limits. Vertebral arteries: The right vertebral artery is dominant vessel. Dense calcifications are present at the origins of both vertebral arteries. 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 The lumen is narrowed to 1.4 mm. Severe left scratched at severe stenosis is present at the origin of the left vertebral artery. No tandem stenoses are present in either vertebral artery in the neck. Skeleton: Multilevel degenerative changes are present within the cervical spine. Grade 1 degenerative anterolisthesis is present C4-5. Chronic endplate degenerative changes present C5-6 6 and C6-7. No focal osseous  lesions are present. Other neck: The soft tissues of the neck are otherwise unremarkable. Salivary glands are within normal limits. Thyroid is normal. No significant adenopathy is present. No focal mucosal or submucosal lesions are present. Upper chest: The lung apices are clear. The thoracic inlet is within normal limits. Review of the MIP images confirms the above findings CTA HEAD FINDINGS Anterior circulation: Atherosclerotic calcifications are present within the cavernous internal carotid arteries without significant stenosis through the ICA termini. The A1 and M1 segments are normal. The anterior communicating artery is patent. The MCA bifurcations are within normal limits. The ACA and MCA branch vessels are normal. No aneurysm is present. Posterior circulation: The right vertebral artery is dominant. Atherosclerotic calcifications are present at the dural margin of the right vertebral artery without a significant stenosis relative to the more distal vessel. The PICA origins are visualized and normal. Vertebrobasilar junction and basilar artery normal. The superior cerebellar arteries are patent. The right posterior cerebral artery originates from basilar tip. The left posterior cerebral artery is of fetal type. Small left P1 segment is noted. The PCA branch vessels are within normal limits bilaterally. No aneurysm is present. Venous sinuses: The dural sinuses are patent. The straight sinus and deep cerebral veins are intact. Cortical veins are within normal limits. No significant vascular malformation is evident. Anatomic variants: Fetal type left posterior cerebral artery. Review of the MIP images confirms the above findings IMPRESSION: 1. 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 2. 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 3. Severe stenosis at the origin of the left vertebral artery. 4. Dense calcifications at the left carotid bifurcation without a significant stenosis  relative to the more distal vessel. 5. Atherosclerotic calcifications within the cavernous internal carotid arteries without significant stenosis through the ICA termini. 6. No significant proximal stenosis, aneurysm, or branch vessel occlusion within the Circle of Willis. 7. Multilevel degenerative changes of the cervical spine. Electronically Signed   By: Marin Roberts M.D.   On: 03/28/2024 10:42   CT HEAD CODE STROKE WO CONTRAST Result Date: 03/28/2024  CLINICAL DATA:  Code stroke.  Aphasia. EXAM: CT HEAD WITHOUT CONTRAST TECHNIQUE: Contiguous axial images were obtained from the base of the skull through the vertex without intravenous contrast. RADIATION DOSE REDUCTION: This exam was performed according to the departmental dose-optimization program which includes automated exposure control, adjustment of the mA and/or kV according to patient size and/or use of iterative reconstruction technique. COMPARISON:  No acute infarct, hemorrhage, FINDINGS: Brain: No acute infarct, hemorrhage, or mass lesion is present. Mild atrophy and white matter changes are noted bilaterally. Deep brain nuclei are within normal limits. The ventricles are of normal size. No significant extraaxial fluid collection is present. The brainstem and cerebellum are within normal limits. Midline structures are within normal limits. Vascular: Atherosclerotic calcifications are present within the cavernous internal carotid arteries bilaterally. Calcifications are present at the dural margin both vertebral arteries, right greater than left. No hyperdense vessel is present. Skull: Calvarium is intact. No focal lytic or blastic lesions are present. No significant extracranial soft tissue lesion is present. Sinuses/Orbits: Chronic right maxillary sinusitis is present. Diffuse wall thickening is present. Less prominent wall thickening is present on the left without active sinus disease. Mild mucosal thickening is present in the ethmoid air  cells bilaterally. ASPECTS [facility] Stroke Program Early CT Score) - Ganglionic level infarction (caudate, lentiform nuclei, internal capsule, insula, M1-M3 cortex): 7/7 - Supraganglionic infarction (M4-M6 cortex): 3/3 Total score (0-10 with 10 being normal): 10/10 IMPRESSION: 1. No acute intracranial abnormality or significant interval change. 2. Aspects is 10/10. 3. Mild atrophy and white matter disease likely reflects the sequela of chronic microvascular ischemia. 4. Chronic right maxillary sinusitis. The above was relayed via text pager to Dr. Tollie Eth on 03/28/2024 at 10:22 . Electronically Signed   By: Marin Roberts M.D.   On: 03/28/2024 10:23    Cardiac Studies   N/A  Impression   Principal Problem:   Stroke (cerebrum) (HCC) Active Problems:   Hypertension   Glaucoma   Second degree AV block, Mobitz type II   Recommendation   Mr. Teale had presented with acute aphasia which has fortunately resolved however he was noted overnight to have bradycardia and second-degree AV block.  Today this is largely improved.  He is on carvedilol 12.5 mg twice daily.  Would obviously recommend holding that and any AV nodal blocking agents.  An echocardiogram is ordered.  He was started on Plavix in addition to aspirin after loading dose as recommended by neurology.  There does not appear to be any evidence of atrial flutter or fibrillation.  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  I did not feel the AV block and stroke would necessarily be related unless there is perhaps some new LV dysfunction.  He denies any anginal symptoms.  Thanks for the consultation.  Cardiology will follow with you.  Time Spent Directly with Patient:  I have spent a total of 45 minutes with the patient reviewing hospital notes, telemetry, EKGs, labs and examining the patient as well as  establishing an assessment and plan that was discussed personally with the patient.  > 50% of time was spent in direct patient care.  Length of Stay:  LOS: 1 day   Chrystie Nose, MD, [facility], FACP  [place]  [facility]  Medical Director of the [facility]  Cardiovascular [facility] Reduction [facility] Diplomate of the American Board  of Clinical Lipidology Attending Cardiologist  Direct Dial: [phone]  Fax: [phone]  Website:  [email or web address]   Lisette Abu Solita Macadam 03/29/2024, 8:21 AM

## 2024-03-30 ENCOUNTER — Telehealth (HOSPITAL_COMMUNITY): Payer: Self-pay | Admitting: Pharmacy Technician

## 2024-03-30 ENCOUNTER — Inpatient Hospital Stay (HOSPITAL_COMMUNITY)

## 2024-03-30 ENCOUNTER — Other Ambulatory Visit (HOSPITAL_COMMUNITY): Payer: Self-pay

## 2024-03-30 DIAGNOSIS — R29700 NIHSS score 0: Secondary | ICD-10-CM | POA: Diagnosis not present

## 2024-03-30 DIAGNOSIS — I6389 Other cerebral infarction: Secondary | ICD-10-CM | POA: Diagnosis not present

## 2024-03-30 DIAGNOSIS — I480 Paroxysmal atrial fibrillation: Secondary | ICD-10-CM | POA: Diagnosis not present

## 2024-03-30 DIAGNOSIS — I63212 Cerebral infarction due to unspecified occlusion or stenosis of left vertebral arteries: Secondary | ICD-10-CM | POA: Diagnosis not present

## 2024-03-30 DIAGNOSIS — E785 Hyperlipidemia, unspecified: Secondary | ICD-10-CM | POA: Diagnosis not present

## 2024-03-30 DIAGNOSIS — R4701 Aphasia: Secondary | ICD-10-CM

## 2024-03-30 DIAGNOSIS — I63132 Cerebral infarction due to embolism of left carotid artery: Secondary | ICD-10-CM | POA: Diagnosis not present

## 2024-03-30 DIAGNOSIS — I739 Peripheral vascular disease, unspecified: Secondary | ICD-10-CM | POA: Diagnosis not present

## 2024-03-30 DIAGNOSIS — I4891 Unspecified atrial fibrillation: Secondary | ICD-10-CM

## 2024-03-30 DIAGNOSIS — Z7901 Long term (current) use of anticoagulants: Secondary | ICD-10-CM

## 2024-03-30 DIAGNOSIS — I441 Atrioventricular block, second degree: Secondary | ICD-10-CM | POA: Diagnosis not present

## 2024-03-30 HISTORY — DX: Aphasia: R47.01

## 2024-03-30 LAB — ECHOCARDIOGRAM COMPLETE
AR max vel: 1.02 cm2
AV Area VTI: 0.89 cm2
AV Area mean vel: 0.88 cm2
AV Mean grad: 6.8 mmHg
AV Peak grad: 13.8 mmHg
Ao pk vel: 1.86 m/s
Calc EF: 46.7 %
Est EF: 50
Height: 67 in
S' Lateral: 3.8 cm
Single Plane A2C EF: 50.3 %
Single Plane A4C EF: 45.6 %
Weight: 2497.6 [oz_av]

## 2024-03-30 LAB — TSH: TSH: 3.537 u[IU]/mL (ref 0.350–4.500)

## 2024-03-30 MED ORDER — APIXABAN 5 MG PO TABS
5.0000 mg | ORAL_TABLET | Freq: Two times a day (BID) | ORAL | Status: DC
Start: 2024-03-30 — End: 2024-03-30
  Administered 2024-03-30: 5 mg via ORAL
  Filled 2024-03-30: qty 1

## 2024-03-30 MED ORDER — APIXABAN 5 MG PO TABS
5.0000 mg | ORAL_TABLET | Freq: Two times a day (BID) | ORAL | 0 refills | Status: DC
  Filled 2024-03-30: qty 60, 30d supply, fill #0

## 2024-03-30 MED ORDER — PERFLUTREN LIPID MICROSPHERE
1.0000 mL | INTRAVENOUS | Status: AC | PRN
  Administered 2024-03-30: 2 mL via INTRAVENOUS

## 2024-03-30 NOTE — TOC Transition Note (Signed)
 Transition of Care [facility]) - Discharge Note   Patient Details  Name: Xavier Bell MRN: [id number] Date of Birth: 26-Nov-1941  [facility] of [facility] CM/SW Contact:  Gordy Clement, RN Phone Number: 03/30/2024, 3:54 PM   Clinical Narrative:     Patient will DC to h ome  Patient will follow up as directed in AVS and schedule his outpatient therapy  Family to transport  No additional TOC needs           Patient Goals and CMS Choice            Discharge Placement                       Discharge Plan and Services Additional resources added to the After Visit Summary for                                       Social Drivers of Health (SDOH) Interventions SDOH Screenings   Food Insecurity: No Food Insecurity (03/29/2024)  Housing: Low Risk  (03/29/2024)  Transportation Needs: No Transportation Needs (03/29/2024)  Utilities: Not At Risk (03/29/2024)  Alcohol Screen: Low Risk  (12/27/2023)  Depression (PHQ2-9): Low Risk  (12/27/2023)  Financial Resource Strain: Low Risk  (12/27/2023)  Physical Activity: Inactive (12/27/2023)  Social Connections: Moderately Isolated (03/29/2024)  Stress: No Stress Concern Present (12/27/2023)  Tobacco Use: Low Risk  (03/28/2024)  Health Literacy: Adequate Health Literacy (12/27/2023)     Readmission Risk Interventions     No data to display

## 2024-03-30 NOTE — Discharge Summary (Signed)
 Physician Discharge Summary  [name] EAV:[id number] DOB: [birth year] DOA: 03/28/2024  PCP: Georgina Quint, MD  Admit date: 03/28/2024 Discharge date: 03/30/2024  Admitted From: (Home) Disposition:  (Home)  Recommendations for Outpatient Follow-up:  Follow up with PCP in 1-2 weeks Please obtain BMP/CBC in one week Please follow on TSH results Patient will need sleep study as an outpatient   Brief/Interim Summary:  Xavier Bell is an 83 y.o. male with a history of atrial flutter s/p ablation 2015 not on anticoagulation, CAD s/p CABG, HTN, glaucoma who presented to the ED on 03/28/2024 due to speech abnormalities and right leg weakness impairing gait. Symptoms improved in the ED but work up with brain MRI showed acute infarct in the posterior aspect of the left Sylvian fissure extending to the atrium of the left ventricle and additional infarcts in the left posterior temporal, parietal and anterior inferior frontal lobes. CTA showed high grade stenoses in the proximal right and left vertebral arteries, 60% proximal right ICA stenosis, and left carotid calcification without stenosis. He was admitted with neurology consultation and overnight, despite holding beta blocker had bradycardia associated with 2nd degree type 2 AV block for which cardiology was consulted.    Acute CVA  - Acute ischemic Infarcts Posterior Left Sylvian fissure area, posterior left temporal lobe, left parietal lobe, anterior inferior left frontal lobe - etiology of stroke related to cardioembolic in the setting of paroxysmal A-fib, not on anticoagulation as status post ablation, overnight telemetry monitor showing evidence of A-fib, . - As well atheroembol from dense calcification of the L ICA at the origin with no significant stenosis  -Given evidence of A-fib, elevated CHADS2 Vascor, he was started on Eliquis. - Aspirin has been discontinued to increase risk of bleeding now he started on Eliquis -MRI brain   Restricted diffusion along the posterior aspect of the left Sylvian fissure extending to the atrium of the left lateral ventricle consistent with acute infarct. Additional small infarcts involving the posterior left temporal lobe, left parietal lobe and anterior inferior left frontal lobe. -2D echo with a EF 50% -Carotid US 04/2023 showing mild Disease Right Carotid, Moderate Plaque Left Carotid -LDL 46 -HgbA1c 5.8   2nd degree AV block, Mobitz II:  -Cardiology input greatly appreciated, continue to hold carvedilol -TSH is pending at time of discharge -Patient was monitored on telemetry, he is bradycardic in the upper 30s and 40s at night when he sleeps, but when he ambulates heart rate up in the 70s when he is symptomatic. -He will need sleep study as an outpatient as wife reports he snores  Paroxysmal A-fib, this is status post ablation in 2015 - Please see above discussion regarding starting liquids and stopping Coreg  Hypertension -Allow for permissive hypertension initially, Coreg has been discontinued, will resume antihypertensive medications tomorrow    CAD s/p CABG Hyperal anemia -Holding beta-blockers as above, aspirin discontinued as starting Eliquis, continue with statin   Glaucoma: Continue gtt's   Prediabetes: HbA1c 5.8%.  - Dietary modification  Hyperlipidemia - Controlled with LDL 46, continue with home statin on discharge  Discharge Diagnoses:  Principal Problem:   Stroke (cerebrum) (HCC) Active Problems:   Hypertension   PAF (paroxysmal atrial fibrillation) (HCC)   Glaucoma   Second degree AV block, Mobitz type II   Aphasia    Discharge Instructions  Discharge Instructions     Diet - low sodium heart healthy   Complete by: As directed    Discharge instructions   Complete  by: As directed    Follow with Primary MD Georgina Quint, MD in 7 days   Get CBC, CMP,  checked  by Primary MD next visit.    Activity: As tolerated with Full fall  precautions use walker/cane & assistance as needed   Disposition Home    Diet: Heart Healthy   On your next visit with your primary care physician please Get Medicines reviewed and adjusted.   Please request your Prim.MD to go over all Hospital Tests and Procedure/Radiological results at the follow up, please get all Hospital records sent to your Prim MD by signing hospital release before you go home.   If you experience worsening of your admission symptoms, develop shortness of breath, life threatening emergency, suicidal or homicidal thoughts you must seek medical attention immediately by calling 911 or calling your MD immediately  if symptoms less severe.  You Must read complete instructions/literature along with all the possible adverse reactions/side effects for all the Medicines you take and that have been prescribed to you. Take any new Medicines after you have completely understood and accpet all the possible adverse reactions/side effects.   Do not drive, operating heavy machinery, perform activities at heights, swimming or participation in water activities or provide baby sitting services if your were admitted for syncope or siezures until you have seen by Primary MD or a Neurologist and advised to do so again.  Do not drive when taking Pain medications.    Do not take more than prescribed Pain, Sleep and Anxiety Medications  Special Instructions: If you have smoked or chewed Tobacco  in the last 2 yrs please stop smoking, stop any regular Alcohol  and or any Recreational drug use.  Wear Seat belts while driving.   Please note  You were cared for by a hospitalist during your hospital stay. If you have any questions about your discharge medications or the care you received while you were in the hospital after you are discharged, you can call the unit and asked to speak with the hospitalist on call if the hospitalist that took care of you is not available. Once you are  discharged, your primary care physician will handle any further medical issues. Please note that NO REFILLS for any discharge medications will be authorized once you are discharged, as it is imperative that you return to your primary care physician (or establish a relationship with a primary care physician if you do not have one) for your aftercare needs so that they can reassess your need for medications and monitor your lab values.   Increase activity slowly   Complete by: As directed       Allergies as of 03/30/2024       Reactions   Niacin And Related Other (See Comments)   Stomach Pain and flushing        Medication List     STOP taking these medications    aspirin 81 MG tablet   carvedilol 12.5 MG tablet Commonly known as: COREG   chlorthalidone 25 MG tablet Commonly known as: HYGROTON       TAKE these medications    amLODipine 10 MG tablet Commonly known as: NORVASC Take 1 tablet (10 mg total) by mouth daily.   atorvastatin 40 MG tablet Commonly known as: LIPITOR TAKE 1 TABLET EVERY DAY   brimonidine 0.2 % ophthalmic solution Commonly known as: ALPHAGAN Place 1 drop into both eyes in the morning and at bedtime.   dorzolamide-timolol 2-0.5 % ophthalmic  solution Commonly known as: COSOPT Place 2 drops into both eyes 2 (two) times daily.   Eliquis 5 MG Tabs tablet Generic drug: apixaban Take 1 tablet (5 mg total) by mouth 2 (two) times daily.   fenofibrate 145 MG tablet Commonly known as: TRICOR TAKE 1 TABLET EVERY DAY   latanoprost 0.005 % ophthalmic solution Commonly known as: XALATAN Place 1 drop into both eyes at bedtime.   multivitamin tablet Take 1 tablet by mouth daily.   OMEGA 3 PO Take 1 capsule by mouth daily.   potassium chloride SA 20 MEQ tablet Commonly known as: KLOR-CON M Take 1 tablet (20 mEq total) by mouth daily.   valsartan 320 MG tablet Commonly known as: DIOVAN TAKE 1 TABLET EVERY DAY   Vitamin D 50 MCG (2000 UT)  tablet Take 2,000 Units by mouth daily.   Zinc 50 MG Caps Take 50 mg by mouth daily.        Allergies  Allergen Reactions   Niacin And Related Other (See Comments)    Stomach Pain and flushing     Consultations: cardiology Neurology   Procedures/Studies: ECHOCARDIOGRAM COMPLETE Result Date: 03/30/2024    ECHOCARDIOGRAM REPORT   Patient Name:   [facility] [place] Date of Exam: 03/30/2024 Medical Rec #:  [id number]          Height:       67.0 in Accession #:    [id number]         Weight:       156.1 lb Date of Birth:  [birth year]           BSA:          1.820 m Patient Age:    83 years           BP:           136/54 mmHg Patient Gender: M                  HR:           43 bpm. Exam Location:  Inpatient Procedure: 2D Echo, Color Doppler and Cardiac Doppler (Both Spectral and Color            Flow Doppler were utilized during procedure). Indications:    Stroke  History:        Patient has prior history of Echocardiogram examinations, most                 recent 11/23/2011.  Sonographer:    Maxwell Marion Referring Phys: [phone] DENISE A WOLFE IMPRESSIONS  1. Left ventricular ejection fraction, by estimation, is 50%. The left ventricle has low normal function. The left ventricle has no regional wall motion abnormalities. Left ventricular diastolic parameters are indeterminate.  2. Right ventricular systolic function is mildly reduced. The right ventricular size is mildly enlarged. Tricuspid regurgitation signal is inadequate for assessing PA pressure.  3. Left atrial size was mildly dilated.  4. The mitral valve is degenerative. Trivial mitral valve regurgitation. No evidence of mitral stenosis.  5. The aortic valve is grossly normal. There is mild calcification of the aortic valve. Aortic valve regurgitation is trivial. Aortic valve sclerosis/calcification is present, without any evidence of aortic stenosis.  6. The inferior vena cava is normal in size with greater than 50% respiratory variability,  suggesting right atrial pressure of 3 mmHg. FINDINGS  Left Ventricle: Left ventricular ejection fraction, by estimation, is 50%. The left ventricle has low normal function. The left ventricle has no  regional wall motion abnormalities. Definity contrast agent was given IV to delineate the left ventricular endocardial borders. The left ventricular internal cavity size was normal in size. There is no left ventricular hypertrophy. Abnormal (paradoxical) septal motion consistent with post-operative status. Left ventricular diastolic parameters are indeterminate. Right Ventricle: The right ventricular size is mildly enlarged. No increase in right ventricular wall thickness. Right ventricular systolic function is mildly reduced. Tricuspid regurgitation signal is inadequate for assessing PA pressure. Left Atrium: Left atrial size was mildly dilated. Right Atrium: Right atrial size was normal in size. Pericardium: There is no evidence of pericardial effusion. Mitral Valve: The mitral valve is degenerative in appearance. Trivial mitral valve regurgitation. No evidence of mitral valve stenosis. Tricuspid Valve: The tricuspid valve is normal in structure. Tricuspid valve regurgitation is trivial. No evidence of tricuspid stenosis. Aortic Valve: The aortic valve is grossly normal. There is mild calcification of the aortic valve. Aortic valve regurgitation is trivial. Aortic valve sclerosis/calcification is present, without any evidence of aortic stenosis. Aortic valve mean gradient  measures 6.8 mmHg. Aortic valve peak gradient measures 13.8 mmHg. Aortic valve area, by VTI measures 0.89 cm. Pulmonic Valve: The pulmonic valve was not well visualized. Pulmonic valve regurgitation is trivial. No evidence of pulmonic stenosis. Aorta: The aortic root is normal in size and structure. Venous: The inferior vena cava is normal in size with greater than 50% respiratory variability, suggesting right atrial pressure of 3 mmHg. IAS/Shunts:  The interatrial septum was not well visualized.  LEFT VENTRICLE PLAX 2D LVIDd:         4.20 cm      Diastology LVIDs:         3.80 cm      LV e' medial:    5.00 cm/s LV PW:         1.30 cm      LV E/e' medial:  24.2 LV IVS:        1.30 cm      LV e' lateral:   14.30 cm/s LVOT diam:     1.70 cm      LV E/e' lateral: 8.5 LV SV:         35 LV SV Index:   19 LVOT Area:     2.27 cm  LV Volumes (MOD) LV vol d, MOD A2C: 182.0 ml LV vol d, MOD A4C: 135.0 ml LV vol s, MOD A2C: 90.5 ml LV vol s, MOD A4C: 73.4 ml LV SV MOD A2C:     91.5 ml LV SV MOD A4C:     135.0 ml LV SV MOD BP:      73.9 ml RIGHT VENTRICLE RV S prime:     9.46 cm/s TAPSE (M-mode): 1.2 cm LEFT ATRIUM             Index LA diam:        4.60 cm 2.53 cm/m LA Vol (A2C):   62.9 ml 34.56 ml/m LA Vol (A4C):   68.4 ml 37.58 ml/m LA Biplane Vol: 66.6 ml 36.59 ml/m  AORTIC VALVE AV Area (Vmax):    1.02 cm AV Area (Vmean):   0.88 cm AV Area (VTI):     0.89 cm AV Vmax:           185.84 cm/s AV Vmean:          113.522 cm/s AV VTI:            0.400 m AV Peak Grad:      13.8 mmHg  AV Mean Grad:      6.8 mmHg LVOT Vmax:         83.50 cm/s LVOT Vmean:        44.100 cm/s LVOT VTI:          0.156 m LVOT/AV VTI ratio: 0.39  AORTA Ao Asc diam: 3.40 cm MV E velocity: 121.00 cm/s                             SHUNTS                             Systemic VTI:  0.16 m                             Systemic Diam: 1.70 cm Weston Brass MD Electronically signed by Weston Brass MD Signature Date/Time: 03/30/2024/12:42:44 PM    Final    MR BRAIN WO CONTRAST Result Date: 03/28/2024 CLINICAL DATA:  Stroke, follow-up. EXAM: MRI HEAD WITHOUT CONTRAST TECHNIQUE: Multiplanar, multiecho pulse sequences of the brain and surrounding structures were obtained without intravenous contrast. COMPARISON:  None Available. FINDINGS: Brain: Restricted diffusion is present along the posterior aspect of the left sylvian fissure extending to the atrium of the left lateral ventricle. A 6 mm cortical  infarct present in the posterior left temporal lobe. A 5 mm cortical infarct is present the anterior inferior left frontal lobe. T2 and FLAIR hyperintensities are associated with several of the infarcts. The changes are more subtle in the posterior left sylvian fissure infarct. No acute hemorrhage or mass lesion is present. The ventricles are of normal size. No significant extraaxial fluid collection is present. The brainstem and cerebellum are within normal limits. Midline structures are within normal limits. Vascular: Flow is present in the major intracranial arteries. Skull and upper cervical spine: The craniocervical junction is normal. Upper cervical spine is within normal limits. Marrow signal is unremarkable. Sinuses/Orbits: The paranasal sinuses and mastoid air cells are clear. Bilateral lens replacements are noted. Globes and orbits are otherwise unremarkable. IMPRESSION: 1. Restricted diffusion along the posterior aspect of the left Sylvian fissure extending to the atrium of the left lateral ventricle consistent with acute infarct. 2. Additional small infarcts involving the posterior left temporal lobe, left parietal lobe and anterior inferior left frontal lobe. 3. No acute hemorrhage or mass lesion. Electronically Signed   By: Marin Roberts M.D.   On: 03/28/2024 13:03   CT Angio Head Neck W WO CM (CODE STROKE) Result Date: 03/28/2024 CLINICAL DATA:  Neuro deficit, acute, stroke suspected.  Aphasia. EXAM: CT ANGIOGRAPHY HEAD AND NECK WITH AND WITHOUT CONTRAST TECHNIQUE: Multidetector CT imaging of the head and neck was performed using the standard protocol during bolus administration of intravenous contrast. Multiplanar CT image reconstructions and MIPs were obtained to evaluate the vascular anatomy. Carotid stenosis measurements (when applicable) are obtained utilizing NASCET criteria, using the distal internal carotid diameter as the denominator. RADIATION DOSE REDUCTION: This exam was performed  according to the departmental dose-optimization program which includes automated exposure control, adjustment of the mA and/or kV according to patient size and/or use of iterative reconstruction technique. CONTRAST:  75mL OMNIPAQUE IOHEXOL 350 MG/ML SOLN COMPARISON:  CT head without contrast 03/28/2024. FINDINGS: CTA NECK FINDINGS Aortic arch: Atherosclerotic calcifications are present at the aortic arch. Calcifications are present at the origin of the left subclavian artery  and left common carotid artery without significant stenosis. No aneurysm is present. Right carotid system: The right common carotid artery is within normal limits. Dense calcifications are present at the proximal right ICA lumen is narrowed to 2 mm. This compares to more normal distal lumen of 5 mm. Left carotid system: The left common carotid artery is within normal limits. Dense calcifications are present at the left carotid bifurcation without a significant stenosis relative to the more distal vessel. The cervical left ICA is otherwise within normal limits. Vertebral arteries: The right vertebral artery is dominant vessel. Dense calcifications are present at the origins of both vertebral arteries. 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 The lumen is narrowed to 1.4 mm. Severe left scratched at severe stenosis is present at the origin of the left vertebral artery. No tandem stenoses are present in either vertebral artery in the neck. Skeleton: Multilevel degenerative changes are present within the cervical spine. Grade 1 degenerative anterolisthesis is present C4-5. Chronic endplate degenerative changes present C5-6 6 and C6-7. No focal osseous lesions are present. Other neck: The soft tissues of the neck are otherwise unremarkable. Salivary glands are within normal limits. Thyroid is normal. No significant adenopathy is present. No focal mucosal or submucosal lesions are present. Upper chest: The lung apices are  clear. The thoracic inlet is within normal limits. Review of the MIP images confirms the above findings CTA HEAD FINDINGS Anterior circulation: Atherosclerotic calcifications are present within the cavernous internal carotid arteries without significant stenosis through the ICA termini. The A1 and M1 segments are normal. The anterior communicating artery is patent. The MCA bifurcations are within normal limits. The ACA and MCA branch vessels are normal. No aneurysm is present. Posterior circulation: The right vertebral artery is dominant. Atherosclerotic calcifications are present at the dural margin of the right vertebral artery without a significant stenosis relative to the more distal vessel. The PICA origins are visualized and normal. Vertebrobasilar junction and basilar artery normal. The superior cerebellar arteries are patent. The right posterior cerebral artery originates from basilar tip. The left posterior cerebral artery is of fetal type. Small left P1 segment is noted. The PCA branch vessels are within normal limits bilaterally. No aneurysm is present. Venous sinuses: The dural sinuses are patent. The straight sinus and deep cerebral veins are intact. Cortical veins are within normal limits. No significant vascular malformation is evident. Anatomic variants: Fetal type left posterior cerebral artery. Review of the MIP images confirms the above findings IMPRESSION: 1. 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 2. 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 3. Severe stenosis at the origin of the left vertebral artery. 4. Dense calcifications at the left carotid bifurcation without a significant stenosis relative to the more distal vessel. 5. Atherosclerotic calcifications within the cavernous internal carotid arteries without significant stenosis through the ICA termini. 6. No significant proximal stenosis, aneurysm, or branch vessel occlusion within the Circle of Willis.  7. Multilevel degenerative changes of the cervical spine. Electronically Signed   By: Marin Roberts M.D.   On: 03/28/2024 10:42   CT HEAD CODE STROKE WO CONTRAST Result Date: 03/28/2024 CLINICAL DATA:  Code stroke.  Aphasia. EXAM: CT HEAD WITHOUT CONTRAST TECHNIQUE: Contiguous axial images were obtained from the base of the skull through the vertex without intravenous contrast. RADIATION DOSE REDUCTION: This exam was performed according to the departmental dose-optimization program which includes automated exposure control, adjustment of the mA and/or kV according to patient  size and/or use of iterative reconstruction technique. COMPARISON:  No acute infarct, hemorrhage, FINDINGS: Brain: No acute infarct, hemorrhage, or mass lesion is present. Mild atrophy and white matter changes are noted bilaterally. Deep brain nuclei are within normal limits. The ventricles are of normal size. No significant extraaxial fluid collection is present. The brainstem and cerebellum are within normal limits. Midline structures are within normal limits. Vascular: Atherosclerotic calcifications are present within the cavernous internal carotid arteries bilaterally. Calcifications are present at the dural margin both vertebral arteries, right greater than left. No hyperdense vessel is present. Skull: Calvarium is intact. No focal lytic or blastic lesions are present. No significant extracranial soft tissue lesion is present. Sinuses/Orbits: Chronic right maxillary sinusitis is present. Diffuse wall thickening is present. Less prominent wall thickening is present on the left without active sinus disease. Mild mucosal thickening is present in the ethmoid air cells bilaterally. ASPECTS [facility] Stroke Program Early CT Score) - Ganglionic level infarction (caudate, lentiform nuclei, internal capsule, insula, M1-M3 cortex): 7/7 - Supraganglionic infarction (M4-M6 cortex): 3/3 Total score (0-10 with 10 being normal): 10/10 IMPRESSION:  1. No acute intracranial abnormality or significant interval change. 2. Aspects is 10/10. 3. Mild atrophy and white matter disease likely reflects the sequela of chronic microvascular ischemia. 4. Chronic right maxillary sinusitis. The above was relayed via text pager to Dr. Tollie Eth on 03/28/2024 at 10:22 . Electronically Signed   By: Marin Roberts M.D.   On: 03/28/2024 10:23      Subjective: No significant events overnight as discussed with staff, he denies any complaints today, eager to go home, as mentioned above he was noted to have A-fib overnight on telemetry monitor.  Discharge Exam: Vitals:   03/30/24 0950 03/30/24 1257  BP: (!) 145/67   Pulse: (!) 56 80  Resp: 15 17  Temp:    SpO2: 96% 95%   Vitals:   03/30/24 0400 03/30/24 0500 03/30/24 0950 03/30/24 1257  BP: 136/84  (!) 145/67   Pulse: (!) 55 60 (!) 56 80  Resp: (!) 23 13 15 17   Temp: 98.6 F (37 C)     TempSrc: Oral     SpO2: 95% 97% 96% 95%  Weight:      Height:        General: Pt is alert, awake, not in acute distress Cardiovascular: RRR, S1/S2 +, no rubs, no gallops Respiratory: CTA bilaterally, no wheezing, no rhonchi Abdominal: Soft, NT, ND, bowel sounds + Extremities: no edema, no cyanosis    The results of significant diagnostics from this hospitalization (including imaging, microbiology, ancillary and laboratory) are listed below for reference.     Microbiology: No results found for this or any previous visit (from the past 240 hours).   Labs: BNP (last 3 results) No results for input(s): "BNP" in the last 8760 hours. Basic Metabolic Panel: Recent Labs  Lab 03/28/24 0944 03/28/24 1007 03/29/24 0513  NA 137 140 139  K 3.6 4.0 3.6  CL 104 106 107  CO2 20*  --  21*  GLUCOSE 113* 111* 105*  BUN 33* 32* 28*  CREATININE 1.37* 1.40* 1.27*  CALCIUM 9.6  --  8.9  PHOS 3.3  --   --    Liver Function Tests: Recent Labs  Lab 03/28/24 0944  AST 39  ALT 26  ALKPHOS 67  BILITOT  1.0  PROT 6.4*  ALBUMIN 3.8   No results for input(s): "LIPASE", "AMYLASE" in the last 168 hours. No results for input(s): "AMMONIA"  in the last 168 hours. CBC: Recent Labs  Lab 03/28/24 0944 03/28/24 1007 03/29/24 0513  WBC 6.2  --  7.4  NEUTROABS 4.3  --   --   HGB 12.3* 11.9* 11.1*  HCT 37.3* 35.0* 32.6*  MCV 96.6  --  95.3  PLT 295  --  267   Cardiac Enzymes: No results for input(s): "CKTOTAL", "CKMB", "CKMBINDEX", "TROPONINI" in the last 168 hours. BNP: Invalid input(s): "POCBNP" CBG: Recent Labs  Lab 03/28/24 0942  GLUCAP 120*   D-Dimer No results for input(s): "DDIMER" in the last 72 hours. Hgb A1c Recent Labs    03/28/24 1051  HGBA1C 5.8*   Lipid Profile Recent Labs    03/29/24 0513  CHOL 92  HDL 25*  LDLCALC 46  TRIG 960  CHOLHDL 3.7   Thyroid function studies No results for input(s): "TSH", "T4TOTAL", "T3FREE", "THYROIDAB" in the last 72 hours.  Invalid input(s): "FREET3" Anemia work up No results for input(s): "VITAMINB12", "FOLATE", "FERRITIN", "TIBC", "IRON", "RETICCTPCT" in the last 72 hours. Urinalysis    Component Value Date/Time   COLORURINE YELLOW 03/28/2024 1248   APPEARANCEUR CLEAR 03/28/2024 1248   LABSPEC 1.019 03/28/2024 1248   PHURINE 7.0 03/28/2024 1248   GLUCOSEU NEGATIVE 03/28/2024 1248   HGBUR SMALL (A) 03/28/2024 1248   BILIRUBINUR NEGATIVE 03/28/2024 1248   KETONESUR NEGATIVE 03/28/2024 1248   PROTEINUR NEGATIVE 03/28/2024 1248   NITRITE NEGATIVE 03/28/2024 1248   LEUKOCYTESUR NEGATIVE 03/28/2024 1248   Sepsis Labs Recent Labs  Lab 03/28/24 0944 03/29/24 0513  WBC 6.2 7.4   Microbiology No results found for this or any previous visit (from the past 240 hours).   Time coordinating discharge: Over 30 minutes  SIGNED:   Huey Bienenstock, MD  [facility] 03/30/2024, 3:50 PM Pager   If 7PM-7AM, please contact night-coverage [email or web address] Password [email or web address]

## 2024-03-30 NOTE — Discharge Instructions (Signed)

## 2024-03-30 NOTE — Progress Notes (Signed)
 STROKE TEAM PROGRESS NOTE   BRIEF HPI [name] is a 83 y.o. male with history of atrial flutter not on anticoagulation (no recurrence in > 10 years s/p ablations), HTN, HLD, MI, CAD s/p CABG presenting with difficulty speaking noticed by his wife on the phone the morning of 4/5.  Patient was not offered TNK as he presented to the ED outside of the thrombolytic therapy time window.    SIGNIFICANT HOSPITAL EVENTS 4/5:  - Presentation to ED for difficulty speaking. CODE STROKE activated by EDP. NIH: 4  - Head CT with no acute process, aspects 10. - MRI brain with acute infarct of the posterior aspect of the left Sylvian fissure extending to the atrium of the left lateral ventricle with additional small infarcts involving the posterior left temporal, left parietal lobe, and anterior inferior left frontal lobe.  - CTA with 60% stenosis of the proximal right ICA, high-grade stenosis at the origin of the proximal right vertebral artery, severe stenosis at the origin of the left vertebral artery.  Dense calcifications at the left carotid bifurcation without a significant stenosis relative to the more distal vessel.  Atherosclerotic calcifications within the cavernous ICAs without significant stenosis through the ICA termini.  No significant proximal stenosis, aneurysm, or branch vessel occlusion within the circle of Willis.   INTERIM HISTORY/SUBJECTIVE  Patient is ambulating in the hallway with the therapist this morning.   Overnight his EKG shows atrial relation which has been confirmed by cardiology who recommend anticoagulation with Eliquis.  Echocardiogram was done results are pending. OBJECTIVE CBC    Component Value Date/Time   WBC 7.4 03/29/2024 0513   RBC 3.42 (L) 03/29/2024 0513   HGB 11.1 (L) 03/29/2024 0513   HGB 12.4 (L) 08/09/2017 1630   HCT 32.6 (L) 03/29/2024 0513   HCT 37.5 08/09/2017 1630   PLT 267 03/29/2024 0513   PLT 296 08/09/2017 1630   MCV 95.3 03/29/2024 0513    MCV 90 08/09/2017 1630   MCH 32.5 03/29/2024 0513   MCHC 34.0 03/29/2024 0513   RDW 12.9 03/29/2024 0513   RDW 13.9 08/09/2017 1630   LYMPHSABS 1.1 03/28/2024 0944   MONOABS 0.7 03/28/2024 0944   EOSABS 0.0 03/28/2024 0944   BASOSABS 0.0 03/28/2024 0944   BMET    Component Value Date/Time   NA 139 03/29/2024 0513   NA 140 11/05/2023 1538   K 3.6 03/29/2024 0513   CL 107 03/29/2024 0513   CO2 21 (L) 03/29/2024 0513   GLUCOSE 105 (H) 03/29/2024 0513   BUN 28 (H) 03/29/2024 0513   BUN 30 (H) 11/05/2023 1538   CREATININE 1.27 (H) 03/29/2024 0513   CREATININE 1.11 05/28/2016 0816   CALCIUM 8.9 03/29/2024 0513   EGFR 63 11/05/2023 1538   GFRNONAA 55 (L) 03/29/2024 0513   Lab Results  Component Value Date   HGBA1C 5.8 (H) 03/28/2024   Lab Results  Component Value Date   CHOL 92 03/29/2024   HDL 25 (L) 03/29/2024   LDLCALC 46 03/29/2024   TRIG 107 03/29/2024   CHOLHDL 3.7 03/29/2024   Drugs of Abuse     Component Value Date/Time   LABOPIA NONE DETECTED 03/28/2024 1248   COCAINSCRNUR NONE DETECTED 03/28/2024 1248   LABBENZ NONE DETECTED 03/28/2024 1248   AMPHETMU NONE DETECTED 03/28/2024 1248   THCU NONE DETECTED 03/28/2024 1248   LABBARB NONE DETECTED 03/28/2024 1248    IMAGING past 24 hours ECHOCARDIOGRAM COMPLETE Result Date: 03/30/2024    ECHOCARDIOGRAM  REPORT   Patient Name:   BARBARA KENG Bell Date of Exam: 03/30/2024 Medical Rec #:  [id number]          Height:       67.0 in Accession #:    [id number]         Weight:       156.1 lb Date of Birth:  06-Jun-1941           BSA:          1.820 m Patient Age:    83 years           BP:           136/54 mmHg Patient Gender: M                  HR:           43 bpm. Exam Location:  Inpatient Procedure: 2D Echo, Color Doppler and Cardiac Doppler (Both Spectral and Color            Flow Doppler were utilized during procedure). Indications:    Stroke  History:        Patient has prior history of Echocardiogram examinations, most                  recent 11/23/2011.  Sonographer:    Maxwell Marion Referring Phys: [phone] DENISE A WOLFE IMPRESSIONS  1. Left ventricular ejection fraction, by estimation, is 50%. The left ventricle has low normal function. The left ventricle has no regional wall motion abnormalities. Left ventricular diastolic parameters are indeterminate.  2. Right ventricular systolic function is mildly reduced. The right ventricular size is mildly enlarged. Tricuspid regurgitation signal is inadequate for assessing PA pressure.  3. Left atrial size was mildly dilated.  4. The mitral valve is degenerative. Trivial mitral valve regurgitation. No evidence of mitral stenosis.  5. The aortic valve is grossly normal. There is mild calcification of the aortic valve. Aortic valve regurgitation is trivial. Aortic valve sclerosis/calcification is present, without any evidence of aortic stenosis.  6. The inferior vena cava is normal in size with greater than 50% respiratory variability, suggesting right atrial pressure of 3 mmHg. FINDINGS  Left Ventricle: Left ventricular ejection fraction, by estimation, is 50%. The left ventricle has low normal function. The left ventricle has no regional wall motion abnormalities. Definity contrast agent was given IV to delineate the left ventricular endocardial borders. The left ventricular internal cavity size was normal in size. There is no left ventricular hypertrophy. Abnormal (paradoxical) septal motion consistent with post-operative status. Left ventricular diastolic parameters are indeterminate. Right Ventricle: The right ventricular size is mildly enlarged. No increase in right ventricular wall thickness. Right ventricular systolic function is mildly reduced. Tricuspid regurgitation signal is inadequate for assessing PA pressure. Left Atrium: Left atrial size was mildly dilated. Right Atrium: Right atrial size was normal in size. Pericardium: There is no evidence of pericardial effusion. Mitral Valve:  The mitral valve is degenerative in appearance. Trivial mitral valve regurgitation. No evidence of mitral valve stenosis. Tricuspid Valve: The tricuspid valve is normal in structure. Tricuspid valve regurgitation is trivial. No evidence of tricuspid stenosis. Aortic Valve: The aortic valve is grossly normal. There is mild calcification of the aortic valve. Aortic valve regurgitation is trivial. Aortic valve sclerosis/calcification is present, without any evidence of aortic stenosis. Aortic valve mean gradient  measures 6.8 mmHg. Aortic valve peak gradient measures 13.8 mmHg. Aortic valve area, by VTI measures 0.89 cm. Pulmonic Valve: The pulmonic  valve was not well visualized. Pulmonic valve regurgitation is trivial. No evidence of pulmonic stenosis. Aorta: The aortic root is normal in size and structure. Venous: The inferior vena cava is normal in size with greater than 50% respiratory variability, suggesting right atrial pressure of 3 mmHg. IAS/Shunts: The interatrial septum was not well visualized.  LEFT VENTRICLE PLAX 2D LVIDd:         4.20 cm      Diastology LVIDs:         3.80 cm      LV e' medial:    5.00 cm/s LV PW:         1.30 cm      LV E/e' medial:  24.2 LV IVS:        1.30 cm      LV e' lateral:   14.30 cm/s LVOT diam:     1.70 cm      LV E/e' lateral: 8.5 LV SV:         35 LV SV Index:   19 LVOT Area:     2.27 cm  LV Volumes (MOD) LV vol d, MOD A2C: 182.0 ml LV vol d, MOD A4C: 135.0 ml LV vol s, MOD A2C: 90.5 ml LV vol s, MOD A4C: 73.4 ml LV SV MOD A2C:     91.5 ml LV SV MOD A4C:     135.0 ml LV SV MOD BP:      73.9 ml RIGHT VENTRICLE RV S prime:     9.46 cm/s TAPSE (M-mode): 1.2 cm LEFT ATRIUM             Index LA diam:        4.60 cm 2.53 cm/m LA Vol (A2C):   62.9 ml 34.56 ml/m LA Vol (A4C):   68.4 ml 37.58 ml/m LA Biplane Vol: 66.6 ml 36.59 ml/m  AORTIC VALVE AV Area (Vmax):    1.02 cm AV Area (Vmean):   0.88 cm AV Area (VTI):     0.89 cm AV Vmax:           185.84 cm/s AV Vmean:           113.522 cm/s AV VTI:            0.400 m AV Peak Grad:      13.8 mmHg AV Mean Grad:      6.8 mmHg LVOT Vmax:         83.50 cm/s LVOT Vmean:        44.100 cm/s LVOT VTI:          0.156 m LVOT/AV VTI ratio: 0.39  AORTA Ao Asc diam: 3.40 cm MV E velocity: 121.00 cm/s                             SHUNTS                             Systemic VTI:  0.16 m                             Systemic Diam: 1.70 cm Weston Brass MD Electronically signed by Weston Brass MD Signature Date/Time: 03/30/2024/12:42:44 PM    Final     Vitals:   03/30/24 0400 03/30/24 0500 03/30/24 0950 03/30/24 1257  BP: 136/84  (!) 145/67   Pulse: (!) 55 60 [name])  56 80  Resp: (!) 23 13 15 17   Temp: 98.6 F (37 C)     TempSrc: Oral     SpO2: 95% 97% 96% 95%  Weight:      Height:       PHYSICAL EXAM General:  Alert, well-nourished, well-developed Caucasian male in no acute distress Psych:  Mood and affect appropriate for situation. Patient is calm and cooperative with exam CV: Bradycardia with secondary AV block, rate in the 50s Respiratory:  Regular, unlabored respirations on room air GI: Abdomen soft and nontender  NEURO:   Mental Status: AA&Ox3, patient is able to give clear and coherent history Speech/Language: speech is without dysarthria or aphasia.  Naming, repetition, fluency, and comprehension intact.  Cranial Nerves:  II: PERRL. Visual fields full.  III, IV, VI: EOMI. Eyelids elevate symmetrically.  V: Sensation is intact to light touch and symmetrical to face.  VII: Face is symmetrical resting and smiling VIII: Hearing is intact to voice. IX, X: Palate elevates symmetrically. Phonation is normal.  UJ:[id number] shrug 5/5. XII: Tongue protrudes midline Motor: 5/5 strength to all muscle groups tested.  Tone: is normal and bulk is normal Sensation: Intact to light touch bilaterally. Extinction absent to light touch to DSS.   Coordination: FTN intact bilaterally, HKS: no ataxia in BLE.No drift.  Gait:  deferred  Most Recent NIH 0    ASSESSMENT/PLAN  Mr. HERMAN FIERO is a 83 y.o. male with history of a flutter not on anticoagulation, hypertension, hyperlipidemia, CAD s/p CABG who presents to [facility] ED via EMS for evaluation of difficulty speaking, admitted for stroke workup.  NIH on Admission: 4.  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  Remote history of a flutter status post ablation in 2015   Code Stroke CT head No acute abnormality. Small vessel disease. Atrophy. ASPECTS 10.    CTA head & neck  60% stenosis right ICA High-grade stenosis R vertebral Severe stenosis origin of Left Vertebral Dense calcifications of left carotid bifurcation MRI    Restricted diffusion along the posterior aspect of the left Sylvian fissure extending to the atrium of the left lateral ventricle consistent with acute infarct. Additional small infarcts involving the posterior left temporal lobe, left parietal lobe and anterior inferior left frontal lobe. No acute hemorrhage or mass lesion. 2D Echo: Ejection fraction 50%.  Left atrial size mildly dilated Carotid US 04/2023: Mild Disease Right Carotid Moderate Plaque Left Carotid LDL 46 HgbA1c 5.8 VTE prophylaxis - lovenox aspirin 81 mg daily prior to admission, Eliquis Therapy recommendations:  Outpatient SLP, pending OT and PT evaluation and recommendations Disposition: Home Hx of Atrial flutter S.p cardioversion 2013 S/p ablation 2015 Home Meds: carvedilol 12.25mg  BID, held in the setting of significant bradycardia and second-degree AV block overnight Continue telemetry monitoring Noted to have bradycardia with 2nd degree AVB overnight with rate to the 20s and 30s No evidence of Atrial Flutter on monitor this admission or since ablation >10 years ago Cardiology consult recommends  30-day monitor on discharge to evaluate for further secondary AV block off of AV node blockers potentially necessitating PPM placement  Hypertension Home meds:  Hygroton 12.5mg  daily, valsartan 320mg , Coreg--held Stable Blood pressure goal: gradual normotension. Hold Coreg in the setting of degree AV block with bradycardia overnight Avoid hypotension.   Hyperlipidemia Home meds:  Lipitor 40mg , resumed in hospital LDL 46, goal <  70 Continue statin at discharge  Diabetes type II, Pre-diabetic Home meds:  none HgbA1c 5.8, goal < 7.0 CBGs SSI Recommend close follow-up with PCP  Other Stroke Risk Factors Coronary artery disease S/p remote MI 1993  Other Active Problems   Hospital day # 2  Patient presented with embolic left MCA branch infarct and subsequently with was found to have atrial fibrillation on cardiac monitoring.  Agree with time to switch to Eliquis for long-term anticoagulation and discontinue antiplatelet therapy.  Discussed with Dr. Randol Kern.  Long discussion with patient and his wife and family at the bedside and answered questions.  .  Greater than 50% time during this 35-minute visit was spent on counseling and coordination of care and discussion patient and family and care team and answering questions.  Follow-up as an outpatient stroke clinic in 2 months.  Stroke team will sign off.  Can you call for questions  Delia Heady, MD Medical Director [facility] Pager: [phone] 03/30/2024 4:58 PM  To contact Stroke Continuity provider, please refer to [email or web address]. After hours, contact General Neurology

## 2024-03-30 NOTE — Telephone Encounter (Signed)
 Pharmacy Patient Advocate Encounter  Insurance verification completed.    The patient is insured through [employer]. Patient has Medicare and is not eligible for a copay card, but may be able to apply for patient assistance or Medicare RX Payment Plan (Patient Must reach out to their plan, if eligible for payment plan), if available.    Ran test claim for ELIQUIS 5MG  TABLETS and the current 30 day co-pay is 253.43.   This test claim was processed through [facility]- copay amounts may vary at other pharmacies due to pharmacy/plan contracts, or as the patient moves through the different stages of their insurance plan.  Copay applied to the deductible. $47.00 after deductible.

## 2024-03-30 NOTE — Evaluation (Signed)
 Physical Therapy Evaluation Patient Details Name: Xavier Bell MRN: [id number] DOB: 1940-12-28 Today's Date: 03/30/2024  History of Present Illness  Pt is an 83 y/o male presenting 4/5 with R sided weakness and slurred speech. MRI brain showed infarct along posterior aspect of L Sylvian fissure extending to atrium of L lateral ventricle, as well as small infarcts in posterior L temporal, parietal and anterior L frontal lobe. PMH: atrial flutter, CAD s/p remote MI and CABG x 5 in 1996, HTN, retinal detachment.  Clinical Impression  Pt admitted with above diagnosis. Previously independent without assistive device. Required supervision with static and dynamic gait challenges demonstrating some impaired balance without an assistive device. Pt reports due to not having shoes on. Educated on findings; DGI and BERG scores indicate increased fall risk and recommend SPC use for gait. Suggested OPPT follow-up to reduce fall risk and maximize safety/independence. Wife reports pt has cane and will encourage him to use. Adequate for d/c from mobility standpoint with wife's supervision. Pt currently with functional limitations due to the deficits listed below (see PT Problem List). Pt will benefit from acute skilled PT to increase their independence and safety with mobility to allow discharge.           If plan is discharge home, recommend the following: A little help with walking and/or transfers;Assist for transportation;Help with stairs or ramp for entrance;Assistance with feeding   Can travel by private vehicle        Equipment Recommendations None recommended by PT  Recommendations for Other Services       Functional Status Assessment Patient has had a recent decline in their functional status and demonstrates the ability to make significant improvements in function in a reasonable and predictable amount of time.     Precautions / Restrictions Precautions Precautions: Fall Recall of  Precautions/Restrictions: Intact Restrictions Weight Bearing Restrictions Per Provider Order: No      Mobility  Bed Mobility Overal bed mobility: Independent             General bed mobility comments: no assist    Transfers Overall transfer level: Modified independent Equipment used: None               General transfer comment: Able to rise without UEs, braces legs against bed for support.    Ambulation/Gait Ambulation/Gait assistance: Supervision Gait Distance (Feet): 200 Feet Assistive device: None Gait Pattern/deviations: Step-through pattern, Drifts right/left, Decreased stride length Gait velocity: dec Gait velocity interpretation: 1.31 - 2.62 ft/sec, indicative of limited community ambulator   General Gait Details: Close to mod I level however with dynamic challenges demonstrates instability. Able to self correct. Educated on findings, AD recommended [facility]), and safety awareness.  Stairs            Wheelchair Mobility     Tilt Bed    Modified Rankin (Stroke Patients Only) Modified Rankin (Stroke Patients Only) Pre-Morbid Rankin Score: No symptoms Modified Rankin: Moderate disability     Balance Overall balance assessment: Needs assistance Sitting-balance support: No upper extremity supported, Feet supported Sitting balance-Leahy Scale: Normal     Standing balance support: No upper extremity supported Standing balance-Leahy Scale: Good                   Standardized Balance Assessment Standardized Balance Assessment : Dynamic Gait Index, Berg Balance Test Berg Balance Test Sit to Stand: Able to stand without using hands and stabilize independently Standing Unsupported: Able to stand safely 2 minutes Sitting with Back  Unsupported but Feet Supported on Floor or Stool: Able to sit safely and securely 2 minutes Stand to Sit: Sits safely with minimal use of hands Transfers: Able to transfer safely, minor use of hands Standing  Unsupported with Eyes Closed: Able to stand 10 seconds safely Standing Ubsupported with Feet Together: Able to place feet together independently and stand for 1 minute with supervision From Standing, Reach Forward with Outstretched Arm: Can reach confidently >25 cm (10") From Standing Position, Pick up Object from Floor: Able to pick up shoe, needs supervision From Standing Position, Turn to Look Behind Over each Shoulder: Looks behind from both sides and weight shifts well Turn 360 Degrees: Able to turn 360 degrees safely in 4 seconds or less Standing Unsupported, Alternately Place Feet on Step/Stool: Able to complete 4 steps without aid or supervision Standing Unsupported, One Foot in Front: Able to take small step independently and hold 30 seconds Standing on One Leg: Tries to lift leg/unable to hold 3 seconds but remains standing independently Total Score: 47 Dynamic Gait Index Level Surface: Normal Change in Gait Speed: Mild Impairment Gait with Horizontal Head Turns: Normal Gait with Vertical Head Turns: Normal Gait and Pivot Turn: Moderate Impairment Step Over Obstacle: Normal Step Around Obstacles: Mild Impairment Steps: Moderate Impairment Total Score: 18       Pertinent Vitals/Pain Pain Assessment Pain Assessment: No/denies pain    Home Living Family/patient expects to be discharged to:: Private residence Living Arrangements: Spouse/significant other Available Help at Discharge: Family Type of Home: House Home Access: Stairs to enter Entrance Stairs-Rails: None Entrance Stairs-Number of Steps: 4-5 Alternate Level Stairs-Number of Steps: steps up to attic and bonus room over garage (goes up here maybe 1x/wk per spouse) Home Layout: Two level;Able to live on main level with bedroom/bathroom Home Equipment: Gilmer Mor - single point      Prior Function Prior Level of Function : Independent/Modified Independent;Driving             Mobility Comments: no AD ADLs  Comments: Indep with ADLs, IADLs, driving. enjoys stock market     Extremity/Trunk Assessment   Upper Extremity Assessment Upper Extremity Assessment: Defer to OT evaluation    Lower Extremity Assessment Lower Extremity Assessment: Overall WFL for tasks assessed    Cervical / Trunk Assessment Cervical / Trunk Assessment: Normal  Communication   Communication Communication: No apparent difficulties    Cognition Arousal: Alert Behavior During Therapy: WFL for tasks assessed/performed, Restless   PT - Cognitive impairments: Attention, Safety/Judgement                         Following commands: Intact       Cueing Cueing Techniques: Verbal cues, Gestural cues     General Comments      Exercises     Assessment/Plan    PT Assessment Patient needs continued PT services  PT Problem List Decreased balance;Decreased mobility;Decreased coordination;Decreased knowledge of use of DME;Decreased cognition;Decreased safety awareness       PT Treatment Interventions DME instruction;Gait training;Functional mobility training;Stair training;Therapeutic activities;Therapeutic exercise;Balance training;Neuromuscular re-education;Cognitive remediation;Patient/family education    PT Goals (Current goals can be found in the Care Plan section)  Acute Rehab PT Goals Patient Stated Goal: Go home PT Goal Formulation: With patient/family Time For Goal Achievement: 04/13/24 Potential to Achieve Goals: Good    Frequency Min 2X/week     Co-evaluation               AM-PAC PT "  6 Clicks" Mobility  Outcome Measure Help needed turning from your back to your side while in a flat bed without using bedrails?: None Help needed moving from lying on your back to sitting on the side of a flat bed without using bedrails?: None Help needed moving to and from a bed to a chair (including a wheelchair)?: A Little Help needed standing up from a chair using your arms (e.g., wheelchair  or bedside chair)?: A Little Help needed to walk in hospital room?: A Little Help needed climbing 3-5 steps with a railing? : A Little 6 Click Score: 20    End of Session Equipment Utilized During Treatment: Gait belt Activity Tolerance: Patient tolerated treatment well Patient left: in bed;with call bell/phone within reach;with bed alarm set;with family/visitor present   PT Visit Diagnosis: Unsteadiness on feet (R26.81);Other abnormalities of gait and mobility (R26.89);Other symptoms and signs involving the nervous system (R29.898)    Time: 1140-1157 PT Time Calculation (min) (ACUTE ONLY): 17 min   Charges:   PT Evaluation $PT Eval Low Complexity: 1 Low   PT General Charges $$ ACUTE PT VISIT: 1 Visit         Kathlyn Sacramento, PT, DPT [facility]  Rehabilitation Services Physical Therapist Office: [phone] Website: [email or web address]   Berton Mount 03/30/2024, 1:21 PM

## 2024-03-30 NOTE — Plan of Care (Signed)

## 2024-03-30 NOTE — Progress Notes (Signed)
 PT Cancellation Note  Patient Details Name: Xavier Bell MRN: [id number] DOB: 1941/12/21   Cancelled Treatment:    Reason Eval/Treat Not Completed: Patient at procedure or test/unavailable  Currently undergoing ultrasound testing in room. Will check back shortly for formal PT evaluation.  Kathlyn Sacramento, PT, DPT [facility]  Rehabilitation Services Physical Therapist Office: [phone] Website: [email or web address]   Berton Mount 03/30/2024, 11:06 AM

## 2024-03-30 NOTE — Plan of Care (Signed)
   Problem: Education: Goal: Knowledge of disease or condition will improve Outcome: Completed/Met Goal: Knowledge of secondary prevention will improve (MUST DOCUMENT ALL) Outcome: Completed/Met Goal: Knowledge of patient specific risk factors will improve (DELETE if not current risk factor) Outcome: Completed/Met   Problem: Ischemic Stroke/TIA Tissue Perfusion: Goal: Complications of ischemic stroke/TIA will be minimized Outcome: Completed/Met   Problem: Coping: Goal: Will verbalize positive feelings about self Outcome: Completed/Met Goal: Will identify appropriate support needs Outcome: Completed/Met   Problem: Health Behavior/Discharge Planning: Goal: Ability to manage health-related needs will improve Outcome: Completed/Met Goal: Goals will be collaboratively established with patient/family Outcome: Completed/Met   Problem: Self-Care: Goal: Ability to participate in self-care as condition permits will improve Outcome: Completed/Met Goal: Verbalization of feelings and concerns over difficulty with self-care will improve Outcome: Completed/Met Goal: Ability to communicate needs accurately will improve Outcome: Completed/Met   Problem: Nutrition: Goal: Risk of aspiration will decrease Outcome: Completed/Met Goal: Dietary intake will improve Outcome: Completed/Met   Problem: Education: Goal: Knowledge of General Education information will improve Description: Including pain rating scale, medication(s)/side effects and non-pharmacologic comfort measures Outcome: Completed/Met   Problem: Health Behavior/Discharge Planning: Goal: Ability to manage health-related needs will improve Outcome: Completed/Met   Problem: Clinical Measurements: Goal: Ability to maintain clinical measurements within normal limits will improve Outcome: Completed/Met Goal: Will remain free from infection Outcome: Completed/Met Goal: Diagnostic test results will improve Outcome:  Completed/Met Goal: Respiratory complications will improve Outcome: Completed/Met Goal: Cardiovascular complication will be avoided Outcome: Completed/Met   Problem: Activity: Goal: Risk for activity intolerance will decrease Outcome: Completed/Met   Problem: Nutrition: Goal: Adequate nutrition will be maintained Outcome: Completed/Met   Problem: Coping: Goal: Level of anxiety will decrease Outcome: Completed/Met   Problem: Elimination: Goal: Will not experience complications related to bowel motility Outcome: Completed/Met Goal: Will not experience complications related to urinary retention Outcome: Completed/Met   Problem: Pain Managment: Goal: General experience of comfort will improve and/or be controlled Outcome: Completed/Met   Problem: Safety: Goal: Ability to remain free from injury will improve Outcome: Completed/Met   Problem: Skin Integrity: Goal: Risk for impaired skin integrity will decrease Outcome: Completed/Met

## 2024-03-30 NOTE — Progress Notes (Signed)
  Echocardiogram 2D Echocardiogram has been performed.  Rosemary Holms, RDCS 03/30/2024, 12:07 PM

## 2024-03-30 NOTE — Progress Notes (Signed)
 Patient Name: Xavier Bell Date of Encounter: 03/30/2024 [facility] Cardiologist: Kristeen Miss, MD   Interval Summary  .    Feeling well.  Ambulated room without lightheadedness or dizziness.  Denies palpitations.   Vital Signs .    Vitals:   03/30/24 0100 03/30/24 0300 03/30/24 0400 03/30/24 0500  BP:   136/84   Pulse: 81 (!) 34 (!) 55 60  Resp: 15 16 (!) 23 13  Temp:   98.6 F (37 C)   TempSrc:   Oral   SpO2: 95% 94% 95% 97%  Weight:      Height:       No intake or output data in the 24 hours ending 03/30/24 0932    03/29/2024    3:52 PM 12/27/2023    8:45 AM 11/05/2023    2:55 PM  Last 3 Weights  Weight (lbs) 156 lb 1.6 oz 150 lb 157 lb 12.8 oz  Weight (kg) 70.806 kg 68.04 kg 71.578 kg      Telemetry/ECG    Atrial fibrillation.  Rate 30s overnight.  50-60s during day.  PVCs.  - Personally Reviewed  03/30/2024: Atrial fibrillation.  Rate 59.  PVCs.  Physical Exam .    VS:  BP (!) 145/67   Pulse (!) 56   Temp 98.6 F (37 C) (Oral)   Resp 15   Ht 5\' 7"  (1.702 m)   Wt 70.8 kg   SpO2 96%   BMI 24.45 kg/m  , BMI Body mass index is 24.45 kg/m. GENERAL:  Well appearing HEENT: Pupils equal round and reactive, fundi not visualized, oral mucosa unremarkable NECK:  No jugular venous distention, waveform within normal limits, carotid upstroke brisk and symmetric, no bruits, no thyromegaly LUNGS:  Clear to auscultation bilaterally HEART:  Irregularly irregular.  PMI not displaced or sustained,S1 and S2 within normal limits, no S3, no S4, no clicks, no rubs, no murmurs ABD:  Flat, positive bowel sounds normal in frequency in pitch, no bruits, no rebound, no guarding, no midline pulsatile mass, no hepatomegaly, no splenomegaly EXT:  2 plus pulses throughout, no edema, no cyanosis no clubbing SKIN:  No rashes no nodules NEURO:  Cranial nerves II through XII grossly intact, motor grossly intact throughout PSYCH:  Cognitively intact, oriented to person  place and time   Assessment & Plan .     81M with CAD s/p CABG, atrial flutter s/p ablqation in 2015 admitted with acute stroke.  # CVA:  Presented with aphasia which is subsequently resolved.  Imaging consistent with acute infarct with restricted diffusion along the posterior aspect of the left sylvian fissure extending to the antrum of the left lateral ventricle.  Also additional small infarcts of the posterior left temporal lobe, left parietal lobe, and anterior inferior left frontal lobe.  He was also noted to have dense calcification of the left carotid bifurcation but no significant stenosis.  He was started on aspirin and clopidogrel.  However, telemetry has since revealed atrial fibrillation.  This is most consistent with likely embolic source of his stroke as revealed by MRI of the brain.  Recommend switching from aspirin and clopidogrel to Eliquis.  Echo is pending.  # Carotid stenosis: # Vertebral stenosis: 60% stenosis of the proximal right ICA and high-grade stenosis of the proximal right vertebral artery.  Severe stenosis of the origin of the left vertebral artery.  # Mobitz II second degree AV block:  # Bradycardia: Overnight bradycardia with second-degree AV block noted.  Carvedilol has been held.  He was on 12.5 mg twice daily on admission.  Check TSH.  Wife reports that he snores.  Will get outpatient sleep study.   # Atrial flutter: History of remote atrial flutter ablation in 2015.  Anticoagulation had been discontinued.  Resuming as above.  #  CAD s/p CABG:  # HYperlipidemia:  - Holding beta blocker as above.  Continue statin.   Dispo: OK to discharge.  Will arrange f/u I Afib clinic.   For questions or updates, please contact [facility] Please consult [email or web address] for contact info under        Signed, Chilton Si, MD

## 2024-03-30 NOTE — Progress Notes (Signed)
 Reviewed AVS, patient expressed understanding of medications, MD follow up reviewed.   Patient states all belongings brought to the hospital at time of admission are accounted for and packed to take home.  Picked up medications from [facility] pharmacy. Pt transported to entrance A where family member was waiting in vehicle to transport home.

## 2024-03-30 NOTE — Evaluation (Signed)
 Occupational Therapy Evaluation Patient Details Name: Xavier Bell MRN: [id number] DOB: Apr 23, 1941 Today's Date: 03/30/2024   History of Present Illness   Pt is an 83 y/o male presenting with R sided weakness and slurred speech. MRI brain showed infarct along posterior aspect of L Sylvian fissure extending to atrium of L lateral ventricle, as well as small infarcts in posterior L temporal, parietal and anterior L frontal lobe. PMH: atrial flutter, CAD s/p remote MI and CABG x 5 in 1996, HTN, retinal detachment.     Clinical Impressions PTA, pt lives with spouse and typically completely Independent in all ADLs, IADLs, driving and mobility. Pt presents now with reported minor deficits in speech and handwriting abilities though B UE coordination, strength and sensation overall WFL and symmetrical. Pt able to manage ADLs and in room mobility Independently. Encouraged continued handwriting practice and recommended consideration of OP OT if deficits persist. Pt and spouse deny concerns; functionally appropriate for DC home once medically cleared.      If plan is discharge home, recommend the following:   Other (comment) (PRN)     Functional Status Assessment   Patient has had a recent decline in their functional status and demonstrates the ability to make significant improvements in function in a reasonable and predictable amount of time.     Equipment Recommendations   None recommended by OT     Recommendations for Other Services         Precautions/Restrictions   Precautions Precautions: None Restrictions Weight Bearing Restrictions Per Provider Order: No     Mobility Bed Mobility               General bed mobility comments: standing in room on entry    Transfers Overall transfer level: Independent Equipment used: None                      Balance Overall balance assessment: No apparent balance deficits (not formally assessed)                                          ADL either performed or assessed with clinical judgement   ADL Overall ADL's : Independent                                       General ADL Comments: standing in room on entry, able to mobilize to/from bathroom, manage toileting task. reports doffing soiled clothing overnight and going to bathroom multiple times. discussed handwriting, encouraged practice with lined notebooks. Coordination, strength and sensation appear WFL so anticipate writing to improve     Vision Baseline Vision/History: 1 Wears glasses Ability to See in Adequate Light: 0 Adequate Patient Visual Report: No change from baseline Vision Assessment?: No apparent visual deficits     Perception         Praxis         Pertinent Vitals/Pain Pain Assessment Pain Assessment: No/denies pain     Extremity/Trunk Assessment Upper Extremity Assessment Upper Extremity Assessment: Overall WFL for tasks assessed;Right hand dominant   Lower Extremity Assessment Lower Extremity Assessment: Defer to PT evaluation   Cervical / Trunk Assessment Cervical / Trunk Assessment: Normal   Communication Communication Communication: No apparent difficulties   Cognition Arousal: Alert Behavior During Therapy: WFL for tasks assessed/performed,  Restless Cognition: No apparent impairments             OT - Cognition Comments: not formally assessed. pleasant, talkative, attention deficits, repeated story of NT assistance overnight with leads                 Following commands: Intact       Cueing  General Comments   Cueing Techniques: Verbal cues;Gestural cues  Wife present at bedside, supportive.   Exercises     Shoulder Instructions      Home Living Family/patient expects to be discharged to:: Private residence Living Arrangements: Spouse/significant other Available Help at Discharge: Family Type of Home: House Home Access: Stairs to  enter [employer] of Steps: 4-5 Entrance Stairs-Rails: None Home Layout: Two level;Able to live on main level with bedroom/bathroom Alternate Level Stairs-Number of Steps: steps up to attic and bonus room over garage (goes up here maybe 1x/wk per spouse)                        Prior Functioning/Environment Prior Level of Function : Independent/Modified Independent;Driving             Mobility Comments: no AD ADLs Comments: Indep with ADLs, IADLs, driving. enjoys stock market    OT Problem List: Decreased coordination;Decreased strength   OT Treatment/Interventions: Self-care/ADL training;Therapeutic exercise;Energy conservation;DME and/or AE instruction;Therapeutic activities;Patient/family education;Balance training      OT Goals(Current goals can be found in the care plan section)   Acute Rehab OT Goals Patient Stated Goal: home today OT Goal Formulation: With patient/family Time For Goal Achievement: 04/13/24 Potential to Achieve Goals: Good ADL Goals Pt/caregiver will Perform Home Exercise Program: Increased strength;Both right and left upper extremity;With written HEP provided Additional ADL Goal #1: Pt to demonstrate strategies to improve handwriting   OT Frequency:  Min 1X/week    Co-evaluation              AM-PAC OT "6 Clicks" Daily Activity     Outcome Measure Help from another person eating meals?: None Help from another person taking care of personal grooming?: None Help from another person toileting, which includes using toliet, bedpan, or urinal?: None Help from another person bathing (including washing, rinsing, drying)?: None Help from another person to put on and taking off regular upper body clothing?: None Help from another person to put on and taking off regular lower body clothing?: None 6 Click Score: 24   End of Session Nurse Communication: Mobility status  Activity Tolerance: Patient tolerated treatment well Patient  left: in bed;with call bell/phone within reach;with family/visitor present  OT Visit Diagnosis: Other abnormalities of gait and mobility (R26.89)                Time: 1610-9604 OT Time Calculation (min): 19 min Charges:  OT General Charges $OT Visit: 1 Visit OT Evaluation $OT Eval Low Complexity: 1 Low  Bradd Canary, OTR/L Acute Rehab Services Office: [phone]   Lorre Munroe 03/30/2024, 8:07 AM

## 2024-03-30 NOTE — Telephone Encounter (Signed)
 Pharmacy Patient Advocate Encounter  Insurance verification completed.    The patient is insured through [facility]. Patient has Medicare and is not eligible for a copay card, but may be able to apply for patient assistance or Medicare RX Payment Plan (Patient Must reach out to their plan, if eligible for payment plan), if available.    Ran test claim for XARELTO 15MG  TABLETS and the current 30 day co-pay is 253.43.  Ran test claim for XARELTO 20MG  TABLETS and the current 30 day co-pay is 253.43.   This test claim was processed through [facility]- copay amounts may vary at other pharmacies due to pharmacy/plan contracts, or as the patient moves through the different stages of their insurance plan.  Copay applied to deductible. $47.00 after deductible.

## 2024-03-30 NOTE — Progress Notes (Signed)
 PHARMACY - ANTICOAGULATION CONSULT NOTE  Pharmacy Consult for Apixaban Indication: atrial fibrillation  Allergies  Allergen Reactions   Niacin And Related Other (See Comments)    Stomach Pain and flushing     Patient Measurements: Height: 5\' 7"  (170.2 cm) Weight: 70.8 kg (156 lb 1.6 oz) IBW/kg (Calculated) : 66.1 HEPARIN DW (KG): 70.8  Vital Signs: Temp: 98.6 F (37 C) (04/07 0400) Temp Source: Oral (04/07 0400) BP: 145/67 (04/07 0950) Pulse Rate: 56 (04/07 0950)  Labs: Recent Labs    03/28/24 0944 03/28/24 1007 03/29/24 0513  HGB 12.3* 11.9* 11.1*  HCT 37.3* 35.0* 32.6*  PLT 295  --  267  APTT 29  --   --   LABPROT 15.1  --   --   INR 1.2  --   --   CREATININE 1.37* 1.40* 1.27*  TROPONINIHS 32*  --   --     Estimated Creatinine Clearance: 39.8 mL/min (A) (by C-G formula based on SCr of 1.27 mg/dL (H)).   Medical History: Past Medical History:  Diagnosis Date   Atrial flutter (HCC)    s/p cardioversion 02/08/12; ablation by Dr Ladona Ridgel [phone]   Chronic anticoagulation    Coronary artery disease    s/p remote MI in 1994 with multiple PCI and subsequent CABG x 5 in 1996   Dyslipidemia    Hypertension    MI (myocardial infarction) (HCC) 1995   Normal nuclear stress test 2006   Pseudoaphakia    both eyes   Retinal detachment     Medications:  Medications Prior to Admission  Medication Sig Dispense Refill Last Dose/Taking   amLODipine (NORVASC) 10 MG tablet Take 1 tablet (10 mg total) by mouth daily. 90 tablet 3 03/28/2024   aspirin 81 MG tablet Take 81 mg by mouth daily.   03/28/2024   atorvastatin (LIPITOR) 40 MG tablet TAKE 1 TABLET EVERY DAY 90 tablet 3 03/28/2024   brimonidine (ALPHAGAN) 0.2 % ophthalmic solution Place 1 drop into both eyes in the morning and at bedtime.   03/28/2024   carvedilol (COREG) 12.5 MG tablet TAKE 1 TABLET TWICE DAILY 180 tablet 3 03/28/2024   chlorthalidone (HYGROTON) 25 MG tablet TAKE 1/2 TABLET EVERY DAY 45 tablet 3 03/28/2024    Cholecalciferol (VITAMIN D) 2000 units tablet Take 2,000 Units by mouth daily.   03/28/2024   dorzolamide-timolol (COSOPT) 22.3-6.8 MG/ML ophthalmic solution Place 2 drops into both eyes 2 (two) times daily.   03/28/2024   fenofibrate (TRICOR) 145 MG tablet TAKE 1 TABLET EVERY DAY 90 tablet 3 03/28/2024   latanoprost (XALATAN) 0.005 % ophthalmic solution Place 1 drop into both eyes at bedtime.    03/28/2024   Multiple Vitamin (MULTIVITAMIN) tablet Take 1 tablet by mouth daily.   03/28/2024   Omega-3 Fatty Acids (OMEGA 3 PO) Take 1 capsule by mouth daily.   03/28/2024   potassium chloride SA (KLOR-CON M) 20 MEQ tablet Take 1 tablet (20 mEq total) by mouth daily. 90 tablet 3 03/28/2024   valsartan (DIOVAN) 320 MG tablet TAKE 1 TABLET EVERY DAY 90 tablet 3 03/28/2024   Zinc 50 MG CAPS Take 50 mg by mouth daily.   03/28/2024   Scheduled:   apixaban  5 mg Oral BID   atorvastatin  40 mg Oral Daily   brimonidine  1 drop Both Eyes BID   dorzolamide-timolol  2 drop Both Eyes BID   fenofibrate  160 mg Oral Daily   latanoprost  1 drop Both Eyes QHS  Infusions:   Assessment: 83 y.o male with history of atrias flutter s/p ablation 2015 not on anticoagulation prior to admission.  Admitted 03/28/24 with acute CVA.  Cardiologist recommended to discontinue ASA and  consulted pharmacy to start Apixaban for nonvalvular atrial fibrillation.   Weight is > 60 kg,  Scr is < 1.5, thus appropriate dose is 5mg  BID.  Asprin was discontinued   Goal of Therapy:  Monitor platelets by anticoagulation protocol: Yes   Plan:  Apixaban 5mg  PO BID Monitor renal function and for s/sx of bleeding  Noah Delaine, RPh Clinical Pharmacist 03/30/2024,10:26 AM

## 2024-03-31 ENCOUNTER — Other Ambulatory Visit: Payer: Self-pay | Admitting: Cardiovascular Disease

## 2024-03-31 ENCOUNTER — Telehealth: Payer: Self-pay | Admitting: Cardiovascular Disease

## 2024-03-31 NOTE — Telephone Encounter (Signed)
 Pt would like a c/b from the nurse in regards to his hospital visit. Please advise

## 2024-03-31 NOTE — Telephone Encounter (Signed)
 Per pt he had a recent CVA and has had some med changes and asked to see Dr Elease Hashimoto to go over everything for reassurance:   Acute CVA  - Acute ischemic Infarcts Posterior Left Sylvian fissure area, posterior left temporal lobe, left parietal lobe, anterior inferior left frontal lobe - etiology of stroke related to cardioembolic in the setting of paroxysmal A-fib, not on anticoagulation as status post ablation, overnight telemetry monitor showing evidence of A-fib   I made him an appt for this Friday 04/03/24 with Dr Elease Hashimoto.

## 2024-04-02 ENCOUNTER — Encounter: Payer: Self-pay | Admitting: Cardiovascular Disease

## 2024-04-02 NOTE — Progress Notes (Unsigned)
 Cardiology Office Note   Date:  04/03/2024   ID:  Jakyle, Petrucelli [birth year], MRN [id number]  PCP:  Nobie Putnam, MD  Cardiologist:   Kristeen Miss, MD   Chief Complaint  Patient presents with   Coronary Artery Disease        Hypertension        1. Coronary artery disease-status post CABG 2. Atrial flutter- s/p cardioversion 02/08/12 3. Hypertension 4. Right carotid bruit     Has a history of atrial flutter this past spring. He status post cardioversion. [name] done very well. He continues to take Xarelto.  [name] not been exercising nearly as much as he needs to. [name] also been careless with his diet and has picked up a few pounds.  March 06, 2013:  He is doing well.  No angina.  He is continuing to have foot problems - especially of the left foot.  He has intense pain in his heel when he walks.  Sept. 12, 2014:  He has done well.  Still not exercising as much.  Takes motrin at night.  He has left plantar faciatis.    March 19, 2014:  Xavier Bell is doing ok No CP or dyspnea.  No palpitations. He has gone back into atrial flutter.   He cannot tell that his HR is irreg.   He has a hx of a-flutter in the past ( 2014) and was treated with Xarelto and was cardioverted.   May 09, 2015 Shayde Gervacio Cranston is a 83 y.o. male who presents for follow up of his atrial flutter , HTN, CAD No CP , not exercising much .   Nov. 14, 2016: Doing well. BP is a bit high. Not much exercise ,  Sore feet.   May 28, 2016:  Doing well.  BP is normal  No CP , no dyspnea at rest.   Slight DOE with activity. Not exercising   Jan. 8, 2018:  Xavier Bell is seen today .   Doing well.   Not exercising much .   Knows that he needs to.  Aug. 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  No CP , no dyspnea  At breakfast take your medicine check your blood pressure an hour after you take your medicine about the ones first in the morning are always good to be follow-up okay will help a little bit  so blood pressure is up to 1 2130s using a wrist April 21, 2018:  Xavier Bell is seen back today for follow-up of his coronary artery disease and mild to moderate carotid artery disease.  Recent carotid duplex scan reveals moderate left internal carotid plaque and mild right internal carotid disease.  Hx of hyperlipidemia ,    Had some back issues  - has 3 bulging discs, 1 was herniated  Back has improved some with conservative management .   Does not think he will need surgery any time soon.   No CP or dyspnea   November 03, 2018: She is seen today for follow-up of his coronary artery disease and moderate carotid artery disease.  Is a moderate left internal carotid plaque.  He has mild right internal carotid disease.  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 19, 2020 :  Xavier Bell is seen today for follow up visit Has a hx of atrial flutter - is s/p ablation  .   -  is in NSR  Today  BP is a bit elevated.  Has gained a few lbs.   February 15, 2020:  Xavier Bell is seen today for follow-up visit.  He has a history of coronary artery disease and is status post coronary artery bypass grafting.  He has moderate carotid artery disease.  Said a history of atrial flutter and is status post ablation. Fell through his ceiling from the attic.   Broke his left ankle ,  Had surgical repair of his ankle  Otherwise doing ok No covid vaccine yet.     Aug. 23, 2021:  Xavier Bell is seen today for follow up of his CAD .  BP has been elevated.  He bought a BP cuff. And gets variable readings.  He does not trust the cuff.  I reviewed his recordings   Admits that his diet is not what is should be.  Still eating chips,  Cheese  No angina . No dyspnea   Carotid study is unchanged.   Labs from 3 days ago are stable   Feb. 16, 2022: Xavier Bell is seen today for follow up of his CAD  HTN HLD Is exercising some .   Trying to  lose some weight . 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   BP log shows BP typically in the 140-150 range.   Nov. 7, 2022 [facility] is seen today for follow up of his CAD , HTN, HLD  Has had some HTN - especially at a time when he was eating lots of salt .  ( Wife was afway so he was eating lots of chips / salsa Diet has improved.  Records his sodium intake on legal pad.  Typically eats between 1000 - 1500 mg of sodium .  BP now is great.  Gave the ok to have oyster stew at Christmas.     Nov. 6, 2023  Xavier Bell is seen today for follow up of his CAD, HTN, HLD  No CP, Has chronic dyspnea .   Exercises regularly ,  Walks 200 - 250 minutes a week  Has had Aflutter ablation , is only on ASA 81 mg now  Nov. 12, 2024 Xavier Bell is seen for follow up of his CAD , HTN, HLD  No CP,  no dyspnea   BP is good 1-2 hours after he takes his morming meds.    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, 60% prox Rt ICA stenosis He was DC'd from the hospital on April 7, 2-25  Is recovering fairly well  Is more stable   Is scheduled to see the afib clinic in several weeks   He will see an APP in several months        Past Medical History:  Diagnosis Date   Atrial flutter (HCC)    s/p cardioversion 02/08/12; ablation by Dr Ladona Ridgel [phone]   Chronic anticoagulation    Coronary artery disease    s/p remote MI in 1994 with multiple PCI and subsequent CABG x 5 in 1996   Dyslipidemia    Hypertension    MI (myocardial  infarction) [facility]) 1995   Normal nuclear stress test 2006   Pseudoaphakia    both eyes   Retinal detachment     Past Surgical History:  Procedure  Laterality Date   ATRIAL FLUTTER ABLATION N/A 12/08/2014   CTI by Dr Ladona Ridgel   CARDIAC CATHETERIZATION  1996   CARDIOVERSION  02/08/2012   Procedure: CARDIOVERSION;  Surgeon: Elyn Aquas., MD;  Location: [facility] OR;  Service: Cardiovascular;  Laterality: N/A;   CORONARY ARTERY BYPASS GRAFT  1996   LIMA to LAD, SVG to RV marginal & PD, SVG  to DX and SVG to Ramus   ORIF ANKLE FRACTURE Left 12/10/2019   Procedure: Open Reduction Internal fixation (ORIF) Left Ankle Fracture;  Surgeon: Toni Arthurs, MD;  Location: [facility];  Service: Orthopedics;  Laterality: Left;   TONSILLECTOMY AND ADENOIDECTOMY       Current Outpatient Medications  Medication Sig Dispense Refill   amLODipine (NORVASC) 10 MG tablet Take 1 tablet (10 mg total) by mouth daily. 90 tablet 3   apixaban (ELIQUIS) 5 MG TABS tablet Take 1 tablet (5 mg total) by mouth 2 (two) times daily. 60 tablet 0   atorvastatin (LIPITOR) 40 MG tablet TAKE 1 TABLET EVERY DAY 90 tablet 3   brimonidine (ALPHAGAN) 0.2 % ophthalmic solution Place 1 drop into both eyes in the morning and at bedtime.     Cholecalciferol (VITAMIN D) 2000 units tablet Take 2,000 Units by mouth daily.     dorzolamide-timolol (COSOPT) 22.3-6.8 MG/ML ophthalmic solution Place 2 drops into both eyes 2 (two) times daily.     fenofibrate (TRICOR) 145 MG tablet TAKE 1 TABLET EVERY DAY 90 tablet 3   latanoprost (XALATAN) 0.005 % ophthalmic solution Place 1 drop into both eyes at bedtime.      Multiple Vitamin (MULTIVITAMIN) tablet Take 1 tablet by mouth daily.     Omega-3 Fatty Acids (OMEGA 3 PO) Take 1 capsule by mouth daily.     potassium chloride SA (KLOR-CON M) 20 MEQ tablet TAKE 1 TABLET EVERY DAY 90 tablet 3   valsartan (DIOVAN) 320 MG tablet TAKE 1 TABLET EVERY DAY 90 tablet 3   Zinc 50 MG CAPS Take 50 mg by mouth daily.     No current facility-administered medications for this visit.    Allergies:   Niacin and related    Social History:  The  patient  reports that he has never smoked. He has never used smokeless tobacco. He reports that he does not drink alcohol and does not use drugs.   Family History:  The patient's family history includes Coronary artery disease in an other family member; Heart disease in his father and mother.    ROS:  Noted in current hx , otherwise , ROS is negative     Physical Exam: Blood pressure 130/72, pulse 82, height 5\' 7"  (1.702 m), weight 155 lb 9.6 oz (70.6 kg), SpO2 96%.       GEN:  Well nourished, well developed in no acute distress HEENT: Normal NECK: No JVD; No carotid bruits LYMPHATICS: No lymphadenopathy CARDIAC: irreg. Irreg no murmurs, rubs, gallops RESPIRATORY:  Clear to auscultation without rales, wheezing or rhonchi  ABDOMEN: Soft, non-tender, non-distended MUSCULOSKELETAL:  No edema; No deformity  SKIN: Warm and dry NEUROLOGIC:  Alert and oriented x 3   EKG:              Recent Labs: 03/28/2024: ALT 26 03/29/2024: BUN 28; Creatinine, Ser 1.27; Hemoglobin 11.1; Platelets  267; Potassium 3.6; Sodium 139 03/30/2024: TSH 3.537    Lipid Panel    Component Value Date/Time   CHOL 92 03/29/2024 0513   CHOL 130 11/05/2023 1538   TRIG 107 03/29/2024 0513   HDL 25 (L) 03/29/2024 0513   HDL 32 (L) 11/05/2023 1538   CHOLHDL 3.7 03/29/2024 0513   VLDL 21 03/29/2024 0513   LDLCALC 46 03/29/2024 0513   LDLCALC 68 11/05/2023 1538      Wt Readings from Last 3 Encounters:  04/03/24 155 lb 9.6 oz (70.6 kg)  03/29/24 156 lb 1.6 oz (70.8 kg)  12/27/23 150 lb (68 kg)      Other studies Reviewed: Additional studies/ records that were reviewed today include: . Review of the above records demonstrates:    ASSESSMENT AND PLAN:  1. Coronary artery disease-      he is not having any episodes of angina.  2. Atrial flutter  -   he recently presented with a stroke and was found to have recurrent atrial fibrillation.  He is now on Eliquis. He is doing well from a speech  standpoint.  He still slightly weak.  He may need to take some physical therapy.    3. Hypertensive heart disease without CHF -    blood pressure is well-controlled.    4. Right carotid bruit-        5. Hyperlipidemia:    -   Overall lipids look good.  His LDL from April 6 is 46.    HDL is 25      Current medicines are reviewed at length with the patient today.  The patient does not have concerns regarding medicines.     Labs/ tests ordered today include:   No orders of the defined types were placed in this encounter.     Disposition:          Kristeen Miss, MD  04/03/2024 2:19 PM    [facility] [address] [place], [place], [place]  [address] Phone: [phone]; Fax: [phone]

## 2024-04-03 ENCOUNTER — Ambulatory Visit: Attending: Cardiovascular Disease | Admitting: Cardiovascular Disease

## 2024-04-03 ENCOUNTER — Encounter: Payer: Self-pay | Admitting: Cardiovascular Disease

## 2024-04-03 VITALS — BP 130/72 | HR 82 | Ht 67.0 in | Wt 155.6 lb

## 2024-04-03 DIAGNOSIS — I480 Paroxysmal atrial fibrillation: Secondary | ICD-10-CM | POA: Diagnosis not present

## 2024-04-03 DIAGNOSIS — Z79899 Other long term (current) drug therapy: Secondary | ICD-10-CM

## 2024-04-03 DIAGNOSIS — Z8673 Personal history of transient ischemic attack (TIA), and cerebral infarction without residual deficits: Secondary | ICD-10-CM | POA: Diagnosis not present

## 2024-04-03 MED ORDER — APIXABAN 5 MG PO TABS: 5.0000 mg | ORAL_TABLET | Freq: Two times a day (BID) | ORAL | 3 refills | Status: DC

## 2024-04-03 NOTE — Patient Instructions (Signed)
 Medication Instructions:  REFILL Eliquis 5mg  twice daily *If you need a refill on your cardiac medications before your next appointment, please call your pharmacy*  Lab Work: CBC, BMET in 2 months If you have labs (blood work) drawn today and your tests are completely normal, you will receive your results only by: MyChart Message (if you have MyChart) OR A paper copy in the mail If you have any lab test that is abnormal or we need to change your treatment, we will call you to review the results.  Follow-Up: At [facility], you and your health needs are our priority.  As part of our continuing mission to provide you with exceptional heart care, our providers are all part of one team.  This team includes your primary Cardiologist (physician) and Advanced Practice Providers or APPs (Physician Assistants and Nurse Practitioners) who all work together to provide you with the care you need, when you need it.  Your next appointment:   2 month(s)  Provider:   APP     1st Floor: - Lobby - Registration  - Pharmacy  - Lab - Cafe  2nd Floor: - PV Lab - Diagnostic Testing (echo, CT, nuclear med)  3rd Floor: - Vacant  4th Floor: - TCTS (cardiothoracic surgery) - AFib Clinic - Structural Heart Clinic - Vascular Surgery  - Vascular Ultrasound  5th Floor: - HeartCare Cardiology (general and EP) - Clinical Pharmacy for coumadin, hypertension, lipid, weight-loss medications, and med management appointments    Valet parking services will be available as well.

## 2024-04-08 ENCOUNTER — Telehealth: Payer: Self-pay | Admitting: Cardiovascular Disease

## 2024-04-08 DIAGNOSIS — I10 Essential (primary) hypertension: Secondary | ICD-10-CM

## 2024-04-08 NOTE — Progress Notes (Signed)
 etiology of stroke is either cardioembolic in the setting of afibb not on AC or atheroembol from dense calcification of the L ICA at the origin with no significant stenosis

## 2024-04-08 NOTE — Telephone Encounter (Signed)
 Spoke with pt regarding his blood pressure readings. Pt stated that he had an appointment with Dr. Alroy Aspen on 4/11 and his blood pressure was 130/72 but since that appointment his blood pressures have been 160/80s. Pt stated he is not having any symptoms. Pt was told this information would be sent to Dr. Alroy Aspen and his nurse for suggestions. Pt verbalized understanding. All questions if any were answered.

## 2024-04-08 NOTE — Telephone Encounter (Signed)
 Pt c/o BP issue: STAT if pt c/o blurred vision, one-sided weakness or slurred speech.  STAT if BP is GREATER than 180/120 TODAY.  STAT if BP is LESS than 90/60 and SYMPTOMATIC TODAY  1. What is your BP concern? Patient noted the only normal reading was 138/73 on Monday, 7/14 and he is concerned about the high readings.    2. Have you taken any BP medication today?  Yes  3. What are your last 5 BP readings?  168/80  HR 56 - this morning Yesterday 166/84  HR 67 160/78  HR 73 169/75  HR 67 167/72  HR 59  4. Are you having any other symptoms (ex. Dizziness, headache, blurred vision, passed out)? No   Patient stated patient had BP medication change (chlorthalidone (HYGROTON) 25 MG table and carvedilol (COREG) 25 MG tablet were discontinued at hospital but his BP has been trending high.  Patient wants advice on next steps.

## 2024-04-09 NOTE — Telephone Encounter (Signed)
 Spoke to patient, patient is using wrist monitor and not following correct steps for home BP measurement. Advised to buy arm cuff and correct home BP measurement steps was  reviewed with patient. Patient to call back with some more readings on arm cuff in a week.   If BP remains elevated can consider addition of  thiazide diuretics to current valsartan 320 mg daily dose

## 2024-04-13 DIAGNOSIS — H401122 Primary open-angle glaucoma, left eye, moderate stage: Secondary | ICD-10-CM | POA: Diagnosis not present

## 2024-04-13 DIAGNOSIS — H401111 Primary open-angle glaucoma, right eye, mild stage: Secondary | ICD-10-CM | POA: Diagnosis not present

## 2024-04-15 ENCOUNTER — Ambulatory Visit (HOSPITAL_COMMUNITY)
Admit: 2024-04-15 | Discharge: 2024-04-15 | Disposition: A | Discharge status: Home / Self Care | Source: Ambulatory Visit | Attending: Internal Medicine | Admitting: Internal Medicine

## 2024-04-15 ENCOUNTER — Inpatient Hospital Stay (HOSPITAL_COMMUNITY)
Admission: RE | Admit: 2024-04-15 | Discharge: 2024-04-15 | Disposition: A | Discharge status: Home / Self Care | Source: Ambulatory Visit | Attending: Internal Medicine | Admitting: Internal Medicine

## 2024-04-15 ENCOUNTER — Encounter (HOSPITAL_COMMUNITY): Payer: Self-pay

## 2024-04-15 VITALS — BP 170/68 | HR 70 | Ht 67.0 in | Wt 156.8 lb

## 2024-04-15 DIAGNOSIS — I10 Essential (primary) hypertension: Secondary | ICD-10-CM | POA: Insufficient documentation

## 2024-04-15 DIAGNOSIS — I6529 Occlusion and stenosis of unspecified carotid artery: Secondary | ICD-10-CM | POA: Insufficient documentation

## 2024-04-15 DIAGNOSIS — H409 Unspecified glaucoma: Secondary | ICD-10-CM | POA: Insufficient documentation

## 2024-04-15 DIAGNOSIS — I2510 Atherosclerotic heart disease of native coronary artery without angina pectoris: Secondary | ICD-10-CM | POA: Insufficient documentation

## 2024-04-15 DIAGNOSIS — I4892 Unspecified atrial flutter: Secondary | ICD-10-CM | POA: Diagnosis present

## 2024-04-15 DIAGNOSIS — I441 Atrioventricular block, second degree: Secondary | ICD-10-CM | POA: Insufficient documentation

## 2024-04-15 DIAGNOSIS — Z7901 Long term (current) use of anticoagulants: Secondary | ICD-10-CM | POA: Diagnosis not present

## 2024-04-15 DIAGNOSIS — I480 Paroxysmal atrial fibrillation: Secondary | ICD-10-CM

## 2024-04-15 DIAGNOSIS — D6869 Other thrombophilia: Secondary | ICD-10-CM

## 2024-04-15 DIAGNOSIS — I4891 Unspecified atrial fibrillation: Secondary | ICD-10-CM | POA: Diagnosis present

## 2024-04-15 DIAGNOSIS — Z951 Presence of aortocoronary bypass graft: Secondary | ICD-10-CM | POA: Insufficient documentation

## 2024-04-15 DIAGNOSIS — Z8673 Personal history of transient ischemic attack (TIA), and cerebral infarction without residual deficits: Secondary | ICD-10-CM | POA: Insufficient documentation

## 2024-04-15 HISTORY — DX: Other thrombophilia: D68.69

## 2024-04-15 MED ORDER — CHLORTHALIDONE 25 MG PO TABS: 25.0000 mg | ORAL_TABLET | Freq: Every day | ORAL | 3 refills | Status: AC

## 2024-04-15 NOTE — Telephone Encounter (Signed)
 BP reading from last 2 days on new machine.  Monday 174/77 heart rate 70  Tuesday 182/86 heart rate 68 Advised to lower salt intake. Restarted chlorthalidone  25 mg daily will repeat BMP in 2 weeks.

## 2024-04-15 NOTE — Patient Instructions (Signed)
 Follow up 6 weeks - will have scheduling call you to setup

## 2024-04-15 NOTE — Progress Notes (Signed)
 Primary Care Physician: No primary care provider on file. Primary Cardiologist: Ardeen Kohler, MD Electrophysiologist: None     Referring Physician: Dr. Carlota Chestnut is a 83 y.o. male with a history of CVA, carotid stenosis, CAD s/p CABG, HTN, glaucoma, atrial flutter ablation (remote) in 2015, and atrial fibrillation who presents for consultation in the [facility]. Hospital admission 4/5-7/25 for acute CVA and found to be in new Afib. 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 Patient is on Eliquis  5 mg BID for a CHADS2VASC score of 6.  On evaluation today, he is currently in NSR. He began chlorthalidone  25 mg daily as of earlier today due to elevated BP since coreg  was discontinued. He snores per wife. He does not drink alcohol. He drinks 1-2 cups of coffee daily. He has a flip phone. He has not missed any doses of Eliquis  5 mg BID.   Today, he denies symptoms of palpitations, chest pain, shortness of breath, orthopnea, PND, lower extremity edema, dizziness, presyncope, syncope, snoring, daytime somnolence, bleeding, or neurologic sequela. The patient is tolerating medications without difficulties and is otherwise without complaint today.    Atrial Fibrillation Risk Factors:  he does have symptoms or diagnosis of sleep apnea.  he has a BMI of Body mass index is 24.56 kg/m.Aaron Aas Filed Weights   04/15/24 1314  Weight: 71.1 kg    Current Outpatient Medications  Medication Sig Dispense Refill   amLODipine  (NORVASC ) 10 MG tablet Take 1 tablet (10 mg total) by mouth daily. 90 tablet 3   apixaban  (ELIQUIS ) 5 MG TABS tablet Take 1 tablet (5 mg total) by mouth 2 (two) times daily. 180 tablet 3   atorvastatin  (LIPITOR) 40 MG tablet TAKE 1 TABLET EVERY DAY 90 tablet 3   brimonidine  (ALPHAGAN ) 0.2 % ophthalmic solution Place 1 drop into both eyes in the morning and at bedtime.     chlorthalidone  (HYGROTON ) 25 MG tablet Take 1 tablet (25  mg total) by mouth daily. 90 tablet 3   Cholecalciferol (VITAMIN D ) 2000 units tablet Take 2,000 Units by mouth daily.     dorzolamide -timolol  (COSOPT ) 22.3-6.8 MG/ML ophthalmic solution Place 2 drops into both eyes 2 (two) times daily.     fenofibrate  (TRICOR ) 145 MG tablet TAKE 1 TABLET EVERY DAY 90 tablet 3   latanoprost  (XALATAN ) 0.005 % ophthalmic solution Place 1 drop into both eyes at bedtime.      Multiple Vitamin (MULTIVITAMIN) tablet Take 1 tablet by mouth daily.     Omega-3 Fatty Acids (OMEGA 3 PO) Take 1 capsule by mouth daily.     potassium chloride  SA (KLOR-CON  M) 20 MEQ tablet TAKE 1 TABLET EVERY DAY 90 tablet 3   valsartan  (DIOVAN ) 320 MG tablet TAKE 1 TABLET EVERY DAY 90 tablet 3   Zinc 50 MG CAPS Take 50 mg by mouth daily.     No current facility-administered medications for this encounter.    Atrial Fibrillation Management history:  Previous antiarrhythmic drugs: none Previous cardioversions: none Previous ablations: remote atrial flutter ablation 2015 Anticoagulation history: Eliquis  5 mg BID   ROS- All systems are reviewed and negative except as per the HPI above.  Physical Exam: BP (!) 170/68   Pulse 70   Ht 5\' 7"  (1.702 m)   Wt 71.1 kg   BMI 24.56 kg/m   GEN: Well nourished, well developed in no acute distress NECK: No JVD; No carotid bruits CARDIAC: Regular  rate and rhythm, no murmurs, rubs, gallops RESPIRATORY:  Clear to auscultation without rales, wheezing or rhonchi  ABDOMEN: Soft, non-tender, non-distended EXTREMITIES:  No edema; No deformity   EKG today demonstrates  Vent. rate 70 BPM PR interval 288 ms QRS duration 118 ms QT/QTcB 388/419 ms P-R-T axes 90 104 31  Suspect arm lead reversal, interpretation assumes no reversal Sinus rhythm with 1st degree A-V block with occasional Premature ventricular complexes Rightward axis Non-specific intra-ventricular conduction delay Nonspecific ST abnormality Abnormal ECG When compared with ECG of  30-Mar-2024 09:18, PREVIOUS ECG IS PRESENT  Echo 03/30/24 demonstrated  1. Left ventricular ejection fraction, by estimation, is 50%. The left  ventricle has low normal function. The left ventricle has no regional wall  motion abnormalities. Left ventricular diastolic parameters are  indeterminate.   2. Right ventricular systolic function is mildly reduced. The right  ventricular size is mildly enlarged. Tricuspid regurgitation signal is  inadequate for assessing PA pressure.   3. Left atrial size was mildly dilated.   4. The mitral valve is degenerative. Trivial mitral valve regurgitation.  No evidence of mitral stenosis.   5. The aortic valve is grossly normal. There is mild calcification of the  aortic valve. Aortic valve regurgitation is trivial. Aortic valve  sclerosis/calcification is present, without any evidence of aortic  stenosis.   6. The inferior vena cava is normal in size with greater than 50%  respiratory variability, suggesting right atrial pressure of 3 mmHg.    ASSESSMENT & PLAN CHA2DS2-VASc Score = 6  The patient's score is based upon: CHF History: 0 HTN History: 1 Diabetes History: 0 Stroke History: 2 Vascular Disease History: 1 Age Score: 2 Gender Score: 0       ASSESSMENT AND PLAN: Paroxysmal Atrial Fibrillation (ICD10:  I48.0) The patient's CHA2DS2-VASc score is 6, indicating a 9.7% annual risk of stroke.    He is currently in NSR. Education provided about Afib. Discussion about medication treatments if significant Afib burden is noted. We will place a 2 week Zio monitor to assess PAF burden. Will refer for sleep study due to snoring. Will not begin rate control at this time - coreg  discontinued in hospital due to Mobitz II second degree AV block.   Secondary Hypercoagulable State (ICD10:  D68.69) The patient is at significant risk for stroke/thromboembolism based upon his CHA2DS2-VASc Score of 6.  Continue Apixaban  (Eliquis ).  Discussion on risks vs  benefits of OAC. Patient will elect to continue Eliquis . Continue Eliquis  5 mg BID. Will draw CBC at next OV.    Follow up 6 weeks to discuss monitor results.    [name], PA-C  Afib Clinic [facility] [address] [place], [place] [address] [phone]

## 2024-04-22 ENCOUNTER — Telehealth: Payer: Self-pay | Admitting: Cardiovascular Disease

## 2024-04-22 ENCOUNTER — Telehealth: Payer: Self-pay | Admitting: Pharmacist

## 2024-04-22 NOTE — Telephone Encounter (Signed)
 Please see documentation in 4/30 telephone encounter (Blood pressure)

## 2024-04-22 NOTE — Telephone Encounter (Signed)
 Spoke to patient, tolerates chlorthalidone  well. His few readings for SBP started going in 130 range. Advised to continue taking it along with other 2 BP meds and follow up BMP is due on May 7. Will call on May 8 for follow up. Patient was advised to continue checking his BP at home.

## 2024-04-22 NOTE — Telephone Encounter (Signed)
 Patient identification verified by 2 forms. Hilton Lucky, RN    Called and spoke to patient  Patient states:    -Recently had a stroke (03/28/24)   -BP has been fluctuating   -he has a new BP machine ([employer]) left arm cuff   -he is home alone, has a hard time putting BP cuff on   -BP medications are managed by pharmacy   -he takes all BP medication in the morning 2 hours after medication  -checks BP on empty bladder, not talking, legs uncrossed on ground after sitting  Recent BP Log:   -4/21 BP: 174/77 Hr: 70   -4/22 BP: 182/86 Hr: 68  -4/23 BP: 157/71 HR: 68 (Started chlorthalidone  25 mg)   -4/24 BP: 143/76 Hr: 50  -4/25 BP: 145/72 Hr: 58  -4/26 BP: 157/67 HR: 57 at 10:00 AM    -4/26 BP: 165/75 HR: 47 at 5:00 PM  -4/27 BP: 134/61 HR: 51 at 10:30 AM   -4/28 BP: 134/62 Hr: 69   -4/29 BP: 165/?? (Did not write down full readings, was very upset)   -4/30 BP:  171/?? (Did not write down full reading, was very upset)  Patient denies:   -headaches   -chest pressure/pain   -SOB/difficulty breathing  -visual changes/disturbances  BP Medications:   -Amlodipine  10 mg once daily   -Chlorthalidone  25 mg once daily   -Valsartan  320 mg once daily  Informed patient message sent to provider and pharmacy team for input/advisement  Reviewed ED warning signs/precautions  Patient has no further questions at this time    Length of time on call 25 Minutes

## 2024-04-22 NOTE — Telephone Encounter (Signed)
 New Message:     Patient is asking to talk to you He said he was supposed to have called you on Monday He said he had problems getting through on Monday.

## 2024-04-22 NOTE — Telephone Encounter (Signed)
 Pt c/o BP issue:  1. What are your last 5 BP readings?  Yesterday 165 today 171,  wrist cuff 160/81 pulse 76 today,  04-20-24-   134/62 pulse 69, 04-19-24- 134/61 pulse 51 04-18-24 157/67    pulse 57  165/75 pulse 47                                       2. Are you having any other symptoms (ex. Dizziness, headache, blurred vision, passed out)?   No  3. What is your medication issue? Patient says  his blood pressure id fluctuating

## 2024-04-29 DIAGNOSIS — I10 Essential (primary) hypertension: Secondary | ICD-10-CM | POA: Diagnosis not present

## 2024-04-29 LAB — BASIC METABOLIC PANEL WITH GFR
BUN/Creatinine Ratio: 26 — ABNORMAL HIGH (ref 10–24)
BUN: 33 mg/dL — ABNORMAL HIGH (ref 8–27)
CO2: 18 mmol/L — ABNORMAL LOW (ref 20–29)
Calcium: 10.3 mg/dL — ABNORMAL HIGH (ref 8.6–10.2)
Chloride: 105 mmol/L (ref 96–106)
Creatinine, Ser: 1.29 mg/dL — ABNORMAL HIGH (ref 0.76–1.27)
Glucose: 91 mg/dL (ref 70–99)
Potassium: 4.9 mmol/L (ref 3.5–5.2)
Sodium: 138 mmol/L (ref 134–144)
eGFR: 54 mL/min/{1.73_m2} — ABNORMAL LOW (ref >59)

## 2024-05-01 NOTE — Telephone Encounter (Signed)
 Spoke to patient, discussed BM over the phone. Advised to continue current BP meds including Chlorthalidone . Advised to increase water intake. Reports his SBP still staying in 140 range. He has 2 meter not sure which one is accurate. Scheduled office visit with me on May 16 at 1:15 for HTN follow up. Will validate machine and review correct steps for home BP check.

## 2024-05-07 ENCOUNTER — Ambulatory Visit (HOSPITAL_COMMUNITY): Payer: Self-pay | Admitting: Internal Medicine

## 2024-05-07 ENCOUNTER — Other Ambulatory Visit (HOSPITAL_COMMUNITY): Payer: Self-pay | Admitting: *Deleted

## 2024-05-07 DIAGNOSIS — I480 Paroxysmal atrial fibrillation: Secondary | ICD-10-CM | POA: Diagnosis not present

## 2024-05-07 DIAGNOSIS — I495 Sick sinus syndrome: Secondary | ICD-10-CM

## 2024-05-07 NOTE — Addendum Note (Signed)
 Encounter addended by: Tess Fife, RN on: 05/07/2024 8:47 AM  Actions taken: Imaging Exam ended

## 2024-05-08 ENCOUNTER — Encounter: Payer: Self-pay | Admitting: Pharmacist

## 2024-05-08 ENCOUNTER — Ambulatory Visit: Attending: Internal Medicine | Admitting: Pharmacist

## 2024-05-08 VITALS — BP 144/71 | HR 67

## 2024-05-08 DIAGNOSIS — I10 Essential (primary) hypertension: Secondary | ICD-10-CM

## 2024-05-08 NOTE — Progress Notes (Signed)
 Patient ID: Xavier Bell                 DOB: [birth year]                      MRN: [id number]      HPI: Xavier Bell is a 83 y.o. male referred by Dr. Alroy Aspen to HTN clinic. PMH is significant for history of CVA, carotid stenosis, CAD s/p CABG, HTN, glaucoma, atrial flutter ablation (remote) in 2015, and atrial fibrillation.    Pt sent multiple MyChart reporting his home BP staying high. Post stroke early April 2025 his BP meds were changed and since then his BP remained elevated. Pt has 2 BP monitors at home and he says wrist one is easy to use and shows lower reading so he thinks that is accurate than Omron arm cuff. On 04/16 chlorthalidone  was restarted. Post BMP - renal function was stable and lytes was WNL except slight increment in calcium . BUN was elevated, hydration was discussed with the patient. Post stroke pt saw Dr. Alroy Aspen monitor was ordered   Today patient presented for BP follow up and brought in both home BP monitors for validation. His Omron machine is accurate and wrist machine some readings are off. Patient does not add salt to his diet.was walking but has not been. Motivated to restart. Given Afib reasonable to consider addition of betablocker which will lower BP too but patient dose not want to make any changes to his current BP meds as he will be seeing Dr.Camnitz on Tues May 21.   Current HTN meds: amlodipine  10 mg daily and Chlorthalidone  25 mg daily, valsartan  320 mg daily   BP goal: <130/80   Family History:  Relation Problem Comments  Mother (Deceased) Heart disease     Father (Deceased) Heart disease     Maternal Grandmother (Deceased)   Maternal Grandfather (Deceased)   Paternal Grandmother (Deceased)   Paternal Grandfather (Deceased)   Other Coronary artery disease     Social History:   Diet: follows low salt diet   Exercise: none. willing to restart walking    Home BP readings:  SBP DBP HR  149 68 65  156 72 61  153 65 57  153 64 55   156 76 64  156 68 66  148 74 58  156 68 61  132  66 57  151 69 60    Date SBP/DBP  HR              Average      Wt Readings from Last 3 Encounters:  04/15/24 156 lb 12.8 oz (71.1 kg)  04/03/24 155 lb 9.6 oz (70.6 kg)  03/29/24 156 lb 1.6 oz (70.8 kg)   BP Readings from Last 3 Encounters:  05/08/24 (!) 144/71  04/15/24 (!) 170/68  04/03/24 130/72   Pulse Readings from Last 3 Encounters:  05/08/24 67  04/15/24 70  04/03/24 82    Renal function: CrCl cannot be calculated (Unknown ideal weight.).  Past Medical History:  Diagnosis Date   Atrial flutter [facility])    s/p cardioversion 02/08/12; ablation by Dr Carolynne Citron [phone]   Chronic anticoagulation    Coronary artery disease    s/p remote MI in 1994 with multiple PCI and subsequent CABG x 5 in 1996   Dyslipidemia    Hypertension    MI (myocardial infarction) (HCC) 1995   Normal nuclear stress test 2006   Pseudoaphakia    both  eyes   Retinal detachment     Current Outpatient Medications on File Prior to Visit  Medication Sig Dispense Refill   amLODipine  (NORVASC ) 10 MG tablet Take 1 tablet (10 mg total) by mouth daily. 90 tablet 3   apixaban  (ELIQUIS ) 5 MG TABS tablet Take 1 tablet (5 mg total) by mouth 2 (two) times daily. 180 tablet 3   brimonidine  (ALPHAGAN ) 0.2 % ophthalmic solution Place 1 drop into both eyes in the morning and at bedtime.     chlorthalidone  (HYGROTON ) 25 MG tablet Take 1 tablet (25 mg total) by mouth daily. 90 tablet 3   Cholecalciferol (VITAMIN D ) 2000 units tablet Take 2,000 Units by mouth daily.     dorzolamide -timolol  (COSOPT ) 22.3-6.8 MG/ML ophthalmic solution Place 2 drops into both eyes 2 (two) times daily.     fenofibrate  (TRICOR ) 145 MG tablet TAKE 1 TABLET EVERY DAY 90 tablet 3   latanoprost  (XALATAN ) 0.005 % ophthalmic solution Place 1 drop into both eyes at bedtime.      Multiple Vitamin (MULTIVITAMIN) tablet Take 1 tablet by mouth daily.     Omega-3 Fatty Acids (OMEGA 3 PO) Take 1  capsule by mouth daily.     potassium chloride  SA (KLOR-CON  M) 20 MEQ tablet TAKE 1 TABLET EVERY DAY 90 tablet 3   valsartan  (DIOVAN ) 320 MG tablet TAKE 1 TABLET EVERY DAY 90 tablet 3   Zinc 50 MG CAPS Take 50 mg by mouth daily.     atorvastatin  (LIPITOR) 40 MG tablet TAKE 1 TABLET EVERY DAY 90 tablet 3   No current facility-administered medications on file prior to visit.    Allergies  Allergen Reactions   Niacin  And Related Other (See Comments)    Stomach Pain and flushing     Blood pressure (!) 144/71, pulse 67, SpO2 97%.   Assessment/Plan:  1. Hypertension -  Hypertension Assessment: BP is uncontrolled in office BP 144/71 mmHg heart rate 65  Takes and tolerates current BP meds well without any side effects Denies SOB, palpitation, chest pain, headaches,or swelling Follows low salt diet and willing to restart regular walks Home BP monitor validated Omron Arm cuff was accurate  Given atrial fibrilation reasonable to consider beta-blocker which will help to lower BP too but patient refuse to make any medication changes before he see Dr.Camnitz on Tues May 20.  Plan:  Continue taking amlodipine  10 mg daily and Chlorthalidone  25 mg daily, valsartan  320 mg daily   Patient to keep record of BP readings with heart rate and report to us  at the next visit Patient to see Dr.Camnitz on Tues 20/04/2024 Follow up lab(s): none     Thank you  Nickola Baron, Pharm.D Wasta Jeralene Mom. [facility] [address], [place], [place] [address] Phone: [phone]; Fax: [phone]

## 2024-05-08 NOTE — Assessment & Plan Note (Signed)
 Assessment: BP is uncontrolled in office BP 144/71 mmHg heart rate 65  Takes and tolerates current BP meds well without any side effects Denies SOB, palpitation, chest pain, headaches,or swelling Follows low salt diet and willing to restart regular walks Home BP monitor validated Omron Arm cuff was accurate  Given atrial fibrilation reasonable to consider beta-blocker which will help to lower BP too but patient refuse to make any medication changes before he see Dr.Camnitz on Tues May 20.  Plan:  Continue taking amlodipine  10 mg daily and Chlorthalidone  25 mg daily, valsartan  320 mg daily   Patient to keep record of BP readings with heart rate and report to us  at the next visit Patient to see Dr.Camnitz on Tues 20/04/2024 Follow up lab(s): none

## 2024-05-08 NOTE — Patient Instructions (Addendum)
 No medication Changes made by your pharmacist Nickola Baron, PharmD at today's visit:     Bring all of your meds, your BP cuff and your record of home blood pressures to your next appointment.    HOW TO TAKE YOUR BLOOD PRESSURE AT HOME  Rest 5 minutes before taking your blood pressure.  Don't smoke or drink caffeinated beverages for at least 30 minutes before. Take your blood pressure before (not after) you eat. Sit comfortably with your back supported and both feet on the floor (don't cross your legs). Elevate your arm to heart level on a table or a desk. Use the proper sized cuff. It should fit smoothly and snugly around your bare upper arm. There should be enough room to slip a fingertip under the cuff. The bottom edge of the cuff should be 1 inch above the crease of the elbow. Ideally, take 3 measurements at one sitting and record the average.  Important lifestyle changes to control high blood pressure  Intervention  Effect on the BP  Lose extra pounds and watch your waistline Weight loss is one of the most effective lifestyle changes for controlling blood pressure. If you're overweight or obese, losing even a small amount of weight can help reduce blood pressure. Blood pressure might go down by about 1 millimeter of mercury (mm Hg) with each kilogram (about 2.2 pounds) of weight lost.  Exercise regularly As a general goal, aim for at least 30 minutes of moderate physical activity every day. Regular physical activity can lower high blood pressure by about 5 to 8 mm Hg.  Eat a healthy diet Eating a diet rich in whole grains, fruits, vegetables, and low-fat dairy products and low in saturated fat and cholesterol. A healthy diet can lower high blood pressure by up to 11 mm Hg.  Reduce salt (sodium) in your diet Even a small reduction of sodium in the diet can improve heart health and reduce high blood pressure by about 5 to 6 mm Hg.  Limit alcohol One drink equals 12 ounces of beer, 5  ounces of wine, or 1.5 ounces of 80-proof liquor.  Limiting alcohol to less than one drink a day for women or two drinks a day for men can help lower blood pressure by about 4 mm Hg.   If you have any questions or concerns please use My Chart to send questions or call the office at [phone]

## 2024-05-12 ENCOUNTER — Encounter: Payer: Self-pay | Admitting: Cardiology

## 2024-05-12 ENCOUNTER — Ambulatory Visit: Attending: Cardiology | Admitting: Cardiology

## 2024-05-12 VITALS — BP 140/66 | HR 55 | Ht 67.0 in | Wt 156.0 lb

## 2024-05-12 DIAGNOSIS — D6869 Other thrombophilia: Secondary | ICD-10-CM | POA: Diagnosis not present

## 2024-05-12 DIAGNOSIS — I2510 Atherosclerotic heart disease of native coronary artery without angina pectoris: Secondary | ICD-10-CM

## 2024-05-12 DIAGNOSIS — I495 Sick sinus syndrome: Secondary | ICD-10-CM | POA: Diagnosis not present

## 2024-05-12 DIAGNOSIS — I480 Paroxysmal atrial fibrillation: Secondary | ICD-10-CM | POA: Diagnosis not present

## 2024-05-12 DIAGNOSIS — I10 Essential (primary) hypertension: Secondary | ICD-10-CM

## 2024-05-12 MED ORDER — AMIODARONE HCL 200 MG PO TABS: ORAL_TABLET | ORAL | 2 refills | Status: DC

## 2024-05-12 NOTE — Patient Instructions (Addendum)
 Medication Instructions:  Your physician has recommended you make the following change in your medication:  START Amiodarone  - take 1 tablet (200 mg total) TWICE a day for 1 month, then  - take 1 tablet (200 mg total) ONCE a day thereafter   *If you need a refill on your cardiac medications before your next appointment, please call your pharmacy*  Lab Work: None ordered  If you have any lab test that is abnormal or we need to change your treatment, we will call you to review the results.  Testing/Procedures: None ordered  Follow-Up: At [facility], you and your health needs are our priority.  As part of our continuing mission to provide you with exceptional heart care, our providers are all part of one team.  This team includes your primary Cardiologist (physician) and Advanced Practice Providers or APPs (Physician Assistants and Nurse Practitioners) who all work together to provide you with the care you need, when you need it.  Your next appointment:   1 month(s)  Provider:   You will follow up in the Atrial Fibrillation Clinic located at [facility]. Your provider will be: Clint R. Fenton, PA-C or [name], PA-C     Thank you for choosing [employer]!!   Reece Cane, RN [phone]   Other Instructions   Amiodarone Tablets What is this medication? AMIODARONE (a MEE oh da rone) prevents and treats a fast or irregular heartbeat (arrhythmia). It works by slowing down overactive electric signals in the heart, which stabilizes your heart rhythm. It belongs to a group of medications called antiarrhythmics. This medicine may be used for other purposes; ask your health care provider or pharmacist if you have questions. COMMON BRAND NAME(S): Cordarone, Pacerone What should I tell my care team before I take this medication? They need to know if you have any of these conditions: Liver disease Lung disease Other heart problems Thyroid disease An  unusual or allergic reaction to amiodarone, iodine, other medications, foods, dyes, or preservatives Pregnant or trying to get pregnant Breast-feeding How should I use this medication? Take this medication by mouth with water. Take it as directed on the prescription label at the same time every day. You can take it with or without food. You should always take it the same way. Keep taking it unless your care team tells you to stop. A special MedGuide will be given to you by the pharmacist with each prescription and refill. Be sure to read this information carefully each time. Talk to your care team about the use of this medication in children. Special care may be needed. Overdosage: If you think you have taken too much of this medicine contact a poison control center or emergency room at once. NOTE: This medicine is only for you. Do not share this medicine with others. What if I miss a dose? If you miss a dose, take it as soon as you can. If it is almost time for your next dose, take only that dose. Do not take double or extra doses. What may interact with this medication? Do not take this medication with any of the following: Abarelix Apomorphine Arsenic trioxide Certain antibiotics, such as erythromycin, gemifloxacin, levofloxacin, or pentamidine Certain medications for depression, such as amoxapine or tricyclic antidepressants Certain medications for fungal infections, such as fluconazole, itraconazole, ketoconazole, posaconazole, or voriconazole Certain medications for irregular heartbeat, such as disopyramide, dronedarone, ibutilide, propafenone, or sotalol Certain medications for malaria, such as chloroquine or halofantrine Cisapride  Droperidol Haloperidol [place]orn Maprotiline Methadone Phenothiazines, such as chlorpromazine, mesoridazine, or thioridazine Pimozide Ranolazine Red yeast rice Vardenafil This medication may also interact with the following: Antivirals for  HIV Certain medications for blood pressure, heart disease, irregular heartbeat Certain medications for cholesterol, such as atorvastatin , cerivastatin, lovastatin, or simvastatin Certain medications for hepatitis C, such as sofosbuvir and ledipasvir; sofosbuvir Certain medications for seizures, such as phenytoin Certain medications for thyroid problems Certain medications that prevent or treat blood clots, such as warfarin Cholestyramine Cimetidine Clopidogrel  Cyclosporine Dextromethorphan Diuretics Dofetilide Fentanyl  General anesthetics Grapefruit juice Lidocaine  Loratadine Methotrexate Other medications that cause heart rhythm changes Procainamide Quinidine Rifabutin, rifampin, or rifapentine St. John's Wort Trazodone Ziprasidone This list may not describe all possible interactions. Give your health care provider a list of all the medicines, herbs, non-prescription drugs, or dietary supplements you use. Also tell them if you smoke, drink alcohol, or use illegal drugs. Some items may interact with your medicine. What should I watch for while using this medication? Your condition will be monitored closely when you first begin therapy. This medication is often started in a hospital or other monitored health care setting. Once you are on maintenance therapy, visit your care team for regular checks on your progress. Because your condition and use of this medication carry some risk, it is a good idea to carry an identification card, necklace, or bracelet with details of your condition, medications, and care team. This medication may affect your coordination, reaction time, or judgment. Do not drive or operate machinery until you know how this medication affects you. Sit up or stand slowly to reduce the risk of dizzy or fainting spells. Drinking alcohol with this medication can increase the risk of these side effects. This medication can make you more sensitive to the sun. Keep out of the  sun. If you cannot avoid being in the sun, wear protective clothing and sunscreen. Do not use sun lamps, tanning beds, or tanning booths. You should have regular eye exams before and during treatment. Call your care team if you have blurred vision, see halos, or your eyes become sensitive to light. Your eyes may get dry. It may be helpful to use a lubricating eye solution or artificial tears solution. If you are going to have surgery or a procedure that requires contrast dyes, tell your care team that you are taking this medication. What side effects may I notice from receiving this medication? Side effects that you should report to your care team as soon as possible: Allergic reactions--skin rash, itching, hives, swelling of the face, lips, tongue, or throat Bluish-gray skin Change in vision such as blurry vision, seeing halos around lights, vision loss Heart failure--shortness of breath, swelling of the ankles, feet, or hands, sudden weight gain, unusual weakness or fatigue Heart rhythm changes--fast or irregular heartbeat, dizziness, feeling faint or lightheaded, chest pain, trouble breathing High thyroid levels (hyperthyroidism)--fast or irregular heartbeat, weight loss, excessive sweating or sensitivity to heat, tremors or shaking, anxiety, nervousness, irregular menstrual cycle or spotting Liver injury--right upper belly pain, loss of appetite, nausea, light-colored stool, dark yellow or brown urine, yellowing skin or eyes, unusual weakness or fatigue Low thyroid levels (hypothyroidism)--unusual weakness or fatigue, sensitivity to cold, constipation, hair loss, dry skin, weight gain, feelings of depression Lung injury--shortness of breath or trouble breathing, cough, spitting up blood, chest pain, fever Pain, tingling, or numbness in the hands or feet, muscle weakness, trouble walking, loss of balance or coordination Side effects that usually do not require  medical attention (report to your care  team if they continue or are bothersome): Nausea Vomiting This list may not describe all possible side effects. Call your doctor for medical advice about side effects. You may report side effects to FDA at 1-800-FDA-1088. Where should I keep my medication? Keep out of the reach of children and pets. Store at room temperature between 20 and 25 degrees C (68 and 77 degrees F). Protect from light. Keep container tightly closed. Throw away any unused medication after the expiration date. NOTE: This sheet is a summary. It may not cover all possible information. If you have questions about this medicine, talk to your doctor, pharmacist, or health care provider.  2024 Elsevier/Gold Standard (2022-06-29 00:00:00)

## 2024-05-12 NOTE — Progress Notes (Signed)
 Electrophysiology Office Note:   Date:  05/12/2024  ID:  Xavier Bell [birth year], MRN [id number]  Primary Cardiologist: Xavier Alert, MD Primary Heart Failure: None Electrophysiologist: Xavier Barb Cortland Ding, MD      History of Present Illness:   Xavier Bell is a 83 y.o. male with h/o CVA, carotid stenosis, coronary artery disease post CABG, hypertension, glaucoma, atrial flutter post ablation in 2015, atrial fibrillation seen today for  for Electrophysiology evaluation of atrial fibrillation at the request of Xavier Bell.    He was admitted to the hospital for 525 for acute CVA.  He was found to be in atrial fibrillation at that time.  He was started on Eliquis .  He wore a cardiac monitor which showed a 74% atrial fibrillation burden and multiple pauses, mostly nocturnal.  Today, denies symptoms of palpitations, chest pain, shortness of breath, orthopnea, PND, lower extremity edema, claudication, dizziness, presyncope, syncope, bleeding, or neurologic sequela. The patient is tolerating medications without difficulties.  He has improved from his CVA.  He has having some minor word finding difficulties, but otherwise has no acute complaints.  He does have some fatigue that happens in the afternoons with some mild shortness of breath.  Aside from that, he he does not think that he is not very symptomatic from his atrial fibrillation.  He did have pauses on his monitor.  He goes to sleep at 8 in the evening.  He wakes up around 1 or 2 and dozes until around 6:00.  He has not had any syncope or near syncope.   Review of systems complete and found to be negative unless listed in HPI.   EP Information / Studies Reviewed:    EKG is ordered today. Personal review as below.  EKG Interpretation Date/Time:  Tuesday May 12 2024 08:53:48 EDT Ventricular Rate:  55 PR Interval:    QRS Duration:  128 QT Interval:  420 QTC Calculation: 401 R Axis:   99  Text Interpretation: Atrial  fibrillation with slow ventricular response with premature ventricular or aberrantly conducted complexes Rightward axis Non-specific intra-ventricular conduction block Minimal voltage criteria for LVH, may be normal variant ( Cornell product ) Nonspecific T wave abnormality When compared with ECG of 15-Apr-2024 13:25, Atrial fibrillation has replaced Sinus rhythm Nonspecific T wave abnormality now evident in Anterolateral leads Confirmed by Xavier Bell ([id number]) on 05/12/2024 8:58:22 AM     Risk Assessment/Calculations:    CHA2DS2-VASc Score = 6   This indicates a 9.7% annual risk of stroke. The patient's score is based upon: CHF History: 0 HTN History: 1 Diabetes History: 0 Stroke History: 2 Vascular Disease History: 1 Age Score: 2 Gender Score: 0            Physical Exam:   VS:  BP (!) 140/66 (BP Location: Right Arm, Patient Position: Sitting, Cuff Size: Normal)   Pulse (!) 55   Ht 5\' 7"  (1.702 m)   Wt 156 lb (70.8 kg)   SpO2 96%   BMI 24.43 kg/m    Wt Readings from Last 3 Encounters:  05/12/24 156 lb (70.8 kg)  04/15/24 156 lb 12.8 oz (71.1 kg)  04/03/24 155 lb 9.6 oz (70.6 kg)     GEN: Well nourished, well developed in no acute distress NECK: No JVD; No carotid bruits CARDIAC: Irregularly irregular rate and rhythm, no murmurs, rubs, gallops RESPIRATORY:  Clear to auscultation without rales, wheezing or rhonchi  ABDOMEN: Soft, non-tender, non-distended EXTREMITIES:  No edema; No deformity  ASSESSMENT AND PLAN:    1.  Paroxysmal atrial fibrillation: Elevated burden of atrial fibrillation on his monitor.  He has some fatigue and shortness of breath.  Rhythm control would be beneficial.  Xavier Bell start amiodarone 200 mg twice daily for a month followed by 200 mg daily.  We Xavier Bell have him follow-up in A-fib clinic in a month.  If he is in atrial fibrillation at that point, may require cardioversion.  2.  Secondary hypercoagulable state: On Eliquis  for atrial fibrillation  3.   Coronary artery disease: Post CABG.  No current chest pain.  4.  Hypertension: Followed by general cardiology  5.  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  These likely occurred when the patient was sleeping.  He has not had any worrisome signs during waking hours.  Xavier Bell continue to monitor.  Follow up with Afib Clinic in 4 weeks  Signed, Xavier Hillmann Cortland Ding, MD

## 2024-05-12 NOTE — Addendum Note (Signed)
 Addended by: Alvenia Aus on: 05/12/2024 09:19 AM   Modules accepted: Orders

## 2024-05-15 ENCOUNTER — Telehealth: Payer: Self-pay

## 2024-05-15 NOTE — Telephone Encounter (Signed)
 This patient is appearing on a report for being at risk of failing the adherence measure for hypertension (ACEi/ARB) medications this calendar year.   Medication: valsartan  320 mg Last fill date: 01/03/24 for 90 day supply  Spoke with patient who stated he has about a 10 day supply left of medication at home. Notes the pharmacy usually sends him extra bottles when he fills through [facility]. Patient agreeable to letting me go ahead and call the pharmacy to start on the next refill. Contacted pharmacy to facilitate refill.  Abelina Abide, PharmD PGY1 Pharmacy Resident 05/15/2024 4:37 PM

## 2024-05-20 ENCOUNTER — Telehealth: Payer: Self-pay | Admitting: Cardiology

## 2024-05-20 NOTE — Telephone Encounter (Signed)
 There is a small chance amiodarone can increase the concentration of atorvastatin . This is just something that we monitor for s/sx of statin side effects. Likelihood is low.

## 2024-05-20 NOTE — Telephone Encounter (Signed)
 Pt aware of the likelihood of side effects and is willing to take Atorvastatin  in addition to the Amiodarone  and will monitor and  call if does not tolerate ./cy

## 2024-05-20 NOTE — Telephone Encounter (Signed)
 Pt c/o medication issue:  1. Name of Medication:   amiodarone (PACERONE) 200 MG tablet    2. How are you currently taking this medication (dosage and times per day)? As written   3. Are you having a reaction (difficulty breathing--STAT)? No   4. What is your medication issue? Pt called in stating he is having some issues this medication he wants to speak with nurse about it.

## 2024-05-20 NOTE — Telephone Encounter (Signed)
 Spoke with pt at length and the main reason for phone call is Pt was recently started on Amiodarone 200 mg bid and first dose was taken last night Pt seen that there is a contraindication with Amiodarone and statins Will forward to PharmD for review Also pt noted a higher B/P (171/76 HR 75 ) this am than usual Pt instructed to continue to monitor B/P and if notes consistently high to call office Pt verbalized understanding Also noted a trembling sensation to arms "chilled feeling " will continue to monitor ./cy

## 2024-06-03 ENCOUNTER — Ambulatory Visit: Admitting: Nurse Practitioner

## 2024-06-12 ENCOUNTER — Ambulatory Visit (HOSPITAL_COMMUNITY)
Admission: RE | Admit: 2024-06-12 | Discharge: 2024-06-12 | Disposition: A | Discharge status: Home / Self Care | Source: Ambulatory Visit | Attending: Internal Medicine | Admitting: Internal Medicine

## 2024-06-12 VITALS — BP 170/62 | HR 63 | Ht 67.0 in | Wt 156.6 lb

## 2024-06-12 DIAGNOSIS — I480 Paroxysmal atrial fibrillation: Secondary | ICD-10-CM | POA: Diagnosis not present

## 2024-06-12 DIAGNOSIS — D6869 Other thrombophilia: Secondary | ICD-10-CM

## 2024-06-12 DIAGNOSIS — Z5181 Encounter for therapeutic drug level monitoring: Secondary | ICD-10-CM | POA: Diagnosis not present

## 2024-06-12 DIAGNOSIS — I4891 Unspecified atrial fibrillation: Secondary | ICD-10-CM | POA: Diagnosis not present

## 2024-06-12 DIAGNOSIS — Z79899 Other long term (current) drug therapy: Secondary | ICD-10-CM | POA: Diagnosis not present

## 2024-06-12 NOTE — Patient Instructions (Signed)
 REMINDER next week decrease amiodarone  to once daily

## 2024-06-12 NOTE — Progress Notes (Addendum)
 Primary Care Physician: No primary care provider on file. Primary Cardiologist: Ahmad Alert, MD Electrophysiologist: Will Cortland Ding, MD     Referring Physician: Dr. Carlota Chestnut is a 83 y.o. male with a history of CVA, carotid stenosis, CAD s/p CABG, HTN, glaucoma, atrial flutter ablation (remote) in 2015, and atrial fibrillation who presents for consultation in the [facility]. Hospital admission 4/5-7/25 for acute CVA and found to be in new Afib. 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 Patient is on Eliquis  5 mg BID for a CHADS2VASC score of 6.  On evaluation today, he is currently in NSR. He began chlorthalidone  25 mg daily as of earlier today due to elevated BP since coreg  was discontinued. He snores per wife. He does not drink alcohol. He drinks 1-2 cups of coffee daily. He has a flip phone. He has not missed any doses of Eliquis  5 mg BID.   On follow up 06/12/24, he is currently in NSR. Since last office visit, cardiac monitor resulted with 74% Afib burden and 1155 pauses (mainly at night). No indication for PPM at this time. He was started on amiodarone  and is currently taking 200 mg twice daily. His amiodarone  load will complete next week and he will transition to once daily. No missed doses of Eliquis .   Today, he denies symptoms of palpitations, chest pain, shortness of breath, orthopnea, PND, lower extremity edema, dizziness, presyncope, syncope, snoring, daytime somnolence, bleeding, or neurologic sequela. The patient is tolerating medications without difficulties and is otherwise without complaint today.    Atrial Fibrillation Risk Factors:  he does have symptoms or diagnosis of sleep apnea.  he has a BMI of Body mass index is 24.53 kg/m.Aaron Aas Filed Weights   06/12/24 0930  Weight: 71 kg     Current Outpatient Medications  Medication Sig Dispense Refill   amiodarone  (PACERONE ) 200 MG tablet Take 1 tablet  (200 mg total) TWICE a day for 2 weeks, then take 1 tablet (200 mg total) ONCE a day thereafter 90 tablet 2   amLODipine  (NORVASC ) 10 MG tablet Take 1 tablet (10 mg total) by mouth daily. 90 tablet 3   apixaban  (ELIQUIS ) 5 MG TABS tablet Take 1 tablet (5 mg total) by mouth 2 (two) times daily. 180 tablet 3   atorvastatin  (LIPITOR) 40 MG tablet TAKE 1 TABLET EVERY DAY 90 tablet 3   brimonidine  (ALPHAGAN ) 0.2 % ophthalmic solution Place 1 drop into both eyes in the morning and at bedtime.     chlorthalidone  (HYGROTON ) 25 MG tablet Take 1 tablet (25 mg total) by mouth daily. 90 tablet 3   Cholecalciferol (VITAMIN D ) 2000 units tablet Take 2,000 Units by mouth daily.     dorzolamide -timolol  (COSOPT ) 22.3-6.8 MG/ML ophthalmic solution Place 2 drops into both eyes 2 (two) times daily.     fenofibrate  (TRICOR ) 145 MG tablet TAKE 1 TABLET EVERY DAY 90 tablet 3   latanoprost  (XALATAN ) 0.005 % ophthalmic solution Place 1 drop into both eyes at bedtime.      Multiple Vitamin (MULTIVITAMIN) tablet Take 1 tablet by mouth daily.     Omega-3 Fatty Acids (OMEGA 3 PO) Take 1 capsule by mouth daily.     potassium chloride  SA (KLOR-CON  M) 20 MEQ tablet TAKE 1 TABLET EVERY DAY 90 tablet 3   valsartan  (DIOVAN ) 320 MG tablet TAKE 1 TABLET EVERY DAY 90 tablet 3   Zinc 50 MG CAPS Take 50  mg by mouth daily.     No current facility-administered medications for this encounter.    Atrial Fibrillation Management history:  Previous antiarrhythmic drugs: amiodarone  Previous cardioversions: none Previous ablations: remote atrial flutter ablation 2015 Anticoagulation history: Eliquis  5 mg BID   ROS- All systems are reviewed and negative except as per the HPI above.  Physical Exam: BP (!) 170/62   Pulse 63   Ht 5' 7 (1.702 m)   Wt 71 kg   BMI 24.53 kg/m   GEN- The patient is well appearing, alert and oriented x 3 today.   Neck - no JVD or carotid bruit noted Lungs- Clear to ausculation bilaterally, normal work  of breathing Heart- Regular rate and rhythm, no murmurs, rubs or gallops, PMI not laterally displaced Extremities- no clubbing, cyanosis, or edema Skin - no rash or ecchymosis noted   EKG today demonstrates  Vent. rate 63 BPM PR interval 302 ms QRS duration 122 ms QT/QTcB 434/444 ms P-R-T axes 82 106 29 Sinus rhythm with 1st degree A-V block Rightward axis Non-specific intra-ventricular conduction delay Nonspecific ST abnormality Abnormal ECG When compared with ECG of 12-May-2024 08:53, Sinus rhythm has replaced Atrial fibrillation   Echo 03/30/24 demonstrated  1. Left ventricular ejection fraction, by estimation, is 50%. The left  ventricle has low normal function. The left ventricle has no regional wall  motion abnormalities. Left ventricular diastolic parameters are  indeterminate.   2. Right ventricular systolic function is mildly reduced. The right  ventricular size is mildly enlarged. Tricuspid regurgitation signal is  inadequate for assessing PA pressure.   3. Left atrial size was mildly dilated.   4. The mitral valve is degenerative. Trivial mitral valve regurgitation.  No evidence of mitral stenosis.   5. The aortic valve is grossly normal. There is mild calcification of the  aortic valve. Aortic valve regurgitation is trivial. Aortic valve  sclerosis/calcification is present, without any evidence of aortic  stenosis.   6. The inferior vena cava is normal in size with greater than 50%  respiratory variability, suggesting right atrial pressure of 3 mmHg.   Cardiac monitor 4-04/2024: Patch Wear Time:  13 days and 17 hours   HR 21 - 154, average 51 bpm. Atrial fibrillation detected. 74% atrial fibrillation burden, longest 6 days 16 hours with average heart rate of 50 bpm Rare supraventricular ectopy. Rare ventricular ectopy. 1155 pauses, longest 5.1 seconds, all between 10 PM and 3 AM 1 episode complete heart block lasting 12 seconds at 11 PM No symptom trigger  episodes.     Will Cortland Ding Cardiac Electrophysiology   ASSESSMENT & PLAN CHA2DS2-VASc Score = 6  The patient's score is based upon: CHF History: 0 HTN History: 1 Diabetes History: 0 Stroke History: 2 Vascular Disease History: 1 Age Score: 2 Gender Score: 0       ASSESSMENT AND PLAN: Paroxysmal Atrial Fibrillation (ICD10:  I48.0) The patient's CHA2DS2-VASc score is 6, indicating a 9.7% annual risk of stroke.    He is currently in NSR.   High risk medication monitoring (ICD10: U5195107) Patient requires ongoing monitoring for anti-arrhythmic medication which has the potential to cause life threatening arrhythmias or AV block. Qtc stable. Continue amiodarone  200 mg twice daily and transition to 200 mg once daily next week as prescribed.   Secondary Hypercoagulable State (ICD10:  D68.69) The patient is at significant risk for stroke/thromboembolism based upon his CHA2DS2-VASc Score of 6.  Continue Apixaban  (Eliquis ).  Continue Eliquis  5 mg BID.  Follow up 3 months for amiodarone  surveillance.    [name], PA-C  Afib Clinic [facility] [address] [place], [place] [address] [phone]

## 2024-06-19 ENCOUNTER — Ambulatory Visit: Admitting: Nurse Practitioner

## 2024-07-15 DIAGNOSIS — H401111 Primary open-angle glaucoma, right eye, mild stage: Secondary | ICD-10-CM | POA: Diagnosis not present

## 2024-07-15 DIAGNOSIS — H401122 Primary open-angle glaucoma, left eye, moderate stage: Secondary | ICD-10-CM | POA: Diagnosis not present

## 2024-09-11 ENCOUNTER — Encounter (HOSPITAL_COMMUNITY): Payer: Self-pay | Admitting: Internal Medicine

## 2024-09-11 ENCOUNTER — Ambulatory Visit (HOSPITAL_COMMUNITY)
Admission: RE | Admit: 2024-09-11 | Discharge: 2024-09-11 | Disposition: A | Discharge status: Home / Self Care | Source: Ambulatory Visit | Attending: Internal Medicine | Admitting: Internal Medicine

## 2024-09-11 VITALS — BP 158/68 | HR 64 | Ht 67.0 in | Wt 157.2 lb

## 2024-09-11 DIAGNOSIS — Z5181 Encounter for therapeutic drug level monitoring: Secondary | ICD-10-CM

## 2024-09-11 DIAGNOSIS — I480 Paroxysmal atrial fibrillation: Secondary | ICD-10-CM | POA: Diagnosis not present

## 2024-09-11 DIAGNOSIS — Z79899 Other long term (current) drug therapy: Secondary | ICD-10-CM | POA: Diagnosis not present

## 2024-09-11 DIAGNOSIS — D6869 Other thrombophilia: Secondary | ICD-10-CM

## 2024-09-11 NOTE — Progress Notes (Signed)
 Primary Care Physician: No primary care provider on file. Primary Cardiologist: Aleene Passe, MD (Inactive) Electrophysiologist: Will Gladis Norton, MD     Referring Physician: Dr. Raford Milford Xavier Bell is a 83 y.o. male with a history of CVA, carotid stenosis, CAD s/p CABG, HTN, glaucoma, atrial flutter ablation (remote) in 2015, and atrial fibrillation who presents for consultation in the [facility]. Hospital admission 4/5-7/25 for acute CVA and found to be in new Afib. 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 Patient is on Eliquis  5 mg BID for a CHADS2VASC score of 6.  On evaluation today, he is currently in NSR. He began chlorthalidone  25 mg daily as of earlier today due to elevated BP since coreg  was discontinued. He snores per wife. He does not drink alcohol. He drinks 1-2 cups of coffee daily. He has a flip phone. He has not missed any doses of Eliquis  5 mg BID.   On follow up 06/12/24, he is currently in NSR. Since last office visit, cardiac monitor resulted with 74% Afib burden and 1155 pauses (mainly at night). No indication for PPM at this time. He was started on amiodarone  and is currently taking 200 mg twice daily. His amiodarone  load will complete next week and he will transition to once daily. No missed doses of Eliquis .   On follow up 09/11/24, patient is here for amiodarone  surveillance. He is currently in NSR. No issues with amiodarone  or bleeding noted on Eliquis . He notes home BP recordings with systolic readings in the 130-140s.  Today, he denies symptoms of palpitations, chest pain, shortness of breath, orthopnea, PND, lower extremity edema, dizziness, presyncope, syncope, snoring, daytime somnolence, bleeding, or neurologic sequela. The patient is tolerating medications without difficulties and is otherwise without complaint today.    Atrial Fibrillation Risk Factors:  he does have symptoms or diagnosis of sleep  apnea.  he has a BMI of Body mass index is 24.62 kg/m.SABRA Filed Weights   09/11/24 0943  Weight: 71.3 kg    Current Outpatient Medications  Medication Sig Dispense Refill   amiodarone  (PACERONE ) 200 MG tablet Take 1 tablet (200 mg total) TWICE a day for 2 weeks, then take 1 tablet (200 mg total) ONCE a day thereafter 90 tablet 2   amLODipine  (NORVASC ) 10 MG tablet Take 1 tablet (10 mg total) by mouth daily. 90 tablet 3   apixaban  (ELIQUIS ) 5 MG TABS tablet Take 1 tablet (5 mg total) by mouth 2 (two) times daily. 180 tablet 3   atorvastatin  (LIPITOR) 40 MG tablet TAKE 1 TABLET EVERY DAY 90 tablet 3   brimonidine  (ALPHAGAN ) 0.2 % ophthalmic solution Place 1 drop into both eyes in the morning and at bedtime.     chlorthalidone  (HYGROTON ) 25 MG tablet Take 1 tablet (25 mg total) by mouth daily. 90 tablet 3   Cholecalciferol (VITAMIN D ) 2000 units tablet Take 2,000 Units by mouth daily.     dorzolamide -timolol  (COSOPT ) 22.3-6.8 MG/ML ophthalmic solution Place 2 drops into both eyes 2 (two) times daily.     fenofibrate  (TRICOR ) 145 MG tablet TAKE 1 TABLET EVERY DAY 90 tablet 3   latanoprost  (XALATAN ) 0.005 % ophthalmic solution Place 1 drop into both eyes at bedtime.      Multiple Vitamin (MULTIVITAMIN) tablet Take 1 tablet by mouth daily.     Omega-3 Fatty Acids (OMEGA 3 PO) Take 1 capsule by mouth daily.     potassium chloride  SA (  KLOR-CON  M) 20 MEQ tablet TAKE 1 TABLET EVERY DAY 90 tablet 3   valsartan  (DIOVAN ) 320 MG tablet TAKE 1 TABLET EVERY DAY 90 tablet 3   Zinc 50 MG CAPS Take 50 mg by mouth daily.     No current facility-administered medications for this encounter.    Atrial Fibrillation Management history:  Previous antiarrhythmic drugs: amiodarone  Previous cardioversions: none Previous ablations: remote atrial flutter ablation 2015 Anticoagulation history: Eliquis  5 mg BID   ROS- All systems are reviewed and negative except as per the HPI above.  Physical Exam: BP (!)  158/68   Pulse 64   Ht 5' 7 (1.702 m)   Wt 71.3 kg   BMI 24.62 kg/m   GEN- The patient is well appearing, alert and oriented x 3 today.   Neck - no JVD or carotid bruit noted Lungs- Clear to ausculation bilaterally, normal work of breathing Heart- Regular rate and rhythm, no murmurs, rubs or gallops, PMI not laterally displaced Extremities- no clubbing, cyanosis, or edema Skin - no rash or ecchymosis noted    EKG today demonstrates  Vent. rate 64 BPM PR interval 296 ms QRS duration 124 ms QT/QTcB 424/437 ms P-R-T axes 75 113 26 Sinus rhythm with 1st degree A-V block Left posterior fascicular block Left ventricular hypertrophy with QRS widening and repolarization abnormality ( Cornell product ) Abnormal ECG When compared with ECG of 12-Jun-2024 09:38, No significant change was found   Echo 03/30/24 demonstrated  1. Left ventricular ejection fraction, by estimation, is 50%. The left  ventricle has low normal function. The left ventricle has no regional wall  motion abnormalities. Left ventricular diastolic parameters are  indeterminate.   2. Right ventricular systolic function is mildly reduced. The right  ventricular size is mildly enlarged. Tricuspid regurgitation signal is  inadequate for assessing PA pressure.   3. Left atrial size was mildly dilated.   4. The mitral valve is degenerative. Trivial mitral valve regurgitation.  No evidence of mitral stenosis.   5. The aortic valve is grossly normal. There is mild calcification of the  aortic valve. Aortic valve regurgitation is trivial. Aortic valve  sclerosis/calcification is present, without any evidence of aortic  stenosis.   6. The inferior vena cava is normal in size with greater than 50%  respiratory variability, suggesting right atrial pressure of 3 mmHg.   Cardiac monitor 4-04/2024: Patch Wear Time:  13 days and 17 hours   HR 21 - 154, average 51 bpm. Atrial fibrillation detected. 74% atrial fibrillation  burden, longest 6 days 16 hours with average heart rate of 50 bpm Rare supraventricular ectopy. Rare ventricular ectopy. 1155 pauses, longest 5.1 seconds, all between 10 PM and 3 AM 1 episode complete heart block lasting 12 seconds at 11 PM No symptom trigger episodes.     Will Gladis Norton Cardiac Electrophysiology   ASSESSMENT & PLAN CHA2DS2-VASc Score = 6  The patient's score is based upon: CHF History: 0 HTN History: 1 Diabetes History: 0 Stroke History: 2 Vascular Disease History: 1 Age Score: 2 Gender Score: 0       ASSESSMENT AND PLAN: Paroxysmal Atrial Fibrillation (ICD10:  I48.0) The patient's CHA2DS2-VASc score is 6, indicating a 9.7% annual risk of stroke.    Patient is currently in NSR. He is overall happy with current management. Patient notes he does not have a current cardiologist as Dr. Alveta has retired. Will help patient establish with new cardiologist.  High risk medication monitoring (ICD10: U5195107) Patient requires ongoing  monitoring for anti-arrhythmic medication which has the potential to cause life threatening arrhythmias or AV block. Qtc stable. Continue amiodarone  200 mg daily. Cmet and TSH drawn today.   Secondary Hypercoagulable State (ICD10:  D68.69) The patient is at significant risk for stroke/thromboembolism based upon his CHA2DS2-VASc Score of 6.  Continue Apixaban  (Eliquis ).  Continue Eliquis .     Follow up 6 months for amiodarone  surveillance.    Terra Pac, PA-C  Afib Clinic [facility] [address] [place], [place] [address] [phone]

## 2024-09-12 LAB — COMPREHENSIVE METABOLIC PANEL WITH GFR
ALT: 33 IU/L (ref 0–44)
AST: 38 IU/L (ref 0–40)
Albumin: 4.7 g/dL (ref 3.7–4.7)
Alkaline Phosphatase: 97 IU/L (ref 48–129)
BUN/Creatinine Ratio: 25 — ABNORMAL HIGH (ref 10–24)
BUN: 37 mg/dL — ABNORMAL HIGH (ref 8–27)
Bilirubin Total: 0.6 mg/dL (ref 0.0–1.2)
CO2: 22 mmol/L (ref 20–29)
Calcium: 10.4 mg/dL — ABNORMAL HIGH (ref 8.6–10.2)
Chloride: 107 mmol/L — ABNORMAL HIGH (ref 96–106)
Creatinine, Ser: 1.49 mg/dL — ABNORMAL HIGH (ref 0.76–1.27)
Globulin, Total: 2.1 g/dL (ref 1.5–4.5)
Glucose: 89 mg/dL (ref 70–99)
Potassium: 4.8 mmol/L (ref 3.5–5.2)
Sodium: 142 mmol/L (ref 134–144)
Total Protein: 6.8 g/dL (ref 6.0–8.5)
eGFR: 46 mL/min/1.73 — ABNORMAL LOW (ref >59)

## 2024-09-12 LAB — TSH: TSH: 1.95 u[IU]/mL (ref 0.450–4.500)

## 2024-09-14 ENCOUNTER — Ambulatory Visit (HOSPITAL_COMMUNITY): Payer: Self-pay | Admitting: Internal Medicine

## 2024-09-23 ENCOUNTER — Other Ambulatory Visit: Payer: Self-pay

## 2024-09-23 DIAGNOSIS — Z7901 Long term (current) use of anticoagulants: Secondary | ICD-10-CM | POA: Insufficient documentation

## 2024-09-24 ENCOUNTER — Encounter: Payer: Self-pay | Admitting: Cardiology

## 2024-09-24 ENCOUNTER — Telehealth (HOSPITAL_COMMUNITY): Payer: Self-pay | Admitting: *Deleted

## 2024-09-24 ENCOUNTER — Other Ambulatory Visit (HOSPITAL_COMMUNITY): Payer: Self-pay | Admitting: *Deleted

## 2024-09-24 ENCOUNTER — Ambulatory Visit: Admitting: Cardiology

## 2024-09-24 DIAGNOSIS — I4891 Unspecified atrial fibrillation: Secondary | ICD-10-CM

## 2024-09-24 MED ORDER — ATORVASTATIN CALCIUM 40 MG PO TABS: 40.0000 mg | ORAL_TABLET | Freq: Every day | ORAL | 3 refills | Status: AC

## 2024-09-24 NOTE — Telephone Encounter (Signed)
 Pt called requesting for routine cardiology care to replace Dr. Alveta to only be at [facility] in [place]. Order placed for this location.

## 2024-09-28 ENCOUNTER — Other Ambulatory Visit: Payer: Self-pay | Admitting: Cardiology

## 2024-10-08 DIAGNOSIS — D485 Neoplasm of uncertain behavior of skin: Secondary | ICD-10-CM | POA: Diagnosis not present

## 2024-10-20 DIAGNOSIS — C44519 Basal cell carcinoma of skin of other part of trunk: Secondary | ICD-10-CM | POA: Diagnosis not present

## 2024-10-22 ENCOUNTER — Ambulatory Visit
Attending: Student in an Organized Health Care Education/Training Program | Admitting: Student in an Organized Health Care Education/Training Program

## 2024-10-22 ENCOUNTER — Other Ambulatory Visit (HOSPITAL_COMMUNITY): Payer: Self-pay

## 2024-10-22 ENCOUNTER — Encounter: Payer: Self-pay | Admitting: Student in an Organized Health Care Education/Training Program

## 2024-10-22 VITALS — BP 181/73 | HR 73 | Ht 67.0 in | Wt 161.0 lb

## 2024-10-22 DIAGNOSIS — I10 Essential (primary) hypertension: Secondary | ICD-10-CM

## 2024-10-22 DIAGNOSIS — I2510 Atherosclerotic heart disease of native coronary artery without angina pectoris: Secondary | ICD-10-CM

## 2024-10-22 DIAGNOSIS — R7303 Prediabetes: Secondary | ICD-10-CM

## 2024-10-22 DIAGNOSIS — I4891 Unspecified atrial fibrillation: Secondary | ICD-10-CM | POA: Diagnosis not present

## 2024-10-22 DIAGNOSIS — I358 Other nonrheumatic aortic valve disorders: Secondary | ICD-10-CM

## 2024-10-22 MED ORDER — SPIRONOLACTONE 25 MG PO TABS: 12.5000 mg | ORAL_TABLET | Freq: Every day | ORAL | 3 refills | Status: DC

## 2024-10-22 MED ORDER — SPIRONOLACTONE 25 MG PO TABS
12.5000 mg | ORAL_TABLET | Freq: Every day | ORAL | 3 refills | Status: DC
  Filled 2024-10-22: qty 45, 90d supply, fill #0

## 2024-10-22 NOTE — Assessment & Plan Note (Signed)
 The patient was unaware of this diagnosis.  I counseled the patient on healthy eating and avoiding high sugar intake.  He will work on this.

## 2024-10-22 NOTE — Patient Instructions (Addendum)
 Medication Instructions:  START spironolactone 12.5 mg daily   Check blood pressure for 2 weeks and report results to provider   Blood Pressure Record Sheet To take your blood pressure, you will need a blood pressure machine. You can buy a blood pressure machine (blood pressure monitor) at your clinic, drug store, or online. When choosing one, consider: An automatic monitor that has an arm cuff. A cuff that wraps snugly around your upper arm. You should be able to fit only one finger between your arm and the cuff. A device that stores blood pressure reading results. Do not choose a monitor that measures your blood pressure from your wrist or finger. Follow your health care provider's instructions for how to take your blood pressure. To use this form: Take your blood pressure medications every day These measurements should be taken when you have been at rest for at least 10-15 min Take at least 2 readings with each blood pressure check. This makes sure the results are correct. Wait 1-2 minutes between measurements. Write down the results in the spaces on this form. Keep in mind it should always be recorded systolic over diastolic. Both numbers are important.  Repeat this every day for 2-3 weeks, or as told by your health care provider.  Make a follow-up appointment with your health care provider to discuss the results.  Blood Pressure Log Date Medications taken? (Y/N) Blood Pressure Time of Day                                                                                                         *If you need a refill on your cardiac medications before your next appointment, please call your pharmacy*  Lab Work: BMP in 1 week HGB A1C in 3 months   If you have labs (blood work) drawn today and your tests are completely normal, you will receive your results only by: MyChart Message (if you have MyChart) OR A paper copy in the mail If you have any lab test that is  abnormal or we need to change your treatment, we will call you to review the results.   Follow-Up: At [facility], you and your health needs are our priority.  As part of our continuing mission to provide you with exceptional heart care, our providers are all part of one team.  This team includes your primary Cardiologist (physician) and Advanced Practice Providers or APPs (Physician Assistants and Nurse Practitioners) who all work together to provide you with the care you need, when you need it.  Your next appointment:   1 month(s)  Provider:   One of our Advanced Practice Providers (APPs): Morse Clause, PA-C  Lamarr Satterfield, NP Miriam Shams, NP  Olivia Pavy, PA-C Josefa Beauvais, NP  Leontine Salen, PA-C Orren Fabry, PA-C  Hao Meng, PA-C Ernest Dick, NP  Damien Braver, NP Jon Hails, PA-C  Waddell Donath, PA-C    Dayna Dunn, PA-C  Scott Weaver, PA-C Lum Louis, NP Katlyn West, NP Callie Goodrich, PA-C  Xika Zhao, NP Sheng Haley, PA-C  Rollo Louder, PA-C   Then, Georganna Archer, MD will plan to see you again in 6 month(s).

## 2024-10-22 NOTE — Assessment & Plan Note (Signed)
 His blood pressure is not at goal of <130/80.  Currently taking chlorthalidone , amlodipine , and valsartan  all at max dosage.  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 -Start spironolactone 12.5 mg daily -Continue chlorthalidone  25 mg daily -Continue valsartan  320 mg daily -Continue amlodipine  10 mg daily - BMP in 1 week to assess potassium - Continue potassium supplementation for now but may need to discontinue in the setting of spironolactone addition - Follow-up in 1 month with APP for HTN

## 2024-10-22 NOTE — Assessment & Plan Note (Signed)
 No chest pain or symptoms.  No changes to regiment.

## 2024-10-22 NOTE — Progress Notes (Signed)
 Cardiology Office Note:   Date:  10/22/2024  ID:  Xavier, Bell [birth year], MRN [id number] PCP: Patient, No Pcp Per  [facility] Providers Cardiologist:  Georganna Archer, MD Electrophysiologist:  Soyla Gladis Norton, MD { Chief Complaint:  Chief Complaint  Patient presents with   Hypertension      History of Present Illness:   Xavier Bell is a 83 y.o. male with a PMH of CAD s/p CABG, atrial flutter s/p ablation (2015), PAF (on Eliquis ), prior CVA, HTN, HLD, carotid stenosis and prediabetes who presents for follow up.  Patient was previously seen by Dr. Alveta.  Last seen in clinic by EP on 05/12/2024 was found to have a high burden of PAF.  He was started on amiodarone  for rhythm control.  Today the patient general follow-up.  The patient has no complaints and denies chest pain, SOB, PND, orthopnea, palpitations, swelling, syncope and presyncope.  He is taking all his medication as prescribed.  The patient checks his blood pressure at home and share his BP log with me.  His blood pressures on average are in the 140s systolic.  His blood pressure today is markedly elevated in the 180s.  Asymptomatic.    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/2018   History of retinal detachment 11/12/2011   HTN (hypertension) 12/31/2011   Hx of detached retina repair 07/22/2023   Hypercoagulable state due to paroxysmal atrial fibrillation (HCC) 04/15/2024   Hypertension    MI (myocardial infarction) (HCC) 1995   Mixed  hyperlipidemia 12/31/2016   Pain of lumbar spine 02/13/2018   Paronychia of great toe, left 07/03/2021   Paroxysmal atrial fibrillation (HCC)    Prediabetes 01/01/2022   Primary open angle glaucoma of left eye, moderate stage 09/30/2013   Pseudophakia    IMO SNOMED Dx Update Oct 2024     Retinal detachment    Second degree AV block, Mobitz type II 03/29/2024   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   Vitamin D  deficiency 12/31/2011   IMO SNOMED Dx Update Oct 2024       Studies Reviewed:    EKG:  EKG Interpretation Date/Time:  Thursday October 22 2024 13:50:11 EDT Ventricular Rate:  73 PR Interval:  292 QRS Duration:  124 QT Interval:  410 QTC Calculation: 451 R Axis:   101  Text Interpretation: Sinus rhythm with 1st degree A-V block Rightward axis Left ventricular hypertrophy with QRS widening and repolarization abnormality ( Cornell product ) When compared with ECG of 11-Sep-2024 09:46, Confirmed by Archer Georganna [phone]) on 10/22/2024 2:38:28 PM     Cardiac Studies & Procedures   ______________________________________________________________________________________________     ECHOCARDIOGRAM  ECHOCARDIOGRAM COMPLETE 03/30/2024  Narrative ECHOCARDIOGRAM REPORT    Patient Name:   Xavier Bell Date of Exam: 03/30/2024 Medical Rec #:  [id number]          Height:       67.0 in Accession #:    [id number]         Weight:  156.1 lb Date of Birth:  1941-05-11           BSA:          1.820 m Patient Age:    83 years           BP:           136/54 mmHg Patient Gender: M                  HR:           43 bpm. Exam Location:  Inpatient  Procedure: 2D Echo, Color Doppler and Cardiac Doppler (Both Spectral and Color Flow Doppler were utilized during procedure).  Indications:    Stroke  History:        Patient has prior history of Echocardiogram examinations, most recent 11/23/2011.  Sonographer:    Eva Lash Referring Phys: [phone] DENISE A  WOLFE  IMPRESSIONS   1. Left ventricular ejection fraction, by estimation, is 50%. The left ventricle has low normal function. The left ventricle has no regional wall motion abnormalities. Left ventricular diastolic parameters are indeterminate. 2. Right ventricular systolic function is mildly reduced. The right ventricular size is mildly enlarged. Tricuspid regurgitation signal is inadequate for assessing PA pressure. 3. Left atrial size was mildly dilated. 4. The mitral valve is degenerative. Trivial mitral valve regurgitation. No evidence of mitral stenosis. 5. The aortic valve is grossly normal. There is mild calcification of the aortic valve. Aortic valve regurgitation is trivial. Aortic valve sclerosis/calcification is present, without any evidence of aortic stenosis. 6. The inferior vena cava is normal in size with greater than 50% respiratory variability, suggesting right atrial pressure of 3 mmHg.  FINDINGS Left Ventricle: Left ventricular ejection fraction, by estimation, is 50%. The left ventricle has low normal function. The left ventricle has no regional wall motion abnormalities. Definity  contrast agent was given IV to delineate the left ventricular endocardial borders. The left ventricular internal cavity size was normal in size. There is no left ventricular hypertrophy. Abnormal (paradoxical) septal motion consistent with post-operative status. Left ventricular diastolic parameters are indeterminate.  Right Ventricle: The right ventricular size is mildly enlarged. No increase in right ventricular wall thickness. Right ventricular systolic function is mildly reduced. Tricuspid regurgitation signal is inadequate for assessing PA pressure.  Left Atrium: Left atrial size was mildly dilated.  Right Atrium: Right atrial size was normal in size.  Pericardium: There is no evidence of pericardial effusion.  Mitral Valve: The mitral valve is degenerative in appearance. Trivial mitral  valve regurgitation. No evidence of mitral valve stenosis.  Tricuspid Valve: The tricuspid valve is normal in structure. Tricuspid valve regurgitation is trivial. No evidence of tricuspid stenosis.  Aortic Valve: The aortic valve is grossly normal. There is mild calcification of the aortic valve. Aortic valve regurgitation is trivial. Aortic valve sclerosis/calcification is present, without any evidence of aortic stenosis. Aortic valve mean gradient measures 6.8 mmHg. Aortic valve peak gradient measures 13.8 mmHg. Aortic valve area, by VTI measures 0.89 cm.  Pulmonic Valve: The pulmonic valve was not well visualized. Pulmonic valve regurgitation is trivial. No evidence of pulmonic stenosis.  Aorta: The aortic root is normal in size and structure.  Venous: The inferior vena cava is normal in size with greater than 50% respiratory variability, suggesting right atrial pressure of 3 mmHg.  IAS/Shunts: The interatrial septum was not well visualized.   LEFT VENTRICLE PLAX 2D LVIDd:         4.20 cm  Diastology LVIDs:         3.80 cm      LV e' medial:    5.00 cm/s LV PW:         1.30 cm      LV E/e' medial:  24.2 LV IVS:        1.30 cm      LV e' lateral:   14.30 cm/s LVOT diam:     1.70 cm      LV E/e' lateral: 8.5 LV SV:         35 LV SV Index:   19 LVOT Area:     2.27 cm  LV Volumes (MOD) LV vol d, MOD A2C: 182.0 ml LV vol d, MOD A4C: 135.0 ml LV vol s, MOD A2C: 90.5 ml LV vol s, MOD A4C: 73.4 ml LV SV MOD A2C:     91.5 ml LV SV MOD A4C:     135.0 ml LV SV MOD BP:      73.9 ml  RIGHT VENTRICLE RV S prime:     9.46 cm/s TAPSE (M-mode): 1.2 cm  LEFT ATRIUM             Index LA diam:        4.60 cm 2.53 cm/m LA Vol (A2C):   62.9 ml 34.56 ml/m LA Vol (A4C):   68.4 ml 37.58 ml/m LA Biplane Vol: 66.6 ml 36.59 ml/m AORTIC VALVE AV Area (Vmax):    1.02 cm AV Area (Vmean):   0.88 cm AV Area (VTI):     0.89 cm AV Vmax:           185.84 cm/s AV Vmean:          113.522  cm/s AV VTI:            0.400 m AV Peak Grad:      13.8 mmHg AV Mean Grad:      6.8 mmHg LVOT Vmax:         83.50 cm/s LVOT Vmean:        44.100 cm/s LVOT VTI:          0.156 m LVOT/AV VTI ratio: 0.39  AORTA Ao Asc diam: 3.40 cm  MV E velocity: 121.00 cm/s SHUNTS Systemic VTI:  0.16 m Systemic Diam: 1.70 cm  Soyla Merck MD Electronically signed by Soyla Merck MD Signature Date/Time: 03/30/2024/12:42:44 PM    Final    MONITORS  LONG TERM MONITOR (3-14 DAYS) 05/07/2024  Narrative Patch Wear Time:  13 days and 17 hours  HR 21 - 154, average 51 bpm. Atrial fibrillation detected. 74% atrial fibrillation burden, longest 6 days 16 hours with average heart rate of 50 bpm Rare supraventricular ectopy. Rare ventricular ectopy. 1155 pauses, longest 5.1 seconds, all between 10 PM and 3 AM 1 episode complete heart block lasting 12 seconds at 11 PM No symptom trigger episodes.   [facility] Cardiac Electrophysiology       ______________________________________________________________________________________________      Risk Assessment/Calculations:           Physical Exam:     VS:  BP (!) 181/73 (BP Location: Left Arm, Patient Position: Sitting, Cuff Size: Normal)   Pulse 73   Ht 5' 7 (1.702 m)   Wt 161 lb (73 kg)   SpO2 95%   BMI 25.22 kg/m      Wt Readings from Last 3 Encounters:  09/11/24 157 lb 3.2 oz (71.3 kg)  06/12/24 156 lb 9.6  oz (71 kg)  05/12/24 156 lb (70.8 kg)     GEN: Well nourished, well developed, in no acute distress NECK: No JVD; No carotid bruits CARDIAC: RRR, II/VI systolic murmur at RUSB, no rubs or gallops RESPIRATORY:  Clear to auscultation without rales, wheezing or rhonchi  ABDOMEN: Soft, non-tender, non-distended, normal bowel sounds EXTREMITIES:  Warm and well perfused, no edema; No deformity, 2+ radial pulses PSYCH: Normal mood and affect   Assessment & Plan Atrial fibrillation, unspecified type  (HCC) Currently NSR.  Tolerating amiodarone  well. -Follow-up with EP as scheduled -Continue amiodarone  -Continue Eliquis  - Follow-up in 6 months  Primary hypertension His blood pressure is not at goal of <130/80.  Currently taking chlorthalidone , amlodipine , and valsartan  all at max dosage.  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 -Start spironolactone 12.5 mg daily -Continue chlorthalidone  25 mg daily -Continue valsartan  320 mg daily -Continue amlodipine  10 mg daily - BMP in 1 week to assess potassium - Continue potassium supplementation for now but may need to discontinue in the setting of spironolactone addition - Follow-up in 1 month with APP for HTN  Coronary artery disease involving native coronary artery of native heart without angina pectoris No chest pain or symptoms.  No changes to regiment.  Prediabetes The patient was unaware of this diagnosis.  I counseled the patient on healthy eating and avoiding high sugar intake.  He will work on this. Aortic valve sclerosis He has a murmur on exam which sounds like AAS, but he had a recent echo which showed aortic sclerosis without stenosis.  I will likely repeat an echocardiogram in a couple years to ensure that stenosis has not developed. -Repeat echocardiogram in 2-3 years          This note was written with the assistance of a dictation microphone or AI dictation software. Please excuse any typos or grammatical errors.   Signed, Georganna Archer, MD 10/22/2024 1:06 PM    [facility]

## 2024-10-30 DIAGNOSIS — H401122 Primary open-angle glaucoma, left eye, moderate stage: Secondary | ICD-10-CM | POA: Diagnosis not present

## 2024-10-30 DIAGNOSIS — I10 Essential (primary) hypertension: Secondary | ICD-10-CM | POA: Diagnosis not present

## 2024-10-30 DIAGNOSIS — H401111 Primary open-angle glaucoma, right eye, mild stage: Secondary | ICD-10-CM | POA: Diagnosis not present

## 2024-10-30 DIAGNOSIS — I2510 Atherosclerotic heart disease of native coronary artery without angina pectoris: Secondary | ICD-10-CM | POA: Diagnosis not present

## 2024-10-30 LAB — BASIC METABOLIC PANEL WITH GFR
BUN/Creatinine Ratio: 26 — ABNORMAL HIGH (ref 10–24)
BUN: 44 mg/dL — ABNORMAL HIGH (ref 8–27)
CO2: 21 mmol/L (ref 20–29)
Calcium: 10 mg/dL (ref 8.6–10.2)
Chloride: 103 mmol/L (ref 96–106)
Creatinine, Ser: 1.69 mg/dL — ABNORMAL HIGH (ref 0.76–1.27)
Glucose: 107 mg/dL — ABNORMAL HIGH (ref 70–99)
Potassium: 4.4 mmol/L (ref 3.5–5.2)
Sodium: 139 mmol/L (ref 134–144)
eGFR: 39 mL/min/1.73 — ABNORMAL LOW (ref >59)

## 2024-10-30 NOTE — Progress Notes (Signed)
 Ophthalmology Department Clinical Visit Note     CHIEF COMPLAINT Patient presents for Glaucoma   HISTORY OF PRESENT ILLNESS: Xavier Bell. is a 83 y.o. old male who presents to the clinic today for:   HPI     Glaucoma   The patient is here for a return visit.  The patient is currently taking glaucoma medications.  The patient always complies with treatment.  The patient reports they are tolerating drops well.  The patient does not need an eye medication refill.        Comments    Primary open angle glaucoma (POAG) of left eye, moderate stage Vision seems to be a little worse since last visit. Has an appointment to see Dr. Scarlet in January.       Last edited by Stephane JONELLE Kerns, COA on 10/30/2024 12:39 PM.      YAG cap OS 03/08/2021 - MG POAG OU, moderate OS, mild OD. H/o RD OS   Thick corneas (610 OD, 595 OS)  Humphrey visual field interpretation  04/13/2024: 30-2 Good reliability OU OD: Normal-VFI 97; MD -4.2; GPA: -0.2; stable OS: Inferior nasal step and arcuate defect -VFI 65; MD -14.6; fovea: 27; GPA: -1.5; slightly worse  Humphrey visual field interpretation  04/01/2023: Good reliability OU OD: Mild superior suppression - VFI 96; MD -2.1; GPA -0.2; stable OS: Inferior nasal step and arcuate defect - VFI 75; MD -10.9; GPA -1.4; stable  Humphrey visual field interpretation  06/01/2022: Good reliability OU OD: Non-specific focal defects - VFI 96; MD -2.1; GPA -0.2; stable OS: Inferior nasal step and arcuate defect - VFI 76; MD -12.5; GPA -1.2; stable  Humphrey visual field interpretation  04/18/2021: Good reliability OU OD: Probably normal (peripheral rim artifact) - VFI 94; MD -5.3; GPA -0.2; probably stable OS: Inferior nasal step and arcuate defect - VFI 78; MD -10.1; GPA -1.2; stable last 5 fields  Humphrey visual field interpretation  03/30/2020: Good reliability OU OD: Probably normal (superior peripheral defect probably artifact) - VFI 96; MD  -2.1 OS: Inferior nasal step and arcuate defect - VFI 80; MD -8.9  Humphrey visual field interpretation  03/02/2019: Good reliability OU OD: Normal - VFI 99; MD -1.7; GPA 0; stable OS: Inferior nasal step and arcuate defect - VFI 79; MD -9.4; GPA -1.2; maybe worse  Humphrey visual field interpretation  03/28/2018: Good reliability OU OD: Normal - VFI 99; MD -2.7 OS: Inferior nasal step and arcuate defect - VFI 84; MD -10.1; GPA -1.1  Humphrey visual field interpretation 01/27/2016: Good reliability OU OD: Within normal limits - VFI 99; MD-1.2; GPA 0; stable OS: Inferior nasal defect - VFI 87; MD-6.5; GPA -0.9; stable  Humphrey visual field interpretation  08/15/2015: Good reliability OU OD:  Within normal limits - VFI 99; MD-1.0; GPA 0; stable OS:  Inferior nasal step and arcuate defect - VFI 88; MD-6.6; GPA-0.9; stable back through 2013   Humphrey visual field interpretation  10/04/2014: Good reliability OU OD: Normal-- MD -1.9; stable OS: Inferonasal defect-- MD -7.0; improved compared to 2014     Humphrey visual field interpretation 09/28/2013:   Good reliability OU   OD: Within normal limits; stable   OS: Inferior arcuate defect, Inferior nasal step; probably worse than prior   Humphrey visual field interpretation 10/27/2012:   Good reliability OU   OD: Within normal limits   OS: Inferior nasal step - probably stable compared to prior  OCT RNFL -  04/13/2024 Reliability:  Good, QI  30 OD, QI  30 OS OD:  Normal double-hump pattern on profile view; sup avg 72, inf avg 99 OS:  Thin superiorly on profile view; sup avg 43, inf avg 92  OCT RNFL -  04/01/2023: Reliability:  Good, QI  28 OD, QI  27 OS OD: Blunted double hump on profile view; sup avg 70, inf avg 104 OS: Diffusely thin on profile view; sup avg 55, inf avg 67  OCT RNFL -  06/01/2022: Reliability: Unusable due to blink artifacts, SS  6/10 OD, 3/10 OS  OCT RNFL -  06/14/2021: Reliability:  Good, SS  4/10 OD,  6/10  OS OD:  Thin superiorly on profile view; sup avg 65, inf avg 82 OS:  Thin superiorly on profile view; sup avg 54, inf avg 87   CURRENT MEDICATIONS: No current outpatient medications on file prior to visit. (Ophthalmic Drugs)    Current Outpatient Medications on File Prior to Visit (Other)  Medication Sig  . amLODIPine  (NORVASC ) 10 mg tablet   . ascorbic acid (VITAMIN C) 1,000 mg tablet Take 1,000 mg by mouth Once Daily.  . aspirin  81 mg EC tablet 81 mg.  . atorvastatin  (LIPITOR) 40 mg tablet Take  by mouth.  . benazepriL-hydroCHLOROthiazide  (LOTENSIN HCT) 10-12.5 mg tablet Take 1 tablet by mouth Once Daily.  . carvediloL  (COREG ) 25 mg tablet   . chlorthalidone  (HYGROTON ) 25 mg tablet   . cholecalciferol (VITAMIN D3) 2,000 unit tablet 2,000 Units.  . fenofibrate  nanocrystallized (TRICOR ) 145 mg tablet   . hydroCHLOROthiazide  (HYDRODIURIL ) 25 mg tablet   . L. acidophilus-L. rhamnosus (Florajen Women) 15 billion cell cap Take  by mouth.  . losartan  (COZAAR ) 100 mg tablet   . multivitamin (THERAGRAN) tab tablet Take 1 tablet by mouth Once Daily.  . potassium chloride  (Klor-Con  M20) 20 mEq ER tablet   . valsartan  (DIOVAN ) 320 mg tablet   . zinc acetate (Galzin) 50 mg (zinc) cap Take 50 mg by mouth Once Daily.    Referring physician: No referring provider defined for this encounter.  ALLERGIES Allergies  Allergen Reactions  . Brimonidine  Other (See Comments)  . Niacin  Other (See Comments)    Face turns red  Stomach Pain and flushing    PAST MEDICAL HISTORY Past Medical History:  Diagnosis Date  . Arthritis   . Cataract   . Glaucoma, unspecified glaucoma type, unspecified laterality    Med Hx: Glaucoma;   . Hypertension   . Retinal detachment   . Stroke    (CMD) 03/28/2024   Past Surgical History:  Procedure Laterality Date  . CARDIAC SURGERY     Procedure: CARDIAC SURGERY  . CATARACT EXTRACTION     Procedure: CATARACT EXTRACTION  . RETINAL DETACHMENT SURGERY      Procedure: RETINAL DETACHMENT SURGERY  . RETINAL DETACHMENT SURGERY     Procedure: RETINAL DETACHMENT SURGERY    FAMILY HISTORY Family History  Problem Relation Name Age of Onset  . Stroke Mother    . Hypertension Father    . Glaucoma Neg Hx    . Macular degeneration Neg Hx    . Retinal detachment Neg Hx    . Strabismus Neg Hx    . Amblyopia Neg Hx    . Blindness Neg Hx      SOCIAL HISTORY Social History   Tobacco Use  . Smoking status: Never    Passive exposure: Past  . Smokeless tobacco: Never  Vaping Use  . Vaping status: Never Used  . Passive vaping  exposure: Yes  Substance Use Topics  . Alcohol use: Yes    Alcohol/week: 28.3 standard drinks of alcohol  . Drug use: No        OPHTHALMIC EXAM:  Base Eye Exam     Visual Acuity (Snellen - Linear)       Right Left   Dist cc 20/25 +2 20/25 +2    Correction: Glasses         Tonometry (Applanation, 1:02 PM)       Right Left   Pressure 19 18         Neuro/Psych     Oriented x3: Yes   Mood/Affect: Normal           Slit Lamp and Fundus Exam     External Exam       Right Left   External Normal Normal         Slit Lamp Exam       Right Left   Lids/Lashes Dermatochalasis Dermatochalasis   Conjunctiva/Sclera Scar, 11f, 2v Scar, 80f, 2v   Cornea Clear Clear   Anterior Chamber Deep and quiet Deep and quiet   Iris Round and reactive Round and reactive   Lens PC IOL PC IOL         Fundus Exam       Right Left   Disc PPA, rim intact, 1+ pallor, no heme PPA, notch at 12, 1+ pallor, no heme   C/D Ratio 0.7 0.8            IMAGING AND PROCEDURES:  @EYRES @     ASSESSMENT: 1. Primary open angle glaucoma (POAG) of left eye, moderate stage  Humphrey Visual Field - OU - Both Eyes   OCT RNFL - OU - Both Eyes    2. Primary open angle glaucoma (POAG) of right eye, mild stage  Humphrey Visual Field - OU - Both Eyes   OCT RNFL - OU - Both Eyes     Patient is tolerating and taking  medication as prescribed. IOP good OU. Continue drops the same.  Recheck in 4 months HVF 30-2, OCT/RNFL   PLAN:  Brimonidine  x2 OU  Dorzolamide -Timolol  x2 OU Latanoprost  x1 OU Recommend PF AT's prn Reviewed proper drop instillation technique  Return in about 4 months (around 02/27/2025) for HVF 30-2, OCT/RNFL.  Patient Instructions  DAILY DRUG REMINDER  Wait 10 minutes between drops!!! (Keep eyes closed for 2 MINUTES after each drop)   Drug EYE R = Right L = Left B = Both Breakfast Lunch Supper Bedtime Time Frame  Brimonidine  *Alphagan  P* (Purple top)  B X  X  Take about 12 hours apart.  Dorzolamide -Timolol  *Cosopt * (Navy blue top)  B X  X  Take about 12 hours apart.    Latanoprost  (Teal green top)  B    X           HOW TO TAKE EYEDROPS 1.   Make sure you understand when, and exactly how often, to take your eyedrop medicine.  Preferably, learn the names of your medicines, or at least be able to identify your eyedrops by the color of the cap of the bottle (e.g., "I take the purple top 2 times a day in the right eye and the yellow top 1 time a day in both eyes."). 2.  Always wash your hands prior to instilling your eyedrops. 3.  If you are putting drops in both eyes, then sit down or lie down to put in your drops.  This is because it is important to close the eye getting drops for 2 minutes immediately after instilling the drop (this markedly improves absorption).  If you are putting the drops in both eyes, then you will be closing both eyes for at least 2 minutes, therefore you should be sitting or lying down.  If you must stand to put in the drops (need to look in mirror), then have a chair next to you so that you can sit just after instilling the drops and close your eyes for 2 minutes.  If you are only instilling an eyedrop in one eye, then you only have to close that eye after the drop, and therefore you don't necessarily have to sit or lie. 4.  If you are taking more than  one drop at a time in the same eye, then you must separate the drops by at least 10 minutes. 5.  To put in the drop, hold the bottle in your dominant hand between your forefinger and thumb and invert the bottle over the targeted eye.  Your head should be tilted back and you should roll your eyes upward. 6.  With your other hand, pull the lower eyelid down and try to instill the drop in the "pouch" made by pulling the lid down, or on the lower part of the eye ball.  Position the bottle close to the eye, but do not touch the eye with the dropper tip.  Squeeze the sides of the bottle and a drop of the medicine will come out. 7.  You will feel the drop when it contacts the eye, and then you should immediately close the eye, or if you are putting the drop in both eyes, move your hands immediately to the other eye, and place the drop in it, and then close both eyes as recommended in #2 above. 8.  While the eye/s are closed, use a tissue to wipe away any medication that remains on the lid or lashes.  You may do this during the 2 minutes that the eye remains closed, just do not open the eye to wipe it. 9.  Always remember to put the cap back on the bottle and store the bottle away from pets and not in direct sunlight.  The bottle does not have to be refrigerated, but it may be if that is preferred (some people prefer feeling the cooled eyedrop on the eye). 10.  Never stop the drops without conferring with your doctor first, even if you feel that the drops are not helping your eye.  If you believe you are experiencing side-effects from the eyedrops, then discuss that, or any issues or concerns,  with your doctor.     This document serves as a record of services personally performed by J. Thresa Bracket. It was created on their behalf by My Rayleen, a trained medical scribe. The creation of this record is the provider's dictation and/or activities during the visit. I agree the documentation is accurate and  complete.  Electronically signed by: JINNY Thresa Bracket, MD 11/01/2024 3:22 PM

## 2024-11-02 ENCOUNTER — Ambulatory Visit: Payer: Self-pay | Admitting: Student in an Organized Health Care Education/Training Program

## 2024-11-02 ENCOUNTER — Telehealth: Payer: Self-pay | Admitting: Student in an Organized Health Care Education/Training Program

## 2024-11-02 DIAGNOSIS — R7989 Other specified abnormal findings of blood chemistry: Secondary | ICD-10-CM

## 2024-11-02 MED ORDER — VALSARTAN 320 MG PO TABS: 320.0000 mg | ORAL_TABLET | Freq: Every day | ORAL | 3 refills | Status: AC

## 2024-11-02 NOTE — Telephone Encounter (Signed)
 Pt's medication was sent to pt's pharmacy as requested. Confirmation received.

## 2024-11-02 NOTE — Telephone Encounter (Signed)
*  STAT* If patient is at the pharmacy, call can be transferred to refill team.   1. Which medications need to be refilled? (please list name of each medication and dose if known)   valsartan  (DIOVAN ) 320 MG tablet   2. Would you like to learn more about the convenience, safety, & potential cost savings by using the [facility]?   3. Are you open to using the [facility] (Type [facility]. ).  4. Which pharmacy/location (including street and city if local pharmacy) is medication to be sent to?  [facility] - [place], [place] - [address]   5. Do they need a 30 day or 90 day supply?   90 day  Patient stated he still has some medication left.

## 2024-11-04 DIAGNOSIS — C44229 Squamous cell carcinoma of skin of left ear and external auricular canal: Secondary | ICD-10-CM | POA: Diagnosis not present

## 2024-11-04 NOTE — Telephone Encounter (Signed)
 Patient returned RN's call.

## 2024-11-04 NOTE — Progress Notes (Signed)
 Attempted to contact patient, but unable to leave message. Will try again at later time.

## 2024-11-12 LAB — HEMOGLOBIN A1C
Est. average glucose Bld gHb Est-mCnc: 114 mg/dL
Hgb A1c MFr Bld: 5.6 % (ref 4.8–5.6)

## 2024-11-22 NOTE — Progress Notes (Incomplete)
 Cardiology Office Note:   Date:  11/23/2024  ID:  Xavier, Bell 1941/04/05, MRN [id number] PCP: Patient, No Pcp Per  [facility] Providers Cardiologist:  Xavier Archer, MD Electrophysiologist:  Xavier Gladis Norton, MD { Chief Complaint:  Chief Complaint  Patient presents with   Hypertension      History of Present Illness:   Xavier Bell is a 83 y.o. male with a PMH of CAD s/p CABG, atrial flutter s/p ablation (2015), PAF (on Eliquis ), prior CVA, HTN, HLD, carotid stenosis and prediabetes who presents for follow up.  Patient was previously seen by Dr. Alveta.  Interval History 11/23/2024 : - Patient presents today for follow-up of HTN. - I started the patient on spironolactone  last visit. - He has been taking all of his medications as prescribed and has been tolerating the spironolactone  well. - He provided an extremely detailed blood pressure log during today's visit and is SBPs have ranged 129-150 systolic and spironolactone . - No new symptoms or concerns     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/2018   History of retinal detachment 11/12/2011   HTN (hypertension) 12/31/2011   Hx of detached retina repair 07/22/2023   Hypercoagulable state due to paroxysmal atrial fibrillation (HCC) 04/15/2024   Hypertension    MI (myocardial infarction) (HCC) 1995   Mixed hyperlipidemia 12/31/2016   Pain of lumbar spine 02/13/2018   Paronychia of great toe, left 07/03/2021   Paroxysmal atrial fibrillation  (HCC)    Prediabetes 01/01/2022   Primary open angle glaucoma of left eye, moderate stage 09/30/2013   Pseudophakia    IMO SNOMED Dx Update Oct 2024     Retinal detachment    Second degree AV block, Mobitz type II 03/29/2024   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   Vitamin D  deficiency 12/31/2011   IMO SNOMED Dx Update Oct 2024       Studies Reviewed:    EKG: No new ECG       Cardiac Studies & Procedures   ______________________________________________________________________________________________     ECHOCARDIOGRAM  ECHOCARDIOGRAM COMPLETE 03/30/2024  Narrative ECHOCARDIOGRAM REPORT    Patient Name:   [facility] Date of Exam: 03/30/2024 Medical Rec #:  [id number]          Height:       67.0 in Accession #:    [id number]         Weight:       156.1 lb Date of Birth:  Jan 17, 1941           BSA:          1.820 m Patient Age:    83 years           BP:           136/54 mmHg Patient Gender: M                  HR:           43 bpm. Exam Location:  Inpatient  Procedure: 2D Echo, Color Doppler and Cardiac Doppler (Both Spectral and Color Flow Doppler were utilized during procedure).  Indications:    Stroke  History:        Patient has prior history of Echocardiogram examinations, most recent 11/23/2011.  Sonographer:    Eva Lash Referring Phys: [phone] Xavier Bell  IMPRESSIONS   1. Left ventricular ejection fraction, by estimation, is 50%. The left ventricle has low normal function. The left ventricle has no regional wall motion abnormalities. Left ventricular diastolic parameters are indeterminate. 2. Right ventricular systolic function is mildly reduced. The right ventricular size is mildly enlarged. Tricuspid regurgitation signal is inadequate for assessing PA pressure. 3. Left atrial size was mildly dilated. 4. The mitral valve is degenerative. Trivial mitral valve regurgitation. No evidence of mitral stenosis. 5. The aortic valve is grossly normal. There is  mild calcification of the aortic valve. Aortic valve regurgitation is trivial. Aortic valve sclerosis/calcification is present, without any evidence of aortic stenosis. 6. The inferior vena cava is normal in size with greater than 50% respiratory variability, suggesting right atrial pressure of 3 mmHg.  FINDINGS Left Ventricle: Left ventricular ejection fraction, by estimation, is 50%. The left ventricle has low normal function. The left ventricle has no regional wall motion abnormalities. Definity  contrast agent was given IV to delineate the left ventricular endocardial borders. The left ventricular internal cavity size was normal in size. There is no left ventricular hypertrophy. Abnormal (paradoxical) septal motion consistent with post-operative status. Left ventricular diastolic parameters are indeterminate.  Right Ventricle: The right ventricular size is mildly enlarged. No increase in right ventricular wall thickness. Right ventricular systolic function is mildly reduced. Tricuspid regurgitation signal is inadequate for assessing PA pressure.  Left Atrium: Left atrial size was mildly dilated.  Right Atrium: Right atrial size was normal in size.  Pericardium: There is no evidence of pericardial effusion.  Mitral Valve: The mitral valve is degenerative in appearance. Trivial mitral valve regurgitation. No evidence of mitral valve stenosis.  Tricuspid Valve: The tricuspid valve is normal in structure. Tricuspid valve regurgitation is trivial. No evidence of tricuspid stenosis.  Aortic Valve: The aortic valve is grossly normal. There is mild calcification of the aortic valve. Aortic valve regurgitation is trivial. Aortic valve sclerosis/calcification is present, without any evidence of aortic stenosis. Aortic valve mean gradient measures 6.8 mmHg. Aortic valve peak gradient measures 13.8 mmHg. Aortic valve area, by VTI measures 0.89 cm.  Pulmonic Valve: The pulmonic valve was not well  visualized. Pulmonic valve regurgitation is trivial. No evidence of pulmonic stenosis.  Aorta: The aortic root is normal in size and structure.  Venous: The inferior vena cava is normal in size with greater than 50% respiratory variability, suggesting right atrial pressure of 3 mmHg.  IAS/Shunts: The interatrial septum was not well visualized.   LEFT VENTRICLE PLAX 2D LVIDd:         4.20 cm      Diastology LVIDs:         3.80 cm      LV e' medial:    5.00 cm/s LV PW:         1.30 cm      LV E/e' medial:  24.2 LV IVS:        1.30 cm      LV e' lateral:   14.30 cm/s LVOT diam:     1.70 cm      LV E/e' lateral: 8.5 LV SV:  35 LV SV Index:   19 LVOT Area:     2.27 cm  LV Volumes (MOD) LV vol d, MOD A2C: 182.0 ml LV vol d, MOD A4C: 135.0 ml LV vol s, MOD A2C: 90.5 ml LV vol s, MOD A4C: 73.4 ml LV SV MOD A2C:     91.5 ml LV SV MOD A4C:     135.0 ml LV SV MOD BP:      73.9 ml  RIGHT VENTRICLE RV S prime:     9.46 cm/s TAPSE (M-mode): 1.2 cm  LEFT ATRIUM             Index LA diam:        4.60 cm 2.53 cm/m LA Vol (A2C):   62.9 ml 34.56 ml/m LA Vol (A4C):   68.4 ml 37.58 ml/m LA Biplane Vol: 66.6 ml 36.59 ml/m AORTIC VALVE AV Area (Vmax):    1.02 cm AV Area (Vmean):   0.88 cm AV Area (VTI):     0.89 cm AV Vmax:           185.84 cm/s AV Vmean:          113.522 cm/s AV VTI:            0.400 m AV Peak Grad:      13.8 mmHg AV Mean Grad:      6.8 mmHg LVOT Vmax:         83.50 cm/s LVOT Vmean:        44.100 cm/s LVOT VTI:          0.156 m LVOT/AV VTI ratio: 0.39  AORTA Ao Asc diam: 3.40 cm  MV E velocity: 121.00 cm/s SHUNTS Systemic VTI:  0.16 m Systemic Diam: 1.70 cm  Xavier Merck MD Electronically signed by Xavier Merck MD Signature Date/Time: 03/30/2024/12:42:44 PM    Final    MONITORS  LONG TERM MONITOR (3-14 DAYS) 05/07/2024  Narrative Patch Wear Time:  13 days and 17 hours  HR 21 - 154, average 51 bpm. Atrial fibrillation  detected. 74% atrial fibrillation burden, longest 6 days 16 hours with average heart rate of 50 bpm Rare supraventricular ectopy. Rare ventricular ectopy. 1155 pauses, longest 5.1 seconds, all between 10 PM and 3 AM 1 episode complete heart block lasting 12 seconds at 11 PM No symptom trigger episodes.   [facility] Cardiac Electrophysiology       ______________________________________________________________________________________________      Risk Assessment/Calculations:           Physical Exam:     VS:  BP (!) 178/62 (BP Location: Left Arm, Patient Position: Sitting, Cuff Size: Normal)   Pulse 71   Ht 5' 7 (1.702 m)   Wt 158 lb (71.7 kg)   SpO2 98%   BMI 24.75 kg/m      Wt Readings from Last 3 Encounters:  10/22/24 161 lb (73 kg)  09/11/24 157 lb 3.2 oz (71.3 kg)  06/12/24 156 lb 9.6 oz (71 kg)     GEN: Well nourished, well developed, in no acute distress NECK: No JVD; No carotid bruits CARDIAC: RRR, II/VI systolic murmur at RUSB, no rubs or gallops RESPIRATORY:  Clear to auscultation without rales, wheezing or rhonchi  ABDOMEN: Soft, non-tender, non-distended, normal bowel sounds EXTREMITIES:  Warm and well perfused, no edema; No deformity, 2+ radial pulses PSYCH: Normal mood and affect   Assessment & Plan Primary hypertension - Presents today for follow-up of HTN. -His blood pressure is really elevated today in  clinic; however, he has kept an extremely detailed blood pressure log at home and even demonstrated to me how he has been checking his blood pressures at home he has excellent technique.  The values that he has at home, although not perfect, are much better than what we have here.  I am inclined to believe that the blood pressure readings that he is getting at home are more reflective of his true blood pressure. -In the pursuit of normotension, I will increase his spironolactone  to 25 mg daily. -I instructed him to also stop taking his  potassium supplement in anticipation that full dose will increase his potassium level. -Lastly, his kidney function has slowly worsened over the past few months.  I really not sure if this represents progression of CKD or an AKI.  I encouraged him to increase his p.o. water intake over the next few weeks and to recheck his kidney function in 1 week as well as potassium. -If his renal function continues to worsen then I will have to consider scaling back on some of his medications, but we will see if increasing hydration will do the trick first. -Lastly, if his blood pressures remain elevated despite these medicines then I will also think about looking at secondary causes of HTN as well Increase spironolactone  to 25 mg daily Continue amlodipine  10 mg daily Continue chlorthalidone  25 mg daily Continue valsartan  320 mg daily Consider evaluation for secondary causes of HTN next visit Follow-up in 1 month Continue excellent blood pressure checking at home Bilateral carotid artery stenosis - Has bilateral CAS monitor with serial ultrasounds.  Will update ultrasound. Bilateral carotid ultrasound Stage 3 chronic kidney disease, unspecified whether stage 3a or 3b CKD (HCC) - Unclear if this is progression of CKD or an AKI on CKD. - I encouraged adequate hydration as described above. - If creatinine continues to worsen may need to consider RHC to better understand filling pressures. BMP in 1 week  Aortic valve sclerosis - Noted to have aortic valve sclerosis but no stenosis on her last echo. - His murmur certainly sounds like aortic stenosis to me. - I will likely repeat a repeat echo 1 year from his prior for surveillance.          This note was written with the assistance of a dictation microphone or AI dictation software. Please excuse any typos or grammatical errors.   Signed, Xavier Archer, MD 11/22/2024 2:08 PM    [facility]

## 2024-11-23 ENCOUNTER — Ambulatory Visit
Attending: Student in an Organized Health Care Education/Training Program | Admitting: Student in an Organized Health Care Education/Training Program

## 2024-11-23 VITALS — BP 178/62 | HR 71 | Ht 67.0 in | Wt 158.0 lb

## 2024-11-23 DIAGNOSIS — I4891 Unspecified atrial fibrillation: Secondary | ICD-10-CM | POA: Diagnosis not present

## 2024-11-23 DIAGNOSIS — I358 Other nonrheumatic aortic valve disorders: Secondary | ICD-10-CM | POA: Diagnosis not present

## 2024-11-23 DIAGNOSIS — I10 Essential (primary) hypertension: Secondary | ICD-10-CM | POA: Diagnosis not present

## 2024-11-23 DIAGNOSIS — N183 Chronic kidney disease, stage 3 unspecified: Secondary | ICD-10-CM

## 2024-11-23 DIAGNOSIS — I6523 Occlusion and stenosis of bilateral carotid arteries: Secondary | ICD-10-CM

## 2024-11-23 DIAGNOSIS — Z79899 Other long term (current) drug therapy: Secondary | ICD-10-CM

## 2024-11-23 MED ORDER — SPIRONOLACTONE 25 MG PO TABS: 25.0000 mg | ORAL_TABLET | Freq: Every day | ORAL | 3 refills | Status: AC

## 2024-11-23 NOTE — Assessment & Plan Note (Signed)
-   Noted to have aortic valve sclerosis but no stenosis on her last echo. - His murmur certainly sounds like aortic stenosis to me. - I will likely repeat a repeat echo 1 year from his prior for surveillance.

## 2024-11-23 NOTE — Assessment & Plan Note (Signed)
-   Has bilateral CAS monitor with serial ultrasounds.  Will update ultrasound. Bilateral carotid ultrasound

## 2024-11-23 NOTE — Patient Instructions (Signed)
 Medication Instructions:  Your physician has recommended you make the following change in your medication:   ** Begin Spironolactone  25mg  - 1 tablet by mouth daily  ** Stop Potassium  *If you need a refill on your cardiac medications before your next appointment, please call your pharmacy*  Lab Work: BMET in one week If you have labs (blood work) drawn today and your tests are completely normal, you will receive your results only by: MyChart Message (if you have MyChart) OR A paper copy in the mail If you have any lab test that is abnormal or we need to change your treatment, we will call you to review the results.  Testing/Procedures: Your physician has requested that you have a carotid duplex. This test is an ultrasound of the carotid arteries in your neck. It looks at blood flow through these arteries that supply the brain with blood. Allow one hour for this exam. There are no restrictions or special instructions.   Follow-Up: At [facility], you and your health needs are our priority.  As part of our continuing mission to provide you with exceptional heart care, our providers are all part of one team.  This team includes your primary Cardiologist (physician) and Advanced Practice Providers or APPs (Physician Assistants and Nurse Practitioners) who all work together to provide you with the care you need, when you need it.  Your next appointment:   4-6 weeks with Dr Floretta  We recommend signing up for the patient portal called MyChart.  Sign up information is provided on this After Visit Summary.  MyChart is used to connect with patients for Virtual Visits (Telemedicine).  Patients are able to view lab/test results, encounter notes, upcoming appointments, etc.  Non-urgent messages can be sent to your provider as well.   To learn more about what you can do with MyChart, go to [email or web address].

## 2024-11-23 NOTE — Assessment & Plan Note (Signed)
-   Presents today for follow-up of HTN. -His blood pressure is really elevated today in clinic; however, he has kept an extremely detailed blood pressure log at home and even demonstrated to me how he has been checking his blood pressures at home he has excellent technique.  The values that he has at home, although not perfect, are much better than what we have here.  I am inclined to believe that the blood pressure readings that he is getting at home are more reflective of his true blood pressure. -In the pursuit of normotension, I will increase his spironolactone  to 25 mg daily. -I instructed him to also stop taking his potassium supplement in anticipation that full dose will increase his potassium level. -Lastly, his kidney function has slowly worsened over the past few months.  I really not sure if this represents progression of CKD or an AKI.  I encouraged him to increase his p.o. water intake over the next few weeks and to recheck his kidney function in 1 week as well as potassium. -If his renal function continues to worsen then I will have to consider scaling back on some of his medications, but we will see if increasing hydration will do the trick first. -Lastly, if his blood pressures remain elevated despite these medicines then I will also think about looking at secondary causes of HTN as well Increase spironolactone  to 25 mg daily Continue amlodipine  10 mg daily Continue chlorthalidone  25 mg daily Continue valsartan  320 mg daily Consider evaluation for secondary causes of HTN next visit Follow-up in 1 month Continue excellent blood pressure checking at home

## 2024-11-30 ENCOUNTER — Ambulatory Visit (HOSPITAL_COMMUNITY)
Admission: RE | Admit: 2024-11-30 | Discharge: 2024-11-30 | Attending: Student in an Organized Health Care Education/Training Program

## 2024-11-30 ENCOUNTER — Ambulatory Visit: Payer: Self-pay | Admitting: Student in an Organized Health Care Education/Training Program

## 2024-11-30 DIAGNOSIS — I6523 Occlusion and stenosis of bilateral carotid arteries: Secondary | ICD-10-CM

## 2024-11-30 DIAGNOSIS — I10 Essential (primary) hypertension: Secondary | ICD-10-CM | POA: Diagnosis not present

## 2024-11-30 LAB — BASIC METABOLIC PANEL WITH GFR
BUN/Creatinine Ratio: 28 — ABNORMAL HIGH (ref 10–24)
BUN: 48 mg/dL — ABNORMAL HIGH (ref 8–27)
CO2: 22 mmol/L (ref 20–29)
Calcium: 9.9 mg/dL (ref 8.6–10.2)
Chloride: 101 mmol/L (ref 96–106)
Creatinine, Ser: 1.74 mg/dL — ABNORMAL HIGH (ref 0.76–1.27)
Glucose: 122 mg/dL — ABNORMAL HIGH (ref 70–99)
Potassium: 3.9 mmol/L (ref 3.5–5.2)
Sodium: 137 mmol/L (ref 134–144)
eGFR: 38 mL/min/1.73 — ABNORMAL LOW (ref >59)

## 2024-12-02 NOTE — Telephone Encounter (Signed)
 Referral has been placed and advised patient.

## 2024-12-14 ENCOUNTER — Telehealth: Payer: Self-pay | Admitting: Student in an Organized Health Care Education/Training Program

## 2024-12-14 ENCOUNTER — Other Ambulatory Visit: Payer: Self-pay | Admitting: Student in an Organized Health Care Education/Training Program

## 2024-12-14 MED ORDER — AMLODIPINE BESYLATE 10 MG PO TABS: 10.0000 mg | ORAL_TABLET | Freq: Every day | ORAL | 1 refills | Status: AC

## 2024-12-14 NOTE — Telephone Encounter (Signed)
 Refill sent

## 2024-12-14 NOTE — Telephone Encounter (Signed)
 Pt aware referral was placed for [facility] 12/02/24 and they will contact him to be scheduled.  He is concerned about which medication on his current list that Dr Floretta needs to be refilling.  Pt reports he will discuss at his follow up appointment as scheduled.

## 2024-12-14 NOTE — Telephone Encounter (Signed)
 Patient wants a call back to discuss his referral to a nephrologist and he has not heard back from [facility] as yet.

## 2024-12-14 NOTE — Telephone Encounter (Signed)
" °*  STAT* If patient is at the pharmacy, call can be transferred to refill team.   1. Which medications need to be refilled? (please list name of each medication and dose if known)   amLODipine  (NORVASC ) 10 MG tablet   2. Would you like to learn more about the convenience, safety, & potential cost savings by using the [facility]?   3. Are you open to using the [facility] (Type [facility]. ).  4. Which pharmacy/location (including street and city if local pharmacy) is medication to be sent to?  [facility] - [place], [place] - [address]   5. Do they need a 30 day or 90 day supply?    Patient stated he still has medication.  Patient has appointment scheduled with Dr. Floretta on 12/31. "

## 2024-12-22 NOTE — Progress Notes (Signed)
 " Cardiology Office Note:   Date:  12/22/2024  ID:  Kameren, Pargas 1941/07/27, MRN [id number] PCP: Patient, No Pcp Per  [facility] Providers Cardiologist:  Georganna Archer, MD Electrophysiologist:  Will Gladis Norton, MD { Chief Complaint:  No chief complaint on file.     History of Present Illness:   Xavier Bell is a 83 y.o. male with a PMH of CAD s/p CABG, atrial flutter s/p ablation (2015), PAF (on Eliquis ), prior CVA, HTN, HLD, carotid stenosis and prediabetes who presents for follow up.  Patient was previously seen by Dr. Alveta.  Interval History 11/23/24 : - Patient presents today for follow-up of HTN. - I started the patient on spironolactone  last visit. - He has been taking all of his medications as prescribed and has been tolerating the spironolactone  well. - He provided an extremely detailed blood pressure log during today's visit and is SBPs have ranged 129-150 systolic and spironolactone . - No new symptoms or concerns  Interval History 12/23/24: - Patient presents today for follow-up of hypertension.  Seen 1 month ago by me.   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/2018   History of retinal detachment 11/12/2011   HTN (hypertension) 12/31/2011   Hx of detached retina repair 07/22/2023   Hypercoagulable state due to paroxysmal atrial fibrillation (HCC) 04/15/2024   Hypertension    MI (myocardial infarction) (HCC) 1995   Mixed hyperlipidemia 12/31/2016   Pain of lumbar spine 02/13/2018    Paronychia of great toe, left 07/03/2021   Paroxysmal atrial fibrillation (HCC)    Prediabetes 01/01/2022   Primary open angle glaucoma of left eye, moderate stage 09/30/2013   Pseudophakia    IMO SNOMED Dx Update Oct 2024     Retinal detachment    Second degree AV block, Mobitz type II 03/29/2024   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   Vitamin D  deficiency 12/31/2011   IMO SNOMED Dx Update Oct 2024       Studies Reviewed:    EKG: No new ECG       Cardiac Studies & Procedures   ______________________________________________________________________________________________     ECHOCARDIOGRAM  ECHOCARDIOGRAM COMPLETE 03/30/2024  Narrative ECHOCARDIOGRAM REPORT    Patient Name:   Xavier Bell Date of Exam: 03/30/2024 Medical Rec #:  [id number]          Height:       67.0 in Accession #:    [id number]         Weight:       156.1 lb Date of Birth:  06/06/1941           BSA:          1.820 m Patient Age:    83 years           BP:           136/54 mmHg Patient Gender: M                  HR:  43 bpm. Exam Location:  Inpatient  Procedure: 2D Echo, Color Doppler and Cardiac Doppler (Both Spectral and Color Flow Doppler were utilized during procedure).  Indications:    Stroke  History:        Patient has prior history of Echocardiogram examinations, most recent 11/23/2011.  Sonographer:    Eva Lash Referring Phys: [phone] DENISE A WOLFE  IMPRESSIONS   1. Left ventricular ejection fraction, by estimation, is 50%. The left ventricle has low normal function. The left ventricle has no regional wall motion abnormalities. Left ventricular diastolic parameters are indeterminate. 2. Right ventricular systolic function is mildly reduced. The right ventricular size is mildly enlarged. Tricuspid regurgitation signal is inadequate for assessing PA pressure. 3. Left atrial size was mildly dilated. 4. The mitral valve is degenerative. Trivial mitral valve regurgitation. No  evidence of mitral stenosis. 5. The aortic valve is grossly normal. There is mild calcification of the aortic valve. Aortic valve regurgitation is trivial. Aortic valve sclerosis/calcification is present, without any evidence of aortic stenosis. 6. The inferior vena cava is normal in size with greater than 50% respiratory variability, suggesting right atrial pressure of 3 mmHg.  FINDINGS Left Ventricle: Left ventricular ejection fraction, by estimation, is 50%. The left ventricle has low normal function. The left ventricle has no regional wall motion abnormalities. Definity  contrast agent was given IV to delineate the left ventricular endocardial borders. The left ventricular internal cavity size was normal in size. There is no left ventricular hypertrophy. Abnormal (paradoxical) septal motion consistent with post-operative status. Left ventricular diastolic parameters are indeterminate.  Right Ventricle: The right ventricular size is mildly enlarged. No increase in right ventricular wall thickness. Right ventricular systolic function is mildly reduced. Tricuspid regurgitation signal is inadequate for assessing PA pressure.  Left Atrium: Left atrial size was mildly dilated.  Right Atrium: Right atrial size was normal in size.  Pericardium: There is no evidence of pericardial effusion.  Mitral Valve: The mitral valve is degenerative in appearance. Trivial mitral valve regurgitation. No evidence of mitral valve stenosis.  Tricuspid Valve: The tricuspid valve is normal in structure. Tricuspid valve regurgitation is trivial. No evidence of tricuspid stenosis.  Aortic Valve: The aortic valve is grossly normal. There is mild calcification of the aortic valve. Aortic valve regurgitation is trivial. Aortic valve sclerosis/calcification is present, without any evidence of aortic stenosis. Aortic valve mean gradient measures 6.8 mmHg. Aortic valve peak gradient measures 13.8 mmHg. Aortic valve area, by  VTI measures 0.89 cm.  Pulmonic Valve: The pulmonic valve was not well visualized. Pulmonic valve regurgitation is trivial. No evidence of pulmonic stenosis.  Aorta: The aortic root is normal in size and structure.  Venous: The inferior vena cava is normal in size with greater than 50% respiratory variability, suggesting right atrial pressure of 3 mmHg.  IAS/Shunts: The interatrial septum was not well visualized.   LEFT VENTRICLE PLAX 2D LVIDd:         4.20 cm      Diastology LVIDs:         3.80 cm      LV e' medial:    5.00 cm/s LV PW:         1.30 cm      LV E/e' medial:  24.2 LV IVS:        1.30 cm      LV e' lateral:   14.30 cm/s LVOT diam:     1.70 cm      LV E/e' lateral: 8.5 LV SV:  35 LV SV Index:   19 LVOT Area:     2.27 cm  LV Volumes (MOD) LV vol d, MOD A2C: 182.0 ml LV vol d, MOD A4C: 135.0 ml LV vol s, MOD A2C: 90.5 ml LV vol s, MOD A4C: 73.4 ml LV SV MOD A2C:     91.5 ml LV SV MOD A4C:     135.0 ml LV SV MOD BP:      73.9 ml  RIGHT VENTRICLE RV S prime:     9.46 cm/s TAPSE (M-mode): 1.2 cm  LEFT ATRIUM             Index LA diam:        4.60 cm 2.53 cm/m LA Vol (A2C):   62.9 ml 34.56 ml/m LA Vol (A4C):   68.4 ml 37.58 ml/m LA Biplane Vol: 66.6 ml 36.59 ml/m AORTIC VALVE AV Area (Vmax):    1.02 cm AV Area (Vmean):   0.88 cm AV Area (VTI):     0.89 cm AV Vmax:           185.84 cm/s AV Vmean:          113.522 cm/s AV VTI:            0.400 m AV Peak Grad:      13.8 mmHg AV Mean Grad:      6.8 mmHg LVOT Vmax:         83.50 cm/s LVOT Vmean:        44.100 cm/s LVOT VTI:          0.156 m LVOT/AV VTI ratio: 0.39  AORTA Ao Asc diam: 3.40 cm  MV E velocity: 121.00 cm/s SHUNTS Systemic VTI:  0.16 m Systemic Diam: 1.70 cm  Soyla Merck MD Electronically signed by Soyla Merck MD Signature Date/Time: 03/30/2024/12:42:44 PM    Final    MONITORS  LONG TERM MONITOR (3-14 DAYS) 05/07/2024  Narrative Patch Wear Time:  13 days  and 17 hours  HR 21 - 154, average 51 bpm. Atrial fibrillation detected. 74% atrial fibrillation burden, longest 6 days 16 hours with average heart rate of 50 bpm Rare supraventricular ectopy. Rare ventricular ectopy. 1155 pauses, longest 5.1 seconds, all between 10 PM and 3 AM 1 episode complete heart block lasting 12 seconds at 11 PM No symptom trigger episodes.   [facility] Cardiac Electrophysiology       ______________________________________________________________________________________________      Risk Assessment/Calculations:           Physical Exam:     VS:  There were no vitals taken for this visit.     Wt Readings from Last 3 Encounters:  11/23/24 158 lb (71.7 kg)  10/22/24 161 lb (73 kg)  09/11/24 157 lb 3.2 oz (71.3 kg)     GEN: Well nourished, well developed, in no acute distress NECK: No JVD; No carotid bruits CARDIAC: RRR, II/VI systolic murmur at RUSB, no rubs or gallops RESPIRATORY:  Clear to auscultation without rales, wheezing or rhonchi  ABDOMEN: Soft, non-tender, non-distended, normal bowel sounds EXTREMITIES:  Warm and well perfused, no edema; No deformity, 2+ radial pulses PSYCH: Normal mood and affect   Assessment & Plan           This note was written with the assistance of a dictation microphone or AI dictation software. Please excuse any typos or grammatical errors.   Signed, Georganna Archer, MD 12/22/2024 11:15 PM    [facility]  "

## 2024-12-23 ENCOUNTER — Encounter: Payer: Self-pay | Admitting: Student in an Organized Health Care Education/Training Program

## 2024-12-23 ENCOUNTER — Ambulatory Visit: Attending: Internal Medicine | Admitting: Student in an Organized Health Care Education/Training Program

## 2024-12-23 VITALS — BP 157/61 | HR 67 | Ht 67.0 in | Wt 155.4 lb

## 2024-12-23 DIAGNOSIS — I480 Paroxysmal atrial fibrillation: Secondary | ICD-10-CM

## 2024-12-23 DIAGNOSIS — Z8673 Personal history of transient ischemic attack (TIA), and cerebral infarction without residual deficits: Secondary | ICD-10-CM | POA: Diagnosis not present

## 2024-12-23 DIAGNOSIS — I10 Essential (primary) hypertension: Secondary | ICD-10-CM

## 2024-12-23 DIAGNOSIS — Z79899 Other long term (current) drug therapy: Secondary | ICD-10-CM

## 2024-12-23 DIAGNOSIS — N189 Chronic kidney disease, unspecified: Secondary | ICD-10-CM

## 2024-12-23 MED ORDER — APIXABAN 2.5 MG PO TABS: 2.5000 mg | ORAL_TABLET | Freq: Two times a day (BID) | ORAL | 3 refills | Status: AC

## 2024-12-23 NOTE — Assessment & Plan Note (Signed)
-   BP is much improved albeit not perfect. -Given that his renal function has steadily climbed, I am inclined to not make any additional changes to his blood pressure regimen at this time until he is evaluated by his nephrologist. -The patient states that he has an appointment with nephrology on 1/29 with [facility]. -I will await the results of this conversation before making any additional changes.  Needless to say he has had a substantial improvement in his blood pressure over the past 2 months. Continue spironolactone  25 mg daily Continue amlodipine  10 mg daily Continue chlorthalidone  25 mg daily Continue valsartan  320 mg daily Consider evaluation for secondary causes of HTN next visit Follow-up in 3 months

## 2024-12-23 NOTE — Patient Instructions (Signed)
 Medication Instructions:  DECREASE Eliquis  2.5 mg twice daily   *If you need a refill on your cardiac medications before your next appointment, please call your pharmacy*  PLEASE CONTACT DR. FLORETTA AFTER SEEING KIDNEY SPECIALIST TO ADVISE WHO YOUR KIDNEY DOCTOR IS.   Follow-Up: At [facility], you and your health needs are our priority.  As part of our continuing mission to provide you with exceptional heart care, our providers are all part of one team.  This team includes your primary Cardiologist (physician) and Advanced Practice Providers or APPs (Physician Assistants and Nurse Practitioners) who all work together to provide you with the care you need, when you need it.  Your next appointment:   3 month(s)  Provider:   Georganna Floretta, MD    We recommend signing up for the patient portal called MyChart.  Sign up information is provided on this After Visit Summary.  MyChart is used to connect with patients for Virtual Visits (Telemedicine).  Patients are able to view lab/test results, encounter notes, upcoming appointments, etc.  Non-urgent messages can be sent to your provider as well.   To learn more about what you can do with MyChart, go to [email or web address].

## 2024-12-29 ENCOUNTER — Ambulatory Visit

## 2024-12-29 ENCOUNTER — Other Ambulatory Visit: Payer: Self-pay

## 2024-12-30 MED ORDER — FENOFIBRATE 145 MG PO TABS: 145.0000 mg | ORAL_TABLET | Freq: Every day | ORAL | 3 refills | Status: AC

## 2025-01-02 ENCOUNTER — Encounter: Payer: Self-pay | Admitting: Student in an Organized Health Care Education/Training Program

## 2025-01-21 ENCOUNTER — Other Ambulatory Visit: Payer: Self-pay | Admitting: Student in an Organized Health Care Education/Training Program

## 2025-01-21 LAB — HEMOGLOBIN A1C
Est. average glucose Bld gHb Est-mCnc: 120 mg/dL
Hgb A1c MFr Bld: 5.8 % — ABNORMAL HIGH (ref 4.8–5.6)

## 2025-01-22 ENCOUNTER — Ambulatory Visit: Payer: Self-pay | Admitting: Student in an Organized Health Care Education/Training Program

## 2025-03-08 ENCOUNTER — Ambulatory Visit: Admitting: Student in an Organized Health Care Education/Training Program

## 2025-03-12 ENCOUNTER — Ambulatory Visit (HOSPITAL_COMMUNITY): Admitting: Internal Medicine
